# Patient Record
Sex: Female | Born: 1985 | Race: White | Hispanic: No | Marital: Single | Smoking: Former smoker
Health system: Southern US, Community
[De-identification: ages and names within clinical notes are randomized; demographics above are authoritative.]

## PROBLEM LIST (undated history)

## (undated) DIAGNOSIS — Z8679 Personal history of other diseases of the circulatory system: Secondary | ICD-10-CM

## (undated) DIAGNOSIS — R87629 Unspecified abnormal cytological findings in specimens from vagina: Secondary | ICD-10-CM

## (undated) DIAGNOSIS — M5117 Intervertebral disc disorders with radiculopathy, lumbosacral region: Secondary | ICD-10-CM

## (undated) DIAGNOSIS — O24419 Gestational diabetes mellitus in pregnancy, unspecified control: Secondary | ICD-10-CM

## (undated) DIAGNOSIS — F319 Bipolar disorder, unspecified: Secondary | ICD-10-CM

## (undated) DIAGNOSIS — F419 Anxiety disorder, unspecified: Secondary | ICD-10-CM

## (undated) DIAGNOSIS — J45909 Unspecified asthma, uncomplicated: Secondary | ICD-10-CM

## (undated) DIAGNOSIS — M9901 Segmental and somatic dysfunction of cervical region: Secondary | ICD-10-CM

## (undated) DIAGNOSIS — Z8759 Personal history of other complications of pregnancy, childbirth and the puerperium: Secondary | ICD-10-CM

## (undated) DIAGNOSIS — I83819 Varicose veins of unspecified lower extremities with pain: Secondary | ICD-10-CM

## (undated) HISTORY — DX: Intervertebral disc disorders with radiculopathy, lumbosacral region: M51.17

## (undated) HISTORY — DX: Segmental and somatic dysfunction of cervical region: M99.01

## (undated) HISTORY — DX: Unspecified asthma, uncomplicated: J45.909

## (undated) HISTORY — DX: Bipolar disorder, unspecified: F31.9

## (undated) HISTORY — DX: Unspecified abnormal cytological findings in specimens from vagina: R87.629

## (undated) HISTORY — DX: Varicose veins of unspecified lower extremity with pain: I83.819

## (undated) HISTORY — DX: Anxiety disorder, unspecified: F41.9

## (undated) HISTORY — PX: LEEP: SHX91

---
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
## 2008-01-21 ENCOUNTER — Other Ambulatory Visit: Admission: RE | Admit: 2008-01-21 | Discharge: 2008-01-21 | Payer: Self-pay | Admitting: Unknown Physician Specialty

## 2008-01-21 ENCOUNTER — Encounter (INDEPENDENT_AMBULATORY_CARE_PROVIDER_SITE_OTHER): Payer: Self-pay | Admitting: Unknown Physician Specialty

## 2008-10-20 ENCOUNTER — Other Ambulatory Visit: Admission: RE | Admit: 2008-10-20 | Discharge: 2008-10-20 | Payer: Self-pay | Admitting: Unknown Physician Specialty

## 2008-10-20 ENCOUNTER — Encounter (INDEPENDENT_AMBULATORY_CARE_PROVIDER_SITE_OTHER): Payer: Self-pay | Admitting: Unknown Physician Specialty

## 2009-02-20 ENCOUNTER — Emergency Department (HOSPITAL_COMMUNITY): Admission: EM | Admit: 2009-02-20 | Discharge: 2009-02-20 | Payer: Self-pay | Admitting: Emergency Medicine

## 2009-04-12 ENCOUNTER — Ambulatory Visit (HOSPITAL_COMMUNITY): Admission: RE | Admit: 2009-04-12 | Discharge: 2009-04-12 | Payer: Self-pay | Admitting: Pulmonary Disease

## 2009-11-14 ENCOUNTER — Emergency Department (HOSPITAL_COMMUNITY): Admission: EM | Admit: 2009-11-14 | Discharge: 2009-11-14 | Payer: Self-pay | Admitting: Emergency Medicine

## 2010-01-30 IMAGING — CR DG TOE GREAT 2+V*L*
1 series · 1 of 1 positions shown · non-contrast
Comparison: None available.

CLINICAL DATA: Fall, pain.

LEFT TOE - 2+ VIEW

[view not recorded]
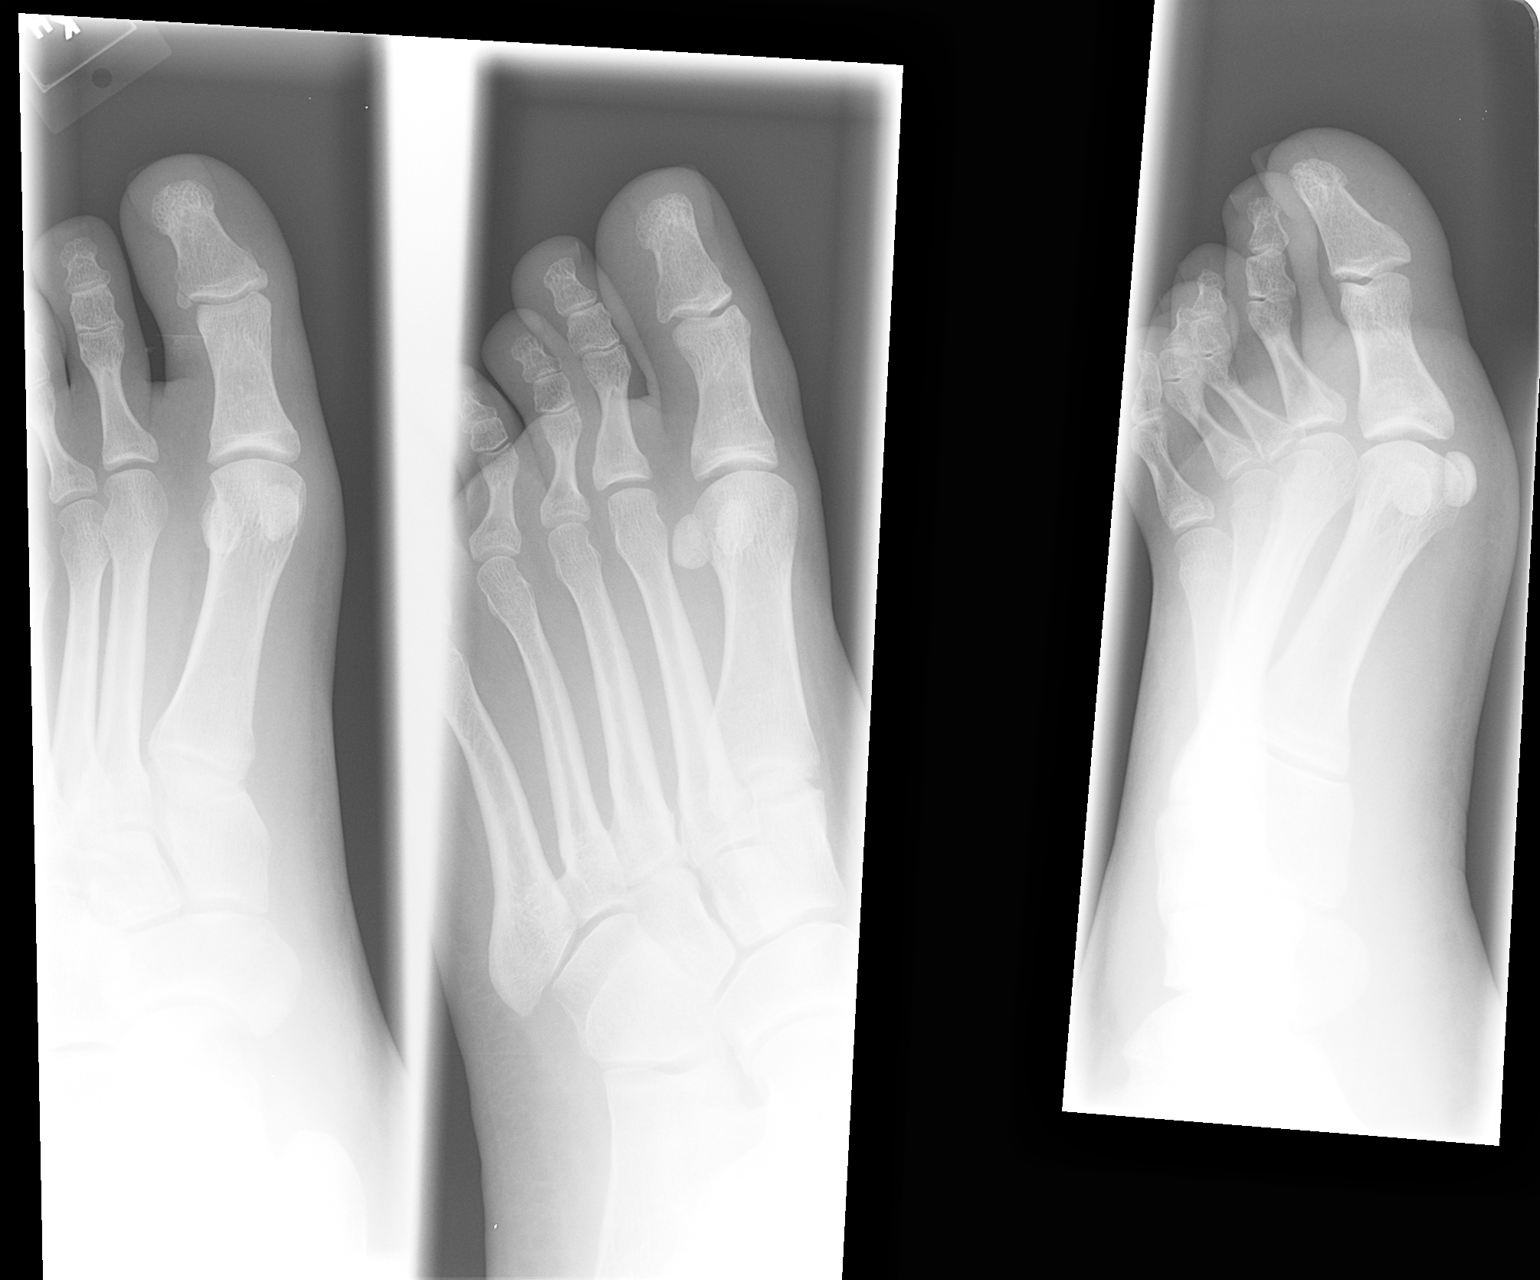

[1 of 1 positions shown; findings below may reference images not displayed]

FINDINGS: There is a nondisplaced fracture of the base of the
distal phalanx of the great toe on the lateral side.  The fracture
involves a small corner of the proximal phalanx.  No other acute
bony or joint abnormality.
IMPRESSION: Nondisplaced fracture lateral corner base of the proximal phalanx
of the great toe.

## 2010-04-25 ENCOUNTER — Emergency Department (HOSPITAL_COMMUNITY): Admission: EM | Admit: 2010-04-25 | Discharge: 2010-04-25 | Payer: Self-pay | Admitting: Emergency Medicine

## 2010-09-13 ENCOUNTER — Emergency Department (HOSPITAL_COMMUNITY)
Admission: EM | Admit: 2010-09-13 | Discharge: 2010-09-13 | Disposition: A | Discharge status: Home / Self Care | Payer: Self-pay | Attending: Emergency Medicine | Admitting: Emergency Medicine

## 2010-09-13 DIAGNOSIS — L299 Pruritus, unspecified: Secondary | ICD-10-CM | POA: Insufficient documentation

## 2010-09-13 DIAGNOSIS — B86 Scabies: Secondary | ICD-10-CM | POA: Insufficient documentation

## 2014-01-13 ENCOUNTER — Encounter (HOSPITAL_COMMUNITY): Payer: Self-pay | Admitting: Emergency Medicine

## 2014-01-13 ENCOUNTER — Emergency Department (HOSPITAL_COMMUNITY)
Admission: EM | Admit: 2014-01-13 | Discharge: 2014-01-13 | Disposition: A | Discharge status: Home / Self Care | Payer: Medicaid Other | Attending: Emergency Medicine | Admitting: Emergency Medicine

## 2014-01-13 DIAGNOSIS — IMO0002 Reserved for concepts with insufficient information to code with codable children: Secondary | ICD-10-CM | POA: Diagnosis not present

## 2014-01-13 DIAGNOSIS — A599 Trichomoniasis, unspecified: Secondary | ICD-10-CM

## 2014-01-13 DIAGNOSIS — F17200 Nicotine dependence, unspecified, uncomplicated: Secondary | ICD-10-CM | POA: Diagnosis not present

## 2014-01-13 DIAGNOSIS — Z87828 Personal history of other (healed) physical injury and trauma: Secondary | ICD-10-CM | POA: Insufficient documentation

## 2014-01-13 DIAGNOSIS — N730 Acute parametritis and pelvic cellulitis: Secondary | ICD-10-CM

## 2014-01-13 DIAGNOSIS — M5416 Radiculopathy, lumbar region: Secondary | ICD-10-CM

## 2014-01-13 DIAGNOSIS — Z792 Long term (current) use of antibiotics: Secondary | ICD-10-CM | POA: Diagnosis not present

## 2014-01-13 DIAGNOSIS — Z79899 Other long term (current) drug therapy: Secondary | ICD-10-CM | POA: Diagnosis not present

## 2014-01-13 DIAGNOSIS — N739 Female pelvic inflammatory disease, unspecified: Secondary | ICD-10-CM | POA: Insufficient documentation

## 2014-01-13 DIAGNOSIS — Z3202 Encounter for pregnancy test, result negative: Secondary | ICD-10-CM | POA: Diagnosis not present

## 2014-01-13 DIAGNOSIS — M549 Dorsalgia, unspecified: Secondary | ICD-10-CM | POA: Diagnosis present

## 2014-01-13 LAB — URINALYSIS, ROUTINE W REFLEX MICROSCOPIC
BILIRUBIN URINE: NEGATIVE
Glucose, UA: NEGATIVE mg/dL
KETONES UR: NEGATIVE mg/dL
NITRITE: NEGATIVE
Protein, ur: 30 mg/dL — AB
UROBILINOGEN UA: 0.2 mg/dL (ref 0.0–1.0)
pH: 6 (ref 5.0–8.0)

## 2014-01-13 LAB — URINE MICROSCOPIC-ADD ON

## 2014-01-13 LAB — WET PREP, GENITAL: YEAST WET PREP: NONE SEEN

## 2014-01-13 LAB — PREGNANCY, URINE: PREG TEST UR: NEGATIVE

## 2014-01-13 MED ORDER — METRONIDAZOLE 500 MG PO TABS: 500.0000 mg | ORAL_TABLET | Freq: Two times a day (BID) | ORAL | Status: DC

## 2014-01-13 MED ORDER — AZITHROMYCIN 1 G PO PACK
2.0000 g | PACK | Freq: Once | ORAL | Status: AC
  Administered 2014-01-13: 2 g via ORAL
  Filled 2014-01-13: qty 2

## 2014-01-13 MED ORDER — DOXYCYCLINE HYCLATE 100 MG PO CAPS: 100.0000 mg | ORAL_CAPSULE | Freq: Two times a day (BID) | ORAL | Status: DC

## 2014-01-13 MED ORDER — ONDANSETRON 8 MG PO TBDP
8.0000 mg | ORAL_TABLET | Freq: Once | ORAL | Status: AC
  Administered 2014-01-13: 8 mg via ORAL
  Filled 2014-01-13: qty 1

## 2014-01-13 MED ORDER — METRONIDAZOLE 500 MG PO TABS
2000.0000 mg | ORAL_TABLET | Freq: Once | ORAL | Status: AC
  Administered 2014-01-13: 2000 mg via ORAL
  Filled 2014-01-13: qty 4
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
## 2014-01-13 NOTE — Discharge Instructions (Signed)
SEEK IMMEDIATE MEDICAL ATTENTION IF: New numbness, tingling, weakness, or problem with the use of your arms or legs.  Severe back pain not relieved with medications.  Change in bowel or bladder control.  Increasing pain in any areas of the body (such as chest or abdominal pain).  Shortness of breath, dizziness or fainting.  Nausea (feeling sick to your stomach), vomiting, fever, or sweats.  Consider filling your prescription and using your Ultram as previously directed by your doctor.

## 2014-01-15 LAB — GC/CHLAMYDIA PROBE AMP
CT Probe RNA: NEGATIVE
GC PROBE AMP APTIMA: NEGATIVE

## 2014-06-08 ENCOUNTER — Other Ambulatory Visit: Payer: Self-pay | Admitting: Obstetrics and Gynecology

## 2014-06-08 DIAGNOSIS — O3680X0 Pregnancy with inconclusive fetal viability, not applicable or unspecified: Secondary | ICD-10-CM

## 2014-06-09 ENCOUNTER — Ambulatory Visit (INDEPENDENT_AMBULATORY_CARE_PROVIDER_SITE_OTHER): Payer: Medicaid Other

## 2014-06-09 ENCOUNTER — Other Ambulatory Visit: Payer: Self-pay | Admitting: Obstetrics and Gynecology

## 2014-06-09 DIAGNOSIS — Z3687 Encounter for antenatal screening for uncertain dates: Secondary | ICD-10-CM

## 2014-06-09 DIAGNOSIS — O3680X0 Pregnancy with inconclusive fetal viability, not applicable or unspecified: Secondary | ICD-10-CM

## 2014-06-09 DIAGNOSIS — Z36 Encounter for antenatal screening of mother: Secondary | ICD-10-CM

## 2014-06-09 NOTE — Progress Notes (Signed)
U/S-single IUP with +FCA noted, FHR-124 bpm, CRL c/w 6+2wks EDD 01/31/2015, cx appears closed, bilateral adnexa appears WNL

## 2014-06-23 ENCOUNTER — Encounter: Payer: Medicaid Other | Admitting: Women's Health

## 2014-07-02 ENCOUNTER — Encounter: Payer: Medicaid Other | Admitting: Advanced Practice Midwife

## 2014-07-14 ENCOUNTER — Encounter: Payer: Medicaid Other | Admitting: Women's Health

## 2014-07-28 ENCOUNTER — Encounter: Payer: Medicaid Other | Admitting: Women's Health

## 2014-07-28 ENCOUNTER — Encounter: Payer: Self-pay | Admitting: Women's Health

## 2016-07-06 ENCOUNTER — Encounter: Payer: Self-pay | Admitting: Vascular Surgery

## 2016-07-11 ENCOUNTER — Encounter: Payer: Self-pay | Admitting: Surgery

## 2016-07-17 ENCOUNTER — Ambulatory Visit (INDEPENDENT_AMBULATORY_CARE_PROVIDER_SITE_OTHER): Payer: Medicaid Other | Admitting: Vascular Surgery

## 2016-07-17 ENCOUNTER — Encounter: Payer: Self-pay | Admitting: Vascular Surgery

## 2016-07-17 VITALS — BP 129/88 | HR 75 | Temp 97.6°F | Resp 18 | Ht 67.0 in | Wt 298.0 lb

## 2016-07-17 DIAGNOSIS — I83892 Varicose veins of left lower extremities with other complications: Secondary | ICD-10-CM
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
## 2016-07-21 NOTE — Addendum Note (Signed)
Addended by: Burton ApleyPETTY, Patrice Matthew A on: 07/21/2016 12:08 PM   Modules accepted: Orders

## 2016-10-05 ENCOUNTER — Encounter: Payer: Self-pay | Admitting: Vascular Surgery

## 2016-10-17 ENCOUNTER — Encounter: Payer: Self-pay | Admitting: Vascular Surgery

## 2016-10-17 ENCOUNTER — Ambulatory Visit (INDEPENDENT_AMBULATORY_CARE_PROVIDER_SITE_OTHER): Payer: Medicaid Other | Admitting: Vascular Surgery

## 2016-10-17 ENCOUNTER — Ambulatory Visit (HOSPITAL_COMMUNITY)
Admission: RE | Admit: 2016-10-17 | Discharge: 2016-10-17 | Disposition: A | Discharge status: Home / Self Care | Payer: Medicaid Other | Source: Ambulatory Visit | Attending: Vascular Surgery | Admitting: Vascular Surgery

## 2016-10-17 VITALS — BP 115/76 | HR 84 | Temp 98.2°F | Resp 16 | Ht 68.0 in | Wt 299.0 lb

## 2016-10-17 DIAGNOSIS — I83892 Varicose veins of left lower extremities with other complications: Secondary | ICD-10-CM
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
## 2016-10-19 ENCOUNTER — Other Ambulatory Visit: Payer: Self-pay | Admitting: *Deleted

## 2016-10-19 DIAGNOSIS — I83892 Varicose veins of left lower extremities with other complications: Secondary | ICD-10-CM

## 2016-11-10 ENCOUNTER — Encounter: Payer: Self-pay | Admitting: Vascular Surgery

## 2016-11-15 ENCOUNTER — Encounter: Payer: Self-pay | Admitting: Vascular Surgery

## 2016-11-21 ENCOUNTER — Encounter: Payer: Self-pay | Admitting: Vascular Surgery

## 2016-11-21 ENCOUNTER — Ambulatory Visit (INDEPENDENT_AMBULATORY_CARE_PROVIDER_SITE_OTHER): Payer: Medicaid Other | Admitting: Vascular Surgery

## 2016-11-21 VITALS — BP 115/78 | HR 80 | Temp 97.8°F | Resp 16 | Ht 68.0 in | Wt 299.0 lb

## 2016-11-21 DIAGNOSIS — I83892 Varicose veins of left lower extremities with other complications: Secondary | ICD-10-CM

## 2016-11-21 HISTORY — PX: ENDOVENOUS ABLATION SAPHENOUS VEIN W/ LASER: SUR449

## 2016-11-21 NOTE — Progress Notes (Signed)
Laser Ablation Procedure    Date: 11/21/2016   Summer Terrell DOB:11/23/1985  Consent signed: Yes    Surgeon:  Dr. Quita SkyeJames D. Hart RochesterLawson  Procedure: Laser Ablation: left Greater Saphenous Vein  BP 115/78 (BP Location: Left Arm, Patient Position: Sitting, Cuff Size: Large)   Pulse 80   Temp 97.8 F (36.6 C) (Oral)   Resp 16   Ht 5\' 8"  (1.727 m)   Wt 299 lb (135.6 kg)   SpO2 96%   BMI 45.46 kg/m   Tumescent Anesthesia: 500 cc 0.9% NaCl with 50 cc Bupivacaine 0.5% HCL  and 15 cc 8.4% NaHCO3  Local Anesthesia: 25 cc Bupivacaine 0.5 % HCL  Pulsed Mode: 15 watts, 500ms delay, 1.0 duration  Total Energy: 2565 Joules             Total Pulses:  173              Total Time: 2:51    Stab Phlebectomy: >20 Sites: Thigh and Calf  Patient tolerated procedure well    Description of Procedure:  After marking the course of the secondary varicosities, the patient was placed on the operating table in the supine position, and the left leg was prepped and draped in sterile fashion.   Local anesthetic was administered and under ultrasound guidance the saphenous vein was accessed with a micro needle and guide wire; then the mirco puncture sheath was placed.  A guide wire was inserted saphenofemoral junction , followed by a 5 french sheath.  The position of the sheath and then the laser fiber below the junction was confirmed using the ultrasound.  Tumescent anesthesia was administered along the course of the saphenous vein using ultrasound guidance. The patient was placed in Trendelenburg position and protective laser glasses were placed on patient and staff, and the laser was fired at 15 watts continuous mode advancing 1-592mm/second for a total of 2565 joules.   For stab phlebectomies, local anesthetic was administered at the previously marked varicosities, and tumescent anesthesia was administered around the vessels.  Greater than 20 stab wounds were made using the tip of an 11 blade. And using the vein  hook, the phlebectomies were performed using a hemostat to avulse the varicosities.  Adequate hemostasis was achieved.     Steri strips were applied to the stab wounds and ABD pads and thigh high compression stockings were applied.  Ace wrap bandages were applied over the phlebectomy sites and at the top of the saphenofemoral junction. Blood loss was less than 15 cc.  The patient ambulated out of the operating room having tolerated the procedure well.

## 2016-11-21 NOTE — Progress Notes (Signed)
Subjective:     Patient ID: Cottie BandaAllyson L Abalos, female   DOB: 10/31/1985, 31 y.o.   MRN: [id number]  HPI This 31 year old female had laser ablation left great saphenous vein from the proximal calf to near the saphenofemoral junction plus greater than 20 stab phlebectomy of painful varicosities performed under local tumescent anesthesia. A total of 2565 J of energy was utilized. She tolerated the procedures well.  Review of Systems     Objective:   Physical Exam BP 115/78 (BP Location: Left Arm, Patient Position: Sitting, Cuff Size: Large)   Pulse 80   Temp 97.8 F (36.6 C) (Oral)   Resp 16   Ht 5\' 8"  (1.727 m)   Wt 299 lb (135.6 kg)   SpO2 96%   BMI 45.46 kg/m        Assessment:     Well-tolerated laser ablation left great saphenous vein plus greater than 20 stab phlebectomy of painful varicosities performed under local tumescent anesthesia    Plan:     Return in 1 week for venous duplex exam to confirm closure left great saphenous vein

## 2016-11-28 ENCOUNTER — Ambulatory Visit (INDEPENDENT_AMBULATORY_CARE_PROVIDER_SITE_OTHER): Payer: Self-pay | Admitting: Vascular Surgery

## 2016-11-28 ENCOUNTER — Encounter: Payer: Self-pay | Admitting: Vascular Surgery

## 2016-11-28 ENCOUNTER — Ambulatory Visit (HOSPITAL_COMMUNITY)
Admission: RE | Admit: 2016-11-28 | Discharge: 2016-11-28 | Disposition: A | Discharge status: Home / Self Care | Payer: Medicaid Other | Source: Ambulatory Visit | Attending: Vascular Surgery | Admitting: Vascular Surgery

## 2016-11-28 VITALS — BP 104/62 | HR 87 | Temp 97.1°F | Resp 18 | Ht 68.0 in | Wt 299.0 lb

## 2016-11-28 DIAGNOSIS — I82812 Embolism and thrombosis of superficial veins of left lower extremities: Secondary | ICD-10-CM | POA: Insufficient documentation

## 2016-11-28 DIAGNOSIS — I83892 Varicose veins of left lower extremities with other complications: Secondary | ICD-10-CM | POA: Diagnosis not present

## 2016-11-28 NOTE — Progress Notes (Signed)
Subjective:     Patient ID: Summer Terrell, female   DOB: 11/09/1985, 31 y.o.   MRN: [id number]  HPI This 31 year old female returns 1 week post-laser ablation left great saphenous vein plus greater than 20 stab phlebectomy of painful varicosities for gross reflux with pain. She has had some mild-to-moderate discomfort in the distal thigh medially over the great saphenous vein. She has had no chest pain, shortness of breath, hemoptysis, or other pulmonary symptoms. She has worn her elastic compression stocking although it is irritated her proximal thigh area because of the size of her leg. She's had no change in distal edema. She did take ibuprofen as instructed.  Review of Systems     Objective:   Physical Exam BP 104/62 (BP Location: Left Arm, Patient Position: Sitting, Cuff Size: Large)   Pulse 87   Temp 97.1 F (36.2 C) (Oral)   Resp 18   Ht 5\' 8"  (1.727 m)   Wt 299 lb (135.6 kg)   SpO2 96%   BMI 45.46 kg/m   Gen. well-developed well-nourished female no apparent distress alert and oriented 3 Lungs no rhonchi or wheezing Cardiovascular regular rhythm no murmurs Left leg with Steri-Strips in place over multiple stab phlebectomy sites and thigh and calf. All healing nicely. No distal edema noted. 2+ dorsalis pedis pulse palpable. Mild tenderness to deep palpation over great saphenous vein in mid to distal thigh.  Today I ordered a venous duplex exam the left leg which I reviewed and interpreted. There is no DVT. There is total closure of the left great saphenous vein from the proximal calf to near the saphenofemoral junction     Assessment:     Successful laser ablation left great saphenous vein for gross reflux with painful varicosities plus multiple stab phlebectomy of painful varicosities with excellent early results and no complaints    Plan:     Patient return to see me on a when necessary basis Will wear elastic compression stockings for 1 more week

## 2016-11-29 ENCOUNTER — Telehealth: Payer: Self-pay | Admitting: *Deleted

## 2016-11-29 NOTE — Telephone Encounter (Signed)
I cannot due to her large pain medication and anxiety meds.

## 2016-11-30 NOTE — Telephone Encounter (Signed)
Patient aware and states she seen [facility] before. States her doses have been lowered. Norco 10-325 110 pills and month and xanax 1mg  60 a month. Please advise.

## 2016-12-04 NOTE — Telephone Encounter (Signed)
They will have to go lower, or has to go to psychiatry for the anxiety meds or pain center for chronic pain meds.

## 2016-12-19 NOTE — Telephone Encounter (Signed)
Detailed message left for patient to call us back if she still wants to discuss being seen.

## 2016-12-29 ENCOUNTER — Emergency Department (HOSPITAL_COMMUNITY)
Admission: EM | Admit: 2016-12-29 | Discharge: 2016-12-29 | Disposition: A | Discharge status: Home / Self Care | Payer: Medicaid Other | Attending: Emergency Medicine | Admitting: Emergency Medicine

## 2016-12-29 ENCOUNTER — Encounter (HOSPITAL_COMMUNITY): Payer: Self-pay | Admitting: Emergency Medicine

## 2016-12-29 DIAGNOSIS — J45909 Unspecified asthma, uncomplicated: Secondary | ICD-10-CM | POA: Diagnosis not present

## 2016-12-29 DIAGNOSIS — F17200 Nicotine dependence, unspecified, uncomplicated: Secondary | ICD-10-CM | POA: Insufficient documentation

## 2016-12-29 DIAGNOSIS — N760 Acute vaginitis: Secondary | ICD-10-CM | POA: Diagnosis not present

## 2016-12-29 DIAGNOSIS — R300 Dysuria: Secondary | ICD-10-CM

## 2016-12-29 DIAGNOSIS — Z79899 Other long term (current) drug therapy: Secondary | ICD-10-CM | POA: Insufficient documentation

## 2016-12-29 DIAGNOSIS — B9689 Other specified bacterial agents as the cause of diseases classified elsewhere: Secondary | ICD-10-CM

## 2016-12-29 LAB — URINALYSIS, ROUTINE W REFLEX MICROSCOPIC
BILIRUBIN URINE: NEGATIVE
GLUCOSE, UA: NEGATIVE mg/dL
HGB URINE DIPSTICK: NEGATIVE
KETONES UR: NEGATIVE mg/dL
Leukocytes, UA: NEGATIVE
NITRITE: NEGATIVE
PH: 6 (ref 5.0–8.0)
Protein, ur: NEGATIVE mg/dL
Specific Gravity, Urine: 1.006 (ref 1.005–1.030)

## 2016-12-29 LAB — WET PREP, GENITAL
Sperm: NONE SEEN
Trich, Wet Prep: NONE SEEN
Yeast Wet Prep HPF POC: NONE SEEN

## 2016-12-29 MED ORDER — METRONIDAZOLE 0.75 % EX GEL: CUTANEOUS | 0 refills | Status: DC

## 2016-12-29 NOTE — Discharge Instructions (Signed)
Your vital signs within normal limits. Your urine analysis test is negative for urinary tract infection, or kidney stones. Your wet prep suggest bacterial vaginosis. Please use metronidazole gel at bedtime for the next 5 nights. Please refrain from all sexual activity over the next 5 nights.
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
## 2016-12-29 NOTE — ED Triage Notes (Signed)
Patient complaining of burning with urination and vaginal irritation x 2 days.

## 2017-01-01 LAB — GC/CHLAMYDIA PROBE AMP (~~LOC~~) NOT AT ARMC
Chlamydia: NEGATIVE
NEISSERIA GONORRHEA: NEGATIVE

## 2020-09-24 ENCOUNTER — Other Ambulatory Visit: Payer: Self-pay

## 2020-09-24 ENCOUNTER — Ambulatory Visit (INDEPENDENT_AMBULATORY_CARE_PROVIDER_SITE_OTHER): Payer: Medicaid Other | Admitting: Adult Health

## 2020-09-24 ENCOUNTER — Encounter: Payer: Self-pay | Admitting: Adult Health

## 2020-09-24 VITALS — BP 114/70 | HR 80 | Ht 66.0 in | Wt 296.0 lb

## 2020-09-24 DIAGNOSIS — Z3201 Encounter for pregnancy test, result positive: Secondary | ICD-10-CM | POA: Diagnosis not present

## 2020-09-24 DIAGNOSIS — Z3A01 Less than 8 weeks gestation of pregnancy: Secondary | ICD-10-CM | POA: Insufficient documentation

## 2020-09-24 DIAGNOSIS — N926 Irregular menstruation, unspecified: Secondary | ICD-10-CM | POA: Diagnosis not present

## 2020-09-24 DIAGNOSIS — R110 Nausea: Secondary | ICD-10-CM

## 2020-09-24 DIAGNOSIS — O3680X0 Pregnancy with inconclusive fetal viability, not applicable or unspecified: Secondary | ICD-10-CM | POA: Diagnosis not present

## 2020-09-24 DIAGNOSIS — Z98891 History of uterine scar from previous surgery: Secondary | ICD-10-CM

## 2020-09-24 LAB — POCT URINE PREGNANCY: Preg Test, Ur: POSITIVE — AB

## 2020-09-24 MED ORDER — PROMETHAZINE HCL 25 MG PO TABS: 25.0000 mg | ORAL_TABLET | Freq: Four times a day (QID) | ORAL | 1 refills | Status: DC | PRN

## 2020-09-24 NOTE — Progress Notes (Signed)
  Subjective:     Patient ID: Summer Terrell, female   DOB: Nov 20, 1985, 35 y.o.   MRN: [id number]  HPI [name] is a 35 year old white female,single, in for UPT, has missed a period and had +HPTs.   Review of Systems +missed period,+HPT +nasuea Reviewed past medical,surgical, social and family history. Reviewed medications and allergies.     Objective:   Physical Exam BP 114/70 (BP Location: Left Arm, Patient Position: Sitting, Cuff Size: Large)   Pulse 80   Ht 5\' 6"  (1.676 m)   Wt 296 lb (134.3 kg)   LMP 07/31/2020   BMI 47.78 kg/m UPT is positive, about 7+6 weeks by LMP with EDD 05/07/21. Skin warm and dry. Neck: mid line trachea, normal thyroid, good ROM, no lymphadenopathy noted. Lungs: clear to ausculation bilaterally. Cardiovascular: regular rate and rhythm. Abdomen is soft and non tender. AA is 0 Fall risk is low PHQ 9 score is 7 GAD 7 score is 5  [facility] - 09/24/20 1226      Pregnancy Intention Screening   Does the patient want to become pregnant in the next year? N/A    Does the patient's partner want to become pregnant in the next year? N/A    Would the patient like to discuss contraceptive options today? N/A      Contraception Wrap Up   Current Method Pregnant/Seeking Pregnancy    End Method Pregnant/Seeking Pregnancy    Contraception Counseling Provided No             Assessment:     1. Missed period   2. Positive pregnancy test   3. Less than [redacted] weeks gestation of pregnancy Continue PNV  4. Encounter to determine fetal viability of pregnancy, single or unspecified fetus Return in about 3 weeks for dating 11/24/20  5. Nausea Will rx phenergan  Meds ordered this encounter  Medications  . promethazine (PHENERGAN) 25 MG tablet    Sig: Take 1 tablet (25 mg total) by mouth every 6 (six) hours as needed for nausea or vomiting.    Dispense:  30 tablet    Refill:  1    Order Specific Question:   Supervising Provider    Answer:   [place], LUTHER H [[id number]]     6. History of C-section     Plan:      Review handout on first trimester and by [facility]

## 2020-09-24 NOTE — Patient Instructions (Signed)
Obstetrics: Normal and Problem Pregnancies (7th ed., pp. 102-121). [place], [place]: Elsevier."> Textbook of Family Medicine (9th ed., pp. 365-410). [place], [place]: [employer].">  First Trimester of Pregnancy  The first trimester of pregnancy starts on the first day of your last menstrual period until the end of week 12. This is months 1 through 3 of pregnancy. A week after a sperm fertilizes an egg, the egg will implant into the wall of the uterus and begin to develop into a baby. By the end of 12 weeks, all the baby's organs will be formed and the baby will be 2-3 inches in size. Body changes during your first trimester Your body goes through many changes during pregnancy. The changes vary and generally return to normal after your baby is born. Physical changes  You may gain or lose weight.  Your breasts may begin to grow larger and become tender. The tissue that surrounds your nipples (areola) may become darker.  Dark spots or blotches (chloasma or mask of pregnancy) may develop on your face.  You may have changes in your hair. These can include thickening or thinning of your hair or changes in texture. Health changes  You may feel nauseous, and you may vomit.  You may have heartburn.  You may develop headaches.  You may develop constipation.  Your gums may bleed and may be sensitive to brushing and flossing. Other changes  You may tire easily.  You may urinate more often.  Your menstrual periods will stop.  You may have a loss of appetite.  You may develop cravings for certain kinds of food.  You may have changes in your emotions from day to day.  You may have more vivid and strange dreams. Follow these instructions at home: Medicines  Follow your health care provider's instructions regarding medicine use. Specific medicines may be either safe or unsafe to take during pregnancy. Do not take any medicines unless told to by your health care provider.  Take a  prenatal vitamin that contains at least 600 micrograms (mcg) of folic acid. Eating and drinking  Eat a healthy diet that includes fresh fruits and vegetables, whole grains, good sources of protein such as meat, eggs, or tofu, and low-fat dairy products.  Avoid raw meat and unpasteurized juice, milk, and cheese. These carry germs that can harm you and your baby.  If you feel nauseous or you vomit: ? Eat 4 or 5 small meals a day instead of 3 large meals. ? Try eating a few soda crackers. ? Drink liquids between meals instead of during meals.  You may need to take these actions to prevent or treat constipation: ? Drink enough fluid to keep your urine pale yellow. ? Eat foods that are high in fiber, such as beans, whole grains, and fresh fruits and vegetables. ? Limit foods that are high in fat and processed sugars, such as fried or sweet foods. Activity  Exercise only as directed by your health care provider. Most people can continue their usual exercise routine during pregnancy. Try to exercise for 30 minutes at least 5 days a week.  Stop exercising if you develop pain or cramping in the lower abdomen or lower back.  Avoid exercising if it is very hot or humid or if you are at high altitude.  Avoid heavy lifting.  If you choose to, you may have sex unless your health care provider tells you not to. Relieving pain and discomfort  Wear a good support bra to relieve breast   tenderness.  Rest with your legs elevated if you have leg cramps or low back pain.  If you develop bulging veins (varicose veins) in your legs: ? Wear support hose as told by your health care provider. ? Elevate your feet for 15 minutes, 3-4 times a day. ? Limit salt in your diet. Safety  Wear your seat belt at all times when driving or riding in a car.  Talk with your health care provider if someone is verbally or physically abusive to you.  Talk with your health care provider if you are feeling sad or have  thoughts of hurting yourself. Lifestyle  Do not use hot tubs, steam rooms, or saunas.  Do not douche. Do not use tampons or scented sanitary pads.  Do not use herbal remedies, alcohol, illegal drugs, or medicines that are not approved by your health care provider. Chemicals in these products can harm your baby.  Do not use any products that contain nicotine or tobacco, such as cigarettes, e-cigarettes, and chewing tobacco. If you need help quitting, ask your health care provider.  Avoid cat litter boxes and soil used by cats. These carry germs that can cause birth defects in the baby and possibly loss of the unborn baby (fetus) by miscarriage or stillbirth. General instructions  During routine prenatal visits in the first trimester, your health care provider will do a physical exam, perform necessary tests, and ask you how things are going. Keep all follow-up visits. This is important.  Ask for help if you have counseling or nutritional needs during pregnancy. Your health care provider can offer advice or refer you to specialists for help with various needs.  Schedule a dentist appointment. At home, brush your teeth with a soft toothbrush. Floss gently.  Write down your questions. Take them to your prenatal visits. Where to find more information  American Pregnancy Association: americanpregnancy.org  American College of Obstetricians and Gynecologists: acog.org/en/Womens%20Health/Pregnancy  Office on Women's Health: womenshealth.gov/pregnancy Contact a health care provider if you have:  Dizziness.  A fever.  Mild pelvic cramps, pelvic pressure, or nagging pain in the abdominal area.  Nausea, vomiting, or diarrhea that lasts for 24 hours or longer.  A bad-smelling vaginal discharge.  Pain when you urinate.  Known exposure to a contagious illness, such as chickenpox, measles, Zika virus, HIV, or hepatitis. Get help right away if you have:  Spotting or bleeding from your  vagina.  Severe abdominal cramping or pain.  Shortness of breath or chest pain.  Any kind of trauma, such as from a fall or a car crash.  New or increased pain, swelling, or redness in an arm or leg. Summary  The first trimester of pregnancy starts on the first day of your last menstrual period until the end of week 12 (months 1 through 3).  Eating 4 or 5 small meals a day rather than 3 large meals may help to relieve nausea and vomiting.  Do not use any products that contain nicotine or tobacco, such as cigarettes, e-cigarettes, and chewing tobacco. If you need help quitting, ask your health care provider.  Keep all follow-up visits. This is important. This information is not intended to replace advice given to you by your health care provider. Make sure you discuss any questions you have with your health care provider. Document Revised: 11/19/2019 Document Reviewed: 09/25/2019 Elsevier Patient Education  2021 [employer]vier Inc.  

## 2020-10-18 ENCOUNTER — Other Ambulatory Visit: Payer: Self-pay | Admitting: Adult Health

## 2020-10-18 ENCOUNTER — Ambulatory Visit (INDEPENDENT_AMBULATORY_CARE_PROVIDER_SITE_OTHER): Payer: Medicaid Other

## 2020-10-18 ENCOUNTER — Other Ambulatory Visit: Payer: Self-pay

## 2020-10-18 ENCOUNTER — Other Ambulatory Visit: Payer: Medicaid Other

## 2020-10-18 DIAGNOSIS — Z3A11 11 weeks gestation of pregnancy: Secondary | ICD-10-CM | POA: Diagnosis not present

## 2020-10-18 DIAGNOSIS — Z3682 Encounter for antenatal screening for nuchal translucency: Secondary | ICD-10-CM | POA: Diagnosis not present

## 2020-10-18 DIAGNOSIS — O3680X0 Pregnancy with inconclusive fetal viability, not applicable or unspecified: Secondary | ICD-10-CM

## 2020-10-18 DIAGNOSIS — Z1379 Encounter for other screening for genetic and chromosomal anomalies: Secondary | ICD-10-CM

## 2020-10-18 DIAGNOSIS — Z3481 Encounter for supervision of other normal pregnancy, first trimester: Secondary | ICD-10-CM

## 2020-10-18 NOTE — Progress Notes (Signed)
[place] 11+2 wks,single IUP,CRL 55.06 mm,NB present,NT 1.4 mm,normal ovaries,fhr 176 bpm

## 2020-10-20 LAB — INTEGRATED 1
Crown Rump Length: 55.1 mm
Gest. Age on Collection Date: 11.9 weeks
Maternal Age at EDD: 34.4 yr
Nuchal Translucency (NT): 1.4 mm
Number of Fetuses: 1
PAPP-A Value: 155.4 ng/mL
Weight: 308 [lb_av]

## 2020-10-20 LAB — CBC/D/PLT+RPR+RH+ABO+RUB AB...
Antibody Screen: NEGATIVE
Basophils Absolute: 0 10*3/uL (ref 0.0–0.2)
Basos: 0 %
EOS (ABSOLUTE): 0.2 10*3/uL (ref 0.0–0.4)
Eos: 2 %
HCV Ab: <0.1 s/co ratio (ref 0.0–0.9)
HIV Screen 4th Generation wRfx: NONREACTIVE
Hematocrit: 36.4 % (ref 34.0–46.6)
Hemoglobin: 11.4 g/dL (ref 11.1–15.9)
Hepatitis B Surface Ag: NEGATIVE
Immature Grans (Abs): 0.1 10*3/uL (ref 0.0–0.1)
Immature Granulocytes: 1 %
Lymphocytes Absolute: 2.7 10*3/uL (ref 0.7–3.1)
Lymphs: 23 %
MCH: 25.5 pg — ABNORMAL LOW (ref 26.6–33.0)
MCHC: 31.3 g/dL — ABNORMAL LOW (ref 31.5–35.7)
MCV: 81 fL (ref 79–97)
Monocytes Absolute: 0.5 10*3/uL (ref 0.1–0.9)
Monocytes: 4 %
Neutrophils Absolute: 8.1 10*3/uL — ABNORMAL HIGH (ref 1.4–7.0)
Neutrophils: 70 %
Platelets: 298 10*3/uL (ref 150–450)
RBC: 4.47 x10E6/uL (ref 3.77–5.28)
RDW: 13.2 % (ref 11.7–15.4)
RPR Ser Ql: NONREACTIVE
Rh Factor: POSITIVE
Rubella Antibodies, IGG: 2.01 index (ref >0.99)
WBC: 11.6 10*3/uL — ABNORMAL HIGH (ref 3.4–10.8)

## 2020-10-20 LAB — HCV INTERPRETATION

## 2020-11-04 ENCOUNTER — Encounter: Payer: Self-pay | Admitting: Women's Health

## 2020-11-04 ENCOUNTER — Ambulatory Visit (INDEPENDENT_AMBULATORY_CARE_PROVIDER_SITE_OTHER): Payer: Medicaid Other | Admitting: Women's Health

## 2020-11-04 ENCOUNTER — Ambulatory Visit: Payer: Medicaid Other | Admitting: *Deleted

## 2020-11-04 ENCOUNTER — Telehealth: Payer: Self-pay | Admitting: *Deleted

## 2020-11-04 ENCOUNTER — Other Ambulatory Visit: Payer: Self-pay | Admitting: Women's Health

## 2020-11-04 ENCOUNTER — Other Ambulatory Visit: Payer: Self-pay

## 2020-11-04 VITALS — BP 140/80 | HR 86 | Wt 306.0 lb

## 2020-11-04 DIAGNOSIS — Z3A13 13 weeks gestation of pregnancy: Secondary | ICD-10-CM

## 2020-11-04 DIAGNOSIS — Z3480 Encounter for supervision of other normal pregnancy, unspecified trimester: Secondary | ICD-10-CM | POA: Diagnosis not present

## 2020-11-04 DIAGNOSIS — Z363 Encounter for antenatal screening for malformations: Secondary | ICD-10-CM

## 2020-11-04 DIAGNOSIS — Z3481 Encounter for supervision of other normal pregnancy, first trimester: Secondary | ICD-10-CM | POA: Diagnosis not present

## 2020-11-04 DIAGNOSIS — Z3490 Encounter for supervision of normal pregnancy, unspecified, unspecified trimester: Secondary | ICD-10-CM | POA: Insufficient documentation

## 2020-11-04 DIAGNOSIS — Z98891 History of uterine scar from previous surgery: Secondary | ICD-10-CM

## 2020-11-04 LAB — POCT URINALYSIS DIPSTICK OB
Blood, UA: NEGATIVE
Glucose, UA: NEGATIVE
Ketones, UA: NEGATIVE
Leukocytes, UA: NEGATIVE
Nitrite, UA: NEGATIVE
POC,PROTEIN,UA: NEGATIVE

## 2020-11-04 MED ORDER — DOXYLAMINE-PYRIDOXINE 10-10 MG PO TBEC: DELAYED_RELEASE_TABLET | ORAL | 6 refills | Status: DC

## 2020-11-04 MED ORDER — ONDANSETRON HCL 4 MG PO TABS: 4.0000 mg | ORAL_TABLET | Freq: Three times a day (TID) | ORAL | 0 refills | Status: DC | PRN

## 2020-11-04 MED ORDER — ASPIRIN 81 MG PO TBEC: 81.0000 mg | DELAYED_RELEASE_TABLET | Freq: Every day | ORAL | 3 refills | Status: DC

## 2020-11-04 NOTE — Telephone Encounter (Signed)
Pt aware her insurance is not going to cover Diclegis so Selena Batten B sent in Zofran to [facility] in [place]. Pt voiced understanding. JSY

## 2020-11-04 NOTE — Patient Instructions (Signed)
Summer Terrell, I greatly value your feedback.  If you receive a survey following your visit with [place] today, we appreciate you taking the time to fill it out.  Thanks, [name], CNM, WHNP-BC   [facility] ([address] [place], [place] [address]) Entrance C, located off of [address] Free 24/7 valet parking   Nausea & Vomiting  Have saltine crackers or pretzels by your bed and eat a few bites before you raise your head out of bed in the morning  Eat small frequent meals throughout the day instead of large meals  Drink plenty of fluids throughout the day to stay hydrated, just don't drink a lot of fluids with your meals.  This can make your stomach fill up faster making you feel sick  Do not brush your teeth right after you eat  Products with real ginger are good for nausea, like ginger ale and ginger hard candy Make sure it says made with real ginger!  Sucking on sour candy like lemon heads is also good for nausea  If your prenatal vitamins make you nauseated, take them at night so you will sleep through the nausea  Sea Bands  If you feel like you need medicine for the nausea & vomiting please let us know  If you are unable to keep any fluids or food down please let us know   Constipation  Drink plenty of fluid, preferably water, throughout the day  Eat foods high in fiber such as fruits, vegetables, and grains  Exercise, such as walking, is a good way to keep your bowels regular  Drink warm fluids, especially warm prune juice, or decaf coffee  Eat a 1/2 cup of real oatmeal (not instant), 1/2 cup applesauce, and 1/2-1 cup warm prune juice every day  If needed, you may take Colace (docusate sodium) stool softener once or twice a day to help keep the stool soft.   If you still are having problems with constipation, you may take Miralax once daily as needed to help keep your bowels regular.   Home Blood Pressure Monitoring for Patients    Your provider has recommended that you check your blood pressure (BP) at least once a week at home. If you do not have a blood pressure cuff at home, one will be provided for you. Contact your provider if you have not received your monitor within 1 week.   Helpful Tips for Accurate Home Blood Pressure Checks  . Don't smoke, exercise, or drink caffeine 30 minutes before checking your BP . Use the restroom before checking your BP (a full bladder can raise your pressure) . Relax in a comfortable upright chair . Feet on the ground . Left arm resting comfortably on a flat surface at the level of your heart . Legs uncrossed . Back supported . Sit quietly and don't talk . Place the cuff on your bare arm . Adjust snuggly, so that only two fingertips can fit between your skin and the top of the cuff . Check 2 readings separated by at least one minute . Keep a log of your BP readings . For a visual, please reference this diagram: http://ccnc.care/bpdiagram  Provider Name: [facility]     Phone: [phone]  Zone 1: ALL CLEAR  Continue to monitor your symptoms:  . BP reading is less than 140 (top number) or less than 90 (bottom number)  . No right upper stomach pain . No headaches or  seeing spots . No feeling nauseated or throwing up . No swelling in face and hands  Zone 2: CAUTION Call your doctor's office for any of the following:  . BP reading is greater than 140 (top number) or greater than 90 (bottom number)  . Stomach pain under your ribs in the middle or right side . Headaches or seeing spots . Feeling nauseated or throwing up . Swelling in face and hands  Zone 3: EMERGENCY  Seek immediate medical care if you have any of the following:  . BP reading is greater than160 (top number) or greater than 110 (bottom number) . Severe headaches not improving with Tylenol . Serious difficulty catching your breath . Any worsening symptoms from Zone 2    First Trimester of  Pregnancy The first trimester of pregnancy is from week 1 until the end of week 12 (months 1 through 3). A week after a sperm fertilizes an egg, the egg will implant on the wall of the uterus. This embryo will begin to develop into a baby. Genes from you and your partner are forming the baby. The female genes determine whether the baby is a boy or a girl. At 6-8 weeks, the eyes and face are formed, and the heartbeat can be seen on ultrasound. At the end of 12 weeks, all the baby's organs are formed.  Now that you are pregnant, you will want to do everything you can to have a healthy baby. Two of the most important things are to get good prenatal care and to follow your health care provider's instructions. Prenatal care is all the medical care you receive before the baby's birth. This care will help prevent, find, and treat any problems during the pregnancy and childbirth. BODY CHANGES Your body goes through many changes during pregnancy. The changes vary from woman to woman.   You may gain or lose a couple of pounds at first.  You may feel sick to your stomach (nauseous) and throw up (vomit). If the vomiting is uncontrollable, call your health care provider.  You may tire easily.  You may develop headaches that can be relieved by medicines approved by your health care provider.  You may urinate more often. Painful urination may mean you have a bladder infection.  You may develop heartburn as a result of your pregnancy.  You may develop constipation because certain hormones are causing the muscles that push waste through your intestines to slow down.  You may develop hemorrhoids or swollen, bulging veins (varicose veins).  Your breasts may begin to grow larger and become tender. Your nipples may stick out more, and the tissue that surrounds them (areola) may become darker.  Your gums may bleed and may be sensitive to brushing and flossing.  Dark spots or blotches (chloasma, mask of pregnancy)  may develop on your face. This will likely fade after the baby is born.  Your menstrual periods will stop.  You may have a loss of appetite.  You may develop cravings for certain kinds of food.  You may have changes in your emotions from day to day, such as being excited to be pregnant or being concerned that something may go wrong with the pregnancy and baby.  You may have more vivid and strange dreams.  You may have changes in your hair. These can include thickening of your hair, rapid growth, and changes in texture. Some women also have hair loss during or after pregnancy, or hair that feels dry or thin. Your  hair will most likely return to normal after your baby is born. WHAT TO EXPECT AT YOUR PRENATAL VISITS During a routine prenatal visit:  You will be weighed to make sure you and the baby are growing normally.  Your blood pressure will be taken.  Your abdomen will be measured to track your baby's growth.  The fetal heartbeat will be listened to starting around week 10 or 12 of your pregnancy.  Test results from any previous visits will be discussed. Your health care provider may ask you:  How you are feeling.  If you are feeling the baby move.  If you have had any abnormal symptoms, such as leaking fluid, bleeding, severe headaches, or abdominal cramping.  If you have any questions. Other tests that may be performed during your first trimester include:  Blood tests to find your blood type and to check for the presence of any previous infections. They will also be used to check for low iron levels (anemia) and Rh antibodies. Later in the pregnancy, blood tests for diabetes will be done along with other tests if problems develop.  Urine tests to check for infections, diabetes, or protein in the urine.  An ultrasound to confirm the proper growth and development of the baby.  An amniocentesis to check for possible genetic problems.  Fetal screens for spina bifida and  Down syndrome.  You may need other tests to make sure you and the baby are doing well. HOME CARE INSTRUCTIONS  Medicines  Follow your health care provider's instructions regarding medicine use. Specific medicines may be either safe or unsafe to take during pregnancy.  Take your prenatal vitamins as directed.  If you develop constipation, try taking a stool softener if your health care provider approves. Diet  Eat regular, well-balanced meals. Choose a variety of foods, such as meat or vegetable-based protein, fish, milk and low-fat dairy products, vegetables, fruits, and whole grain breads and cereals. Your health care provider will help you determine the amount of weight gain that is right for you.  Avoid raw meat and uncooked cheese. These carry germs that can cause birth defects in the baby.  Eating four or five small meals rather than three large meals a day may help relieve nausea and vomiting. If you start to feel nauseous, eating a few soda crackers can be helpful. Drinking liquids between meals instead of during meals also seems to help nausea and vomiting.  If you develop constipation, eat more high-fiber foods, such as fresh vegetables or fruit and whole grains. Drink enough fluids to keep your urine clear or pale yellow. Activity and Exercise  Exercise only as directed by your health care provider. Exercising will help you:  Control your weight.  Stay in shape.  Be prepared for labor and delivery.  Experiencing pain or cramping in the lower abdomen or low back is a good sign that you should stop exercising. Check with your health care provider before continuing normal exercises.  Try to avoid standing for long periods of time. Move your legs often if you must stand in one place for a long time.  Avoid heavy lifting.  Wear low-heeled shoes, and practice good posture.  You may continue to have sex unless your health care provider directs you otherwise. Relief of Pain  or Discomfort  Wear a good support bra for breast tenderness.    Take warm sitz baths to soothe any pain or discomfort caused by hemorrhoids. Use hemorrhoid cream if your health care provider  approves.    Rest with your legs elevated if you have leg cramps or low back pain.  If you develop varicose veins in your legs, wear support hose. Elevate your feet for 15 minutes, 3-4 times a day. Limit salt in your diet. Prenatal Care  Schedule your prenatal visits by the twelfth week of pregnancy. They are usually scheduled monthly at first, then more often in the last 2 months before delivery.  Write down your questions. Take them to your prenatal visits.  Keep all your prenatal visits as directed by your health care provider. Safety  Wear your seat belt at all times when driving.  Make a list of emergency phone numbers, including numbers for family, friends, the hospital, and police and fire departments. General Tips  Ask your health care provider for a referral to a local prenatal education class. Begin classes no later than at the beginning of month 6 of your pregnancy.  Ask for help if you have counseling or nutritional needs during pregnancy. Your health care provider can offer advice or refer you to specialists for help with various needs.  Do not use hot tubs, steam rooms, or saunas.  Do not douche or use tampons or scented sanitary pads.  Do not cross your legs for long periods of time.  Avoid cat litter boxes and soil used by cats. These carry germs that can cause birth defects in the baby and possibly loss of the fetus by miscarriage or stillbirth.  Avoid all smoking, herbs, alcohol, and medicines not prescribed by your health care provider. Chemicals in these affect the formation and growth of the baby.  Schedule a dentist appointment. At home, brush your teeth with a soft toothbrush and be gentle when you floss. SEEK MEDICAL CARE IF:   You have dizziness.  You have mild  pelvic cramps, pelvic pressure, or nagging pain in the abdominal area.  You have persistent nausea, vomiting, or diarrhea.  You have a bad smelling vaginal discharge.  You have pain with urination.  You notice increased swelling in your face, hands, legs, or ankles. SEEK IMMEDIATE MEDICAL CARE IF:   You have a fever.  You are leaking fluid from your vagina.  You have spotting or bleeding from your vagina.  You have severe abdominal cramping or pain.  You have rapid weight gain or loss.  You vomit blood or material that looks like coffee grounds.  You are exposed to [place] measles and have never had them.  You are exposed to fifth disease or chickenpox.  You develop a severe headache.  You have shortness of breath.  You have any kind of trauma, such as from a fall or a car accident. Document Released: 06/06/2001 Document Revised: 10/27/2013 Document Reviewed: 04/22/2013 [facility] Patient Information 2015 [place], [place]. This information is not intended to replace advice given to you by your health care provider. Make sure you discuss any questions you have with your health care provider.

## 2020-11-04 NOTE — Progress Notes (Signed)
INITIAL OBSTETRICAL VISIT Patient name: Summer Terrell MRN [id number]  Date of birth: Jul 17, 1985 Chief Complaint:   Initial Prenatal Visit (Rash on next, nausea/vomiting)  History of Present Illness:   Summer Terrell is a 35 y.o. G79P1011 Caucasian female at [redacted]w[redacted]d by LMP c/w u/s at 12 weeks with an Estimated Date of Delivery: 05/07/21 being seen today for her initial obstetrical visit.   Her obstetrical history is significant for term C/S for NRFHR during 2nd stage, then SAB.   Denies h/o HTN Today she reports n/v, had to go [facility] yesterday to get IVF/meds.  Depression screen [facility] 2/9 11/04/2020 09/24/2020  Decreased Interest 0 1  Down, Depressed, Hopeless 1 1  PHQ - 2 Score 1 2  Altered sleeping 0 1  Tired, decreased energy 2 2  Change in appetite 0 1  Feeling bad or failure about yourself  0 0  Trouble concentrating 0 1  Moving slowly or fidgety/restless 0 0  Suicidal thoughts 0 0  PHQ-9 Score 3 7    Patient's last menstrual period was 07/31/2020. Last pap 08/02/20. Results were: NILM w/ HRHPV not done Review of Systems:   Pertinent items are noted in HPI Denies cramping/contractions, leakage of fluid, vaginal bleeding, abnormal vaginal discharge w/ itching/odor/irritation, headaches, visual changes, shortness of breath, chest pain, abdominal pain, severe nausea/vomiting, or problems with urination or bowel movements unless otherwise stated above.  Pertinent History Reviewed:  Reviewed past medical,surgical, social, obstetrical and family history.  Reviewed problem list, medications and allergies. OB History  Gravida Para Term Preterm AB Living  3 1 1   1 1   SAB IAB Ectopic Multiple Live Births  1       1    # Outcome Date GA Lbr Len/2nd Weight Sex Delivery Anes PTL Lv  3 Current           2 SAB 2016          1 Term 11/27/11 [redacted]w[redacted]d  5 lb 4 oz (2.381 kg) F CS-LTranv EPI N LIV     Complications: Fetal Intolerance   Physical Assessment:   Vitals:   11/04/20 1019  BP:  140/80  Pulse: 86  Weight: (!) 306 lb (138.8 kg)  Body mass index is 49.39 kg/m.       Physical Examination:  General appearance - well appearing, and in no distress  Mental status - alert, oriented to person, place, and time  Psych:  She has a normal mood and affect  Skin - warm and dry, normal color, no suspicious lesions noted  Chest - effort normal, all lung fields clear to auscultation bilaterally  Heart - normal rate and regular rhythm  Abdomen - soft, nontender  Extremities:  No swelling or varicosities noted  Thin prep pap is not done  Chaperone: N/A    TODAY'S FHR: +FCA and active fetus on informal TA u/s  Results for orders placed or performed in visit on 11/04/20 (from the past 24 hour(s))  POC Urinalysis Dipstick OB   Collection Time: 11/04/20 10:52 AM  Result Value Ref Range   Color, UA     Clarity, UA     Glucose, UA Negative Negative   Bilirubin, UA     Ketones, UA neg    Spec Grav, UA     Blood, UA neg    pH, UA     POC,PROTEIN,UA Negative Negative, Trace, Small (1+), Moderate (2+), Large (3+), 4+   Urobilinogen, UA  Nitrite, UA neg    Leukocytes, UA Negative Negative   Appearance     Odor      Assessment & Plan:  1) Low-Risk Pregnancy G3P1011 at [redacted]w[redacted]d with an Estimated Date of Delivery: 05/07/21   2) Initial OB visit  3) Elevated bp> denies hx, will start ASA 81mg , check home bp's weekly, bring log to next appt. Had CBC/CMP yesterday at [facility], add PCR next visit if bp elevated  4) Prev c/s> for [facility], discussed option of VBAC, consent given. Requested records from [facility]  Meds:  Meds ordered this encounter  Medications  . Doxylamine-Pyridoxine (DICLEGIS) 10-10 MG TBEC    Sig: 2 tabs q hs, if sx persist add 1 tab q am on day 3, if sx persist add 1 tab q afternoon on day 4    Dispense:  100 tablet    Refill:  6    Order Specific Question:   Supervising Provider    Answer:   [facility] [[id number]]  . aspirin 81 MG EC tablet    Sig: Take 1 tablet  (81 mg total) by mouth daily. Swallow whole.    Dispense:  90 tablet    Refill:  3    Order Specific Question:   Supervising Provider    Answer:   [facility] H [[id number]]    Initial labs obtained Continue prenatal vitamins Reviewed n/v relief measures and warning s/s to report Reviewed recommended weight gain based on pre-gravid BMI Encouraged well-balanced diet Genetic & carrier screening discussed: requests Panorama, NT/IT and Horizon 14  Ultrasound discussed; fetal survey: requested [facility]NC completed> form faxed if has or is planning to apply for medicaid The nature of [place] for [employer] with multiple MDs and other Advanced Practice Providers was explained to patient; also emphasized that fellows, residents, and students are part of our team. Does have home bp cuff. Office bp cuff given: no. Check bp weekly, let [employer] know if consistently >140/90.   Indications for ASA therapy (per uptodate) OR Two or more of the following: Obesity (BMI>30 kg/m2) Yes Family h/o preeclampsia in mother or sister Yes  Follow-up: Return in about 1 month (around 12/06/2020) for LROB, 2nd IT, 12/08/2020, CNM, in person.   Orders Placed This Encounter  Procedures  . Urine Culture  . GC/Chlamydia Probe Amp  . IW:PYKDXIP OB Comp + 14 Wk  . Genetic Screening  . Pain Management Screening Profile (10S)  . POC Urinalysis Dipstick OB    [place] CNM, [facility] 11/04/2020 11:26 AM

## 2020-11-05 LAB — PMP SCREEN PROFILE (10S), URINE
Amphetamine Scrn, Ur: NEGATIVE ng/mL
BARBITURATE SCREEN URINE: NEGATIVE ng/mL
BENZODIAZEPINE SCREEN, URINE: NEGATIVE ng/mL
CANNABINOIDS UR QL SCN: NEGATIVE ng/mL
Cocaine (Metab) Scrn, Ur: NEGATIVE ng/mL
Creatinine(Crt), U: 249.5 mg/dL (ref 20.0–300.0)
Methadone Screen, Urine: NEGATIVE ng/mL
OXYCODONE+OXYMORPHONE UR QL SCN: NEGATIVE ng/mL
Opiate Scrn, Ur: NEGATIVE ng/mL
Ph of Urine: 6 (ref 4.5–8.9)
Phencyclidine Qn, Ur: NEGATIVE ng/mL
Propoxyphene Scrn, Ur: NEGATIVE ng/mL

## 2020-11-05 LAB — MED LIST OPTION NOT SELECTED

## 2020-11-06 LAB — URINE CULTURE

## 2020-11-07 LAB — GC/CHLAMYDIA PROBE AMP
Chlamydia trachomatis, NAA: NEGATIVE
Neisseria Gonorrhoeae by PCR: NEGATIVE

## 2020-11-26 ENCOUNTER — Other Ambulatory Visit: Payer: Self-pay | Admitting: Women's Health

## 2020-11-29 ENCOUNTER — Other Ambulatory Visit: Payer: Self-pay

## 2020-11-30 MED ORDER — ONDANSETRON HCL 4 MG PO TABS: 4.0000 mg | ORAL_TABLET | Freq: Three times a day (TID) | ORAL | 6 refills | Status: DC | PRN

## 2020-12-02 ENCOUNTER — Encounter: Payer: Self-pay | Admitting: Women's Health

## 2020-12-06 ENCOUNTER — Ambulatory Visit (INDEPENDENT_AMBULATORY_CARE_PROVIDER_SITE_OTHER): Payer: Medicaid Other | Admitting: Women's Health

## 2020-12-06 ENCOUNTER — Other Ambulatory Visit: Payer: Self-pay

## 2020-12-06 ENCOUNTER — Ambulatory Visit (INDEPENDENT_AMBULATORY_CARE_PROVIDER_SITE_OTHER): Payer: Medicaid Other

## 2020-12-06 ENCOUNTER — Encounter: Payer: Self-pay | Admitting: Women's Health

## 2020-12-06 VITALS — BP 126/62 | HR 84 | Wt 319.4 lb

## 2020-12-06 DIAGNOSIS — Z363 Encounter for antenatal screening for malformations: Secondary | ICD-10-CM

## 2020-12-06 DIAGNOSIS — Z3481 Encounter for supervision of other normal pregnancy, first trimester: Secondary | ICD-10-CM

## 2020-12-06 DIAGNOSIS — Z1159 Encounter for screening for other viral diseases: Secondary | ICD-10-CM

## 2020-12-06 DIAGNOSIS — Z3A18 18 weeks gestation of pregnancy: Secondary | ICD-10-CM

## 2020-12-06 DIAGNOSIS — Z362 Encounter for other antenatal screening follow-up: Secondary | ICD-10-CM

## 2020-12-06 DIAGNOSIS — Z3480 Encounter for supervision of other normal pregnancy, unspecified trimester: Secondary | ICD-10-CM

## 2020-12-06 DIAGNOSIS — Z3482 Encounter for supervision of other normal pregnancy, second trimester: Secondary | ICD-10-CM

## 2020-12-06 DIAGNOSIS — Z131 Encounter for screening for diabetes mellitus: Secondary | ICD-10-CM

## 2020-12-06 DIAGNOSIS — Z1379 Encounter for other screening for genetic and chromosomal anomalies: Secondary | ICD-10-CM

## 2020-12-06 NOTE — Progress Notes (Signed)
LOW-RISK PREGNANCY VISIT Patient name: Summer Terrell MRN [id number]  Date of birth: 1986/02/23 Chief Complaint:   Routine Prenatal Visit and Pregnancy Ultrasound  History of Present Illness:   Summer Terrell is a 35 y.o. G82P1011 female at [redacted]w[redacted]d with an Estimated Date of Delivery: 05/07/21 being seen today for ongoing management of a low-risk pregnancy.   Today she reports no complaints. Contractions: Not present. Vag. Bleeding: None.  Movement: Present. denies leaking of fluid.  Depression screen [facility] 2/9 11/04/2020 09/24/2020  Decreased Interest 0 1  Down, Depressed, Hopeless 1 1  PHQ - 2 Score 1 2  Altered sleeping 0 1  Tired, decreased energy 2 2  Change in appetite 0 1  Feeling bad or failure about yourself  0 0  Trouble concentrating 0 1  Moving slowly or fidgety/restless 0 0  Suicidal thoughts 0 0  PHQ-9 Score 3 7     GAD 7 : Generalized Anxiety Score 11/04/2020 09/24/2020  Nervous, Anxious, on Edge 1 1  Control/stop worrying 1 1  Worry too much - different things 1 1  Trouble relaxing 1 1  Restless 0 0  Easily annoyed or irritable 1 1  Afraid - awful might happen 0 0  Total GAD 7 Score 5 5      Review of Systems:   Pertinent items are noted in HPI Denies abnormal vaginal discharge w/ itching/odor/irritation, headaches, visual changes, shortness of breath, chest pain, abdominal pain, severe nausea/vomiting, or problems with urination or bowel movements unless otherwise stated above. Pertinent History Reviewed:  Reviewed past medical,surgical, social, obstetrical and family history.  Reviewed problem list, medications and allergies. Physical Assessment:   Vitals:   12/06/20 1510  BP: 126/62  Pulse: 84  Weight: (!) 319 lb 6.4 oz (144.9 kg)  Body mass index is 51.55 kg/m.        Physical Examination:   General appearance: Well appearing, and in no distress  Mental status: Alert, oriented to person, place, and time  Skin: Warm & dry  Cardiovascular: Normal  heart rate noted  Respiratory: Normal respiratory effort, no distress  Abdomen: Soft, gravid, nontender  Pelvic: Cervical exam deferred         Extremities: Edema: Trace  Fetal Status: Fetal Heart Rate (bpm): 152 u/s   Movement: Present   [place] 18+2 wks,cephalic,anterior fundal placenta gr 0,normal ovaries,cx 3.3 cm,SVP 5 cm,FHR 152 bpm,EFW 282 g 92%,limited ultrasound because of pt body habitus and fetal age,please have pt come back for additional images of the heart,face,and posterior fossa.  Chaperone: N/A   No results found for this or any previous visit (from the past 24 hour(s)).  Assessment & Plan:  1) Low-risk pregnancy G3P1011 at [redacted]w[redacted]d with an Estimated Date of Delivery: 05/07/21   2) Elevated bp @ 13wks, normal today, taking ASA  3) Prev c/s   Meds: No orders of the defined types were placed in this encounter.  Labs/procedures today: 2nd IT and labs, u/s  Plan:  Continue routine obstetrical care  Next visit: prefers will be in person for u/s     Reviewed: Preterm labor symptoms and general obstetric precautions including but not limited to vaginal bleeding, contractions, leaking of fluid and fetal movement were reviewed in detail with the patient.  All questions were answered. Does have home bp cuff. Office bp cuff given: not applicable. Check bp weekly, let us know if consistently >140 and/or >90.  Follow-up: Return in about 4 weeks (around 01/03/2021) for LROB, CNM,  in person, US:OB F/U anatomy.  No future appointments.  Orders Placed This Encounter  Procedures   US OB Follow Up   INTEGRATED 2   Hepatitis C Antibody   HgB A1c   Cheral Marker CNM, [facility] 12/06/2020 3:37 PM

## 2020-12-06 NOTE — Progress Notes (Signed)
[place] 18+2 wks,cephalic,anterior fundal placenta gr 0,normal ovaries,cx 3.3 cm,SVP 5 cm,FHR 152 bpm,EFW 282 g 92%,limited ultrasound because of pt body habitus and fetal age,please have pt come back for additional images of the heart,face,and posterior fossa.

## 2020-12-06 NOTE — Patient Instructions (Signed)
Summer Terrell, thank you for choosing our office today! We appreciate the opportunity to meet your healthcare needs. You may receive a short survey by mail, e-mail, or through [employer]. If you are happy with your care we would appreciate if you could take just a few minutes to complete the survey questions. We read all of your comments and take your feedback very seriously. Thank you again for choosing our office.  Center for [employer] Team at [facility] at [facility] ([address] [place], [place] [address]) Entrance C, located off of [address] Free 24/7 valet parking  Go to [email or web address] to register for FREE online childbirth classes  Call the office [phone]) or go to [facility] if: You begin to severe cramping Your water breaks.  Sometimes it is a big gush of fluid, sometimes it is just a trickle that keeps getting your panties wet or running down your legs You have vaginal bleeding.  It is normal to have a small amount of spotting if your cervix was checked.   [facility] Pediatricians/Family Doctors [facility]): [address] Colette Ribas, [phone]           [facility] : [facility]: [address], [phone]                [facility] Medicine [facility]): [address], [phone] (call to ask if accepting patients) [facility] Department: [address], [place], [phone]    [facility] Pediatricians/Family Doctors [facility]): [phone] [address], [phone] [facility]: [address] [place], [phone] [facility]: [address], [phone]  [facility] Doctors  [facility]): [phone] [facility]: [address], [phone]   [facility] Doctors [facility]: [address] [phone]  [facility] Doctors  [facility]: [phone], [phone]  Home Blood Pressure Monitoring for Patients   Your provider has recommended that you check your blood pressure (BP) at least once a week at home. If you do not have a blood pressure cuff at home, one will be provided for you. Contact your provider if you have not received your monitor within 1 week.   Helpful Tips for Accurate Home Blood Pressure Checks  Don't smoke, exercise, or drink caffeine 30 minutes before checking your BP Use the restroom before checking your BP (a full bladder can raise your pressure) Relax in a comfortable upright chair Feet on the ground Left arm resting comfortably on a flat surface at the level of your heart Legs uncrossed Back supported Sit quietly and don't talk Place the cuff on your bare arm Adjust snuggly, so that only two fingertips can fit between your skin and the top of the cuff Check 2 readings separated by at least one minute Keep a log of your BP readings For a visual, please reference this diagram: http://ccnc.care/bpdiagram  Provider Name: [facility]     Phone: [phone]  Zone 1: ALL CLEAR  Continue to monitor your symptoms:  BP reading is less than 140 (top number) or less than 90 (bottom number)  No right upper stomach pain No headaches or seeing spots No feeling nauseated or throwing up No swelling in face and hands  Zone 2: CAUTION Call your doctor's office for any of the following:  BP reading is greater than 140 (top number) or greater than  90 (bottom number)  Stomach pain under your ribs in the middle or right side Headaches or seeing spots Feeling nauseated or throwing up Swelling in face and hands  Zone 3: EMERGENCY  Seek immediate medical care if you have any of the following:  BP reading is greater than160 (top number) or greater than 110 (bottom number) Severe headaches not improving with Tylenol Serious difficulty catching your breath Any worsening symptoms from  Zone 2     Second Trimester of Pregnancy The second trimester is from week 14 through week 27 (months 4 through 6). The second trimester is often a time when you feel your best. Your body has adjusted to being pregnant, and you begin to feel better physically. Usually, morning sickness has lessened or quit completely, you may have more energy, and you may have an increase in appetite. The second trimester is also a time when the fetus is growing rapidly. At the end of the sixth month, the fetus is about 9 inches long and weighs about 1 pounds. You will likely begin to feel the baby move (quickening) between 16 and 20 weeks of pregnancy. Body changes during your second trimester Your body continues to go through many changes during your second trimester. The changes vary from woman to woman. Your weight will continue to increase. You will notice your lower abdomen bulging out. You may begin to get stretch marks on your hips, abdomen, and breasts. You may develop headaches that can be relieved by medicines. The medicines should be approved by your health care provider. You may urinate more often because the fetus is pressing on your bladder. You may develop or continue to have heartburn as a result of your pregnancy. You may develop constipation because certain hormones are causing the muscles that push waste through your intestines to slow down. You may develop hemorrhoids or swollen, bulging veins (varicose veins). You may have back pain. This is caused by: Weight gain. Pregnancy hormones that are relaxing the joints in your pelvis. A shift in weight and the muscles that support your balance. Your breasts will continue to grow and they will continue to become tender. Your gums may bleed and may be sensitive to brushing and flossing. Dark spots or blotches (chloasma, mask of pregnancy) may develop on your face. This will likely fade after the baby is born. A dark line from your belly button to  the pubic area (linea nigra) may appear. This will likely fade after the baby is born. You may have changes in your hair. These can include thickening of your hair, rapid growth, and changes in texture. Some women also have hair loss during or after pregnancy, or hair that feels dry or thin. Your hair will most likely return to normal after your baby is born.  What to expect at prenatal visits During a routine prenatal visit: You will be weighed to make sure you and the fetus are growing normally. Your blood pressure will be taken. Your abdomen will be measured to track your baby's growth. The fetal heartbeat will be listened to. Any test results from the previous visit will be discussed.  Your health care provider may ask you: How you are feeling. If you are feeling the baby move. If you have had any abnormal symptoms, such as leaking fluid, bleeding, severe headaches, or abdominal cramping. If you are using any tobacco products, including cigarettes, chewing tobacco, and electronic cigarettes. If you have any questions.  Other tests that may be performed during   your second trimester include: Blood tests that check for: Low iron levels (anemia). High blood sugar that affects pregnant women (gestational diabetes) between 24 and 28 weeks. Rh antibodies. This is to check for a protein on red blood cells (Rh factor). Urine tests to check for infections, diabetes, or protein in the urine. An ultrasound to confirm the proper growth and development of the baby. An amniocentesis to check for possible genetic problems. Fetal screens for spina bifida and Down syndrome. HIV (human immunodeficiency virus) testing. Routine prenatal testing includes screening for HIV, unless you choose not to have this test.  Follow these instructions at home: Medicines Follow your health care provider's instructions regarding medicine use. Specific medicines may be either safe or unsafe to take during  pregnancy. Take a prenatal vitamin that contains at least 600 micrograms (mcg) of folic acid. If you develop constipation, try taking a stool softener if your health care provider approves. Eating and drinking Eat a balanced diet that includes fresh fruits and vegetables, whole grains, good sources of protein such as meat, eggs, or tofu, and low-fat dairy. Your health care provider will help you determine the amount of weight gain that is right for you. Avoid raw meat and uncooked cheese. These carry germs that can cause birth defects in the baby. If you have low calcium intake from food, talk to your health care provider about whether you should take a daily calcium supplement. Limit foods that are high in fat and processed sugars, such as fried and sweet foods. To prevent constipation: Drink enough fluid to keep your urine clear or pale yellow. Eat foods that are high in fiber, such as fresh fruits and vegetables, whole grains, and beans. Activity Exercise only as directed by your health care provider. Most women can continue their usual exercise routine during pregnancy. Try to exercise for 30 minutes at least 5 days a week. Stop exercising if you experience uterine contractions. Avoid heavy lifting, wear low heel shoes, and practice good posture. A sexual relationship may be continued unless your health care provider directs you otherwise. Relieving pain and discomfort Wear a good support bra to prevent discomfort from breast tenderness. Take warm sitz baths to soothe any pain or discomfort caused by hemorrhoids. Use hemorrhoid cream if your health care provider approves. Rest with your legs elevated if you have leg cramps or low back pain. If you develop varicose veins, wear support hose. Elevate your feet for 15 minutes, 3-4 times a day. Limit salt in your diet. Prenatal Care Write down your questions. Take them to your prenatal visits. Keep all your prenatal visits as told by your health  care provider. This is important. Safety Wear your seat belt at all times when driving. Make a list of emergency phone numbers, including numbers for family, friends, the hospital, and police and fire departments. General instructions Ask your health care provider for a referral to a local prenatal education class. Begin classes no later than the beginning of month 6 of your pregnancy. Ask for help if you have counseling or nutritional needs during pregnancy. Your health care provider can offer advice or refer you to specialists for help with various needs. Do not use hot tubs, steam rooms, or saunas. Do not douche or use tampons or scented sanitary pads. Do not cross your legs for long periods of time. Avoid cat litter boxes and soil used by cats. These carry germs that can cause birth defects in the baby and possibly loss of the   fetus by miscarriage or stillbirth. Avoid all smoking, herbs, alcohol, and unprescribed drugs. Chemicals in these products can affect the formation and growth of the baby. Do not use any products that contain nicotine or tobacco, such as cigarettes and e-cigarettes. If you need help quitting, ask your health care provider. Visit your dentist if you have not gone yet during your pregnancy. Use a soft toothbrush to brush your teeth and be gentle when you floss. Contact a health care provider if: You have dizziness. You have mild pelvic cramps, pelvic pressure, or nagging pain in the abdominal area. You have persistent nausea, vomiting, or diarrhea. You have a bad smelling vaginal discharge. You have pain when you urinate. Get help right away if: You have a fever. You are leaking fluid from your vagina. You have spotting or bleeding from your vagina. You have severe abdominal cramping or pain. You have rapid weight gain or weight loss. You have shortness of breath with chest pain. You notice sudden or extreme swelling of your face, hands, ankles, feet, or legs. You  have not felt your baby move in over an hour. You have severe headaches that do not go away when you take medicine. You have vision changes. Summary The second trimester is from week 14 through week 27 (months 4 through 6). It is also a time when the fetus is growing rapidly. Your body goes through many changes during pregnancy. The changes vary from woman to woman. Avoid all smoking, herbs, alcohol, and unprescribed drugs. These chemicals affect the formation and growth your baby. Do not use any tobacco products, such as cigarettes, chewing tobacco, and e-cigarettes. If you need help quitting, ask your health care provider. Contact your health care provider if you have any questions. Keep all prenatal visits as told by your health care provider. This is important. This information is not intended to replace advice given to you by your health care provider. Make sure you discuss any questions you have with your health care provider. Document Released: 06/06/2001 Document Revised: 11/18/2015 Document Reviewed: 08/13/2012 Elsevier Interactive Patient Education  2017 [employer]  

## 2020-12-07 LAB — HEMOGLOBIN A1C
Est. average glucose Bld gHb Est-mCnc: 111 mg/dL
Hgb A1c MFr Bld: 5.5 % (ref 4.8–5.6)

## 2020-12-07 LAB — HEPATITIS C ANTIBODY: Hep C Virus Ab: <0.1 s/co ratio (ref 0.0–0.9)

## 2020-12-08 LAB — INTEGRATED 2
AFP MoM: 1.37
Alpha-Fetoprotein: 33.4 ng/mL
Crown Rump Length: 55.1 mm
DIA MoM: 0.96
DIA Value: 104.3 pg/mL
Estriol, Unconjugated: 0.85 ng/mL
Gest. Age on Collection Date: 11.9 weeks
Gestational Age: 18.9 weeks
Maternal Age at EDD: 34.4 yr
Nuchal Translucency (NT): 1.4 mm
Nuchal Translucency MoM: 1.14
Number of Fetuses: 1
PAPP-A MoM: 0.51
PAPP-A Value: 155.4 ng/mL
Test Results:: NEGATIVE
Weight: 308 [lb_av]
Weight: 308 [lb_av]
hCG MoM: 2.62
hCG Value: 35 IU/mL
uE3 MoM: 0.66

## 2021-01-05 ENCOUNTER — Ambulatory Visit (INDEPENDENT_AMBULATORY_CARE_PROVIDER_SITE_OTHER): Payer: Medicaid Other | Admitting: Advanced Practice Midwife

## 2021-01-05 ENCOUNTER — Other Ambulatory Visit: Payer: Self-pay

## 2021-01-05 ENCOUNTER — Ambulatory Visit (INDEPENDENT_AMBULATORY_CARE_PROVIDER_SITE_OTHER): Payer: Medicaid Other

## 2021-01-05 VITALS — BP 128/81 | HR 92 | Wt 321.4 lb

## 2021-01-05 DIAGNOSIS — Z3482 Encounter for supervision of other normal pregnancy, second trimester: Secondary | ICD-10-CM

## 2021-01-05 DIAGNOSIS — Z362 Encounter for other antenatal screening follow-up: Secondary | ICD-10-CM | POA: Diagnosis not present

## 2021-01-05 DIAGNOSIS — Z98891 History of uterine scar from previous surgery: Secondary | ICD-10-CM

## 2021-01-05 DIAGNOSIS — O99210 Obesity complicating pregnancy, unspecified trimester: Secondary | ICD-10-CM | POA: Insufficient documentation

## 2021-01-05 DIAGNOSIS — Z3A22 22 weeks gestation of pregnancy: Secondary | ICD-10-CM

## 2021-01-05 DIAGNOSIS — Z3480 Encounter for supervision of other normal pregnancy, unspecified trimester: Secondary | ICD-10-CM

## 2021-01-05 NOTE — Progress Notes (Signed)
   LOW-RISK PREGNANCY VISIT Patient name: Summer Terrell MRN [id number]  Date of birth: Aug 03, 1985 Chief Complaint:   Routine Prenatal Visit  History of Present Illness:   Summer Terrell is a 35 y.o. G70P1011 female at [redacted]w[redacted]d with an Estimated Date of Delivery: 05/07/21 being seen today for ongoing management of a low-risk pregnancy.  Today she reports  doing well . Contractions: Not present. Vag. Bleeding: None.  Movement: Present. denies leaking of fluid. Review of Systems:   Pertinent items are noted in HPI Denies abnormal vaginal discharge w/ itching/odor/irritation, headaches, visual changes, shortness of breath, chest pain, abdominal pain, severe nausea/vomiting, or problems with urination or bowel movements unless otherwise stated above. Pertinent History Reviewed:  Reviewed past medical,surgical, social, obstetrical and family history.  Reviewed problem list, medications and allergies. Physical Assessment:   Vitals:   01/05/21 1549  BP: 128/81  Pulse: 92  Weight: (!) 321 lb 6.4 oz (145.8 kg)  Body mass index is 51.88 kg/m.        Physical Examination:   General appearance: Well appearing, and in no distress  Mental status: Alert, oriented to person, place, and time  Skin: Warm & dry  Cardiovascular: Normal heart rate noted  Respiratory: Normal respiratory effort, no distress  Abdomen: Soft, gravid, nontender  Pelvic: Cervical exam deferred         Extremities: Edema: Trace  Fetal Status: Fetal Heart Rate (bpm): 144 u/s   Movement: Present    Follow up anatomy: [place] 22+4 wks,cephalic/breech,anterior placenta gr 0,normal ovaries,svp of fluid 6.2 cm,cx 3.9 cm,EFW 700 g 99.3%,anatomy complete,limited view because of pt body habitus  No results found for this or any previous visit (from the past 24 hour(s)).  Assessment & Plan:  1) Low-risk pregnancy G3P1011 at [redacted]w[redacted]d with an Estimated Date of Delivery: 05/07/21   2) At risk for macrosomia, EFW 99%; will f/u at 36wk  3) Hx  C/S, would prefer VBAC, but is concerned re fetal weight; will continue to keep an eye on, and has GDM screening next visit  4) Elevated BP without dx, normotensive today; had ^ value at 13wks (140/80)   Meds: No orders of the defined types were placed in this encounter.  Labs/procedures today: f/u u/s for anatomy  Plan:  Continue routine obstetrical care   Reviewed: Preterm labor symptoms and general obstetric precautions including but not limited to vaginal bleeding, contractions, leaking of fluid and fetal movement were reviewed in detail with the patient.  All questions were answered. Has home bp cuff.  Check bp weekly, let us know if >140/90.   Follow-up: Return in about 4 weeks (around 02/02/2021) for LROB, PN2, in person.  No orders of the defined types were placed in this encounter.  Arabella Merles CNM 01/05/2021 4:12 PM

## 2021-01-05 NOTE — Progress Notes (Signed)
[place] 22+4 wks,cephalic/breech,anterior placenta gr 0,normal ovaries,svp of fluid 6.2 cm,cx 3.9 cm,EFW 700 g 99.3%,anatomy complete,limited view because of pt body habitus

## 2021-01-05 NOTE — Patient Instructions (Signed)
Summer Terrell, I greatly value your feedback.  If you receive a survey following your visit with us today, we appreciate you taking the time to fill it out.  Thanks, Kim Cloris Flippo, CNM   You will have your sugar test next visit.  Please do not eat or drink anything after midnight the night before you come, not even water.  You will be here for at least two hours.  Please make an appointment online for the bloodwork at [email or web address] for 8:30am (or as close to this as possible). Make sure you select the [address] service center. The day of the appointment, check in with our office first, then you will go to [facility] to start the sugar test.    [facility]!!! It is now [facility] ([address] [place], [place] [address]) Entrance C, located off of [address] Free 24/7 valet parking  Go to [email or web address] to register for FREE online childbirth classes   Call the office ([phone]) or go to [facility] if: You begin to have strong, frequent contractions Your water breaks.  Sometimes it is a big gush of fluid, sometimes it is just a trickle that keeps getting your panties wet or running down your legs You have vaginal bleeding.  It is normal to have a small amount of spotting if your cervix was checked.  You don't feel your baby moving like normal.  If you don't, get you something to eat and drink and lay down and focus on feeling your baby move.   If your baby is still not moving like normal, you should call the office or go to [facility].  [place] Pediatricians/Family Doctors: [facility] [phone]           [facility] [phone]                [facility] [phone] (usually not accepting new patients unless you have family there already, you are always welcome to call and ask)      [facility] [phone]       [place] Pediatricians/Family Doctors:  [facility]: [phone] [facility]: [phone] [facility] [place]: [phone]  [place] Family Doctors:  [facility]: [phone]  [facility]: [phone]  [place] Family Doctors: [facility]: [phone]   Home Blood Pressure Monitoring for Patients   Your provider has recommended that you check your blood pressure (BP) at least once a week at home. If you do not have a blood pressure cuff at home, one will be provided for you. Contact your provider if you have not received your monitor within 1 week.   Helpful Tips for Accurate Home Blood Pressure Checks  Don't smoke, exercise, or drink caffeine 30 minutes before checking your BP Use the restroom before checking your BP (a full bladder can raise your pressure) Relax in a comfortable upright chair Feet on the ground Left arm resting comfortably on a flat surface at the level of your heart Legs uncrossed Back supported Sit quietly and don't talk Place the cuff on your bare arm Adjust snuggly, so that only two fingertips can fit between your skin and the top of the cuff Check 2 readings separated by at least one minute Keep a log of your BP readings For a visual, please reference this diagram: http://ccnc.care/bpdiagram  Provider Name: [facility]     Phone: [phone]  Zone 1: ALL CLEAR    Continue to monitor your symptoms:  BP reading is less than 140 (top number) or less than 90 (bottom number)  No right upper stomach pain No headaches or seeing spots No feeling nauseated or throwing up No swelling in face and hands  Zone 2: CAUTION Call your doctor's office for any of the following:  BP reading is greater than 140 (top number) or greater than 90 (bottom number)  Stomach pain under your ribs in the middle or right side Headaches or seeing spots Feeling nauseated or throwing up Swelling in face and hands  Zone 3: EMERGENCY  Seek  immediate medical care if you have any of the following:  BP reading is greater than160 (top number) or greater than 110 (bottom number) Severe headaches not improving with Tylenol Serious difficulty catching your breath Any worsening symptoms from Zone 2   Second Trimester of Pregnancy The second trimester is from week 13 through week 28, months 4 through 6. The second trimester is often a time when you feel your best. Your body has also adjusted to being pregnant, and you begin to feel better physically. Usually, morning sickness has lessened or quit completely, you may have more energy, and you may have an increase in appetite. The second trimester is also a time when the fetus is growing rapidly. At the end of the sixth month, the fetus is about 9 inches long and weighs about 1 pounds. You will likely begin to feel the baby move (quickening) between 18 and 20 weeks of the pregnancy. BODY CHANGES Your body goes through many changes during pregnancy. The changes vary from woman to woman.  Your weight will continue to increase. You will notice your lower abdomen bulging out. You may begin to get stretch marks on your hips, abdomen, and breasts. You may develop headaches that can be relieved by medicines approved by your health care provider. You may urinate more often because the fetus is pressing on your bladder. You may develop or continue to have heartburn as a result of your pregnancy. You may develop constipation because certain hormones are causing the muscles that push waste through your intestines to slow down. You may develop hemorrhoids or swollen, bulging veins (varicose veins). You may have back pain because of the weight gain and pregnancy hormones relaxing your joints between the bones in your pelvis and as a result of a shift in weight and the muscles that support your balance. Your breasts will continue to grow and be tender. Your gums may bleed and may be sensitive to brushing  and flossing. Dark spots or blotches (chloasma, mask of pregnancy) may develop on your face. This will likely fade after the baby is born. A dark line from your belly button to the pubic area (linea nigra) may appear. This will likely fade after the baby is born. You may have changes in your hair. These can include thickening of your hair, rapid growth, and changes in texture. Some women also have hair loss during or after pregnancy, or hair that feels dry or thin. Your hair will most likely return to normal after your baby is born. WHAT TO EXPECT AT YOUR PRENATAL VISITS During a routine prenatal visit: You will be weighed to make sure you and the fetus are growing normally. Your blood pressure will be taken. Your abdomen will be measured to track your baby's growth. The fetal heartbeat will be listened to. Any test results from the previous visit will be discussed. Your health care provider   may ask you: How you are feeling. If you are feeling the baby move. If you have had any abnormal symptoms, such as leaking fluid, bleeding, severe headaches, or abdominal cramping. If you have any questions. Other tests that may be performed during your second trimester include: Blood tests that check for: Low iron levels (anemia). Gestational diabetes (between 24 and 28 weeks). Rh antibodies. Urine tests to check for infections, diabetes, or protein in the urine. An ultrasound to confirm the proper growth and development of the baby. An amniocentesis to check for possible genetic problems. Fetal screens for spina bifida and Down syndrome. HOME CARE INSTRUCTIONS  Avoid all smoking, herbs, alcohol, and unprescribed drugs. These chemicals affect the formation and growth of the baby. Follow your health care provider's instructions regarding medicine use. There are medicines that are either safe or unsafe to take during pregnancy. Exercise only as directed by your health care provider. Experiencing  uterine cramps is a good sign to stop exercising. Continue to eat regular, healthy meals. Wear a good support bra for breast tenderness. Do not use hot tubs, steam rooms, or saunas. Wear your seat belt at all times when driving. Avoid raw meat, uncooked cheese, cat litter boxes, and soil used by cats. These carry germs that can cause birth defects in the baby. Take your prenatal vitamins. Try taking a stool softener (if your health care provider approves) if you develop constipation. Eat more high-fiber foods, such as fresh vegetables or fruit and whole grains. Drink plenty of fluids to keep your urine clear or pale yellow. Take warm sitz baths to soothe any pain or discomfort caused by hemorrhoids. Use hemorrhoid cream if your health care provider approves. If you develop varicose veins, wear support hose. Elevate your feet for 15 minutes, 3-4 times a day. Limit salt in your diet. Avoid heavy lifting, wear low heel shoes, and practice good posture. Rest with your legs elevated if you have leg cramps or low back pain. Visit your dentist if you have not gone yet during your pregnancy. Use a soft toothbrush to brush your teeth and be gentle when you floss. A sexual relationship may be continued unless your health care provider directs you otherwise. Continue to go to all your prenatal visits as directed by your health care provider. SEEK MEDICAL CARE IF:  You have dizziness. You have mild pelvic cramps, pelvic pressure, or nagging pain in the abdominal area. You have persistent nausea, vomiting, or diarrhea. You have a bad smelling vaginal discharge. You have pain with urination. SEEK IMMEDIATE MEDICAL CARE IF:  You have a fever. You are leaking fluid from your vagina. You have spotting or bleeding from your vagina. You have severe abdominal cramping or pain. You have rapid weight gain or loss. You have shortness of breath with chest pain. You notice sudden or extreme swelling of your face,  hands, ankles, feet, or legs. You have not felt your baby move in over an hour. You have severe headaches that do not go away with medicine. You have vision changes. Document Released: 06/06/2001 Document Revised: 06/17/2013 Document Reviewed: 08/13/2012 ExitCare Patient Information 2015 [employer]. This information is not intended to replace advice given to you by your health care provider. Make sure you discuss any questions you have with your health care provider.  

## 2021-02-04 ENCOUNTER — Other Ambulatory Visit: Payer: Medicaid Other

## 2021-02-04 ENCOUNTER — Encounter: Payer: Self-pay | Admitting: Obstetrics & Gynecology

## 2021-02-04 ENCOUNTER — Other Ambulatory Visit: Payer: Self-pay

## 2021-02-04 ENCOUNTER — Ambulatory Visit (INDEPENDENT_AMBULATORY_CARE_PROVIDER_SITE_OTHER): Payer: Medicaid Other | Admitting: Obstetrics & Gynecology

## 2021-02-04 VITALS — BP 124/80 | HR 95 | Wt 324.0 lb

## 2021-02-04 DIAGNOSIS — Z3482 Encounter for supervision of other normal pregnancy, second trimester: Secondary | ICD-10-CM

## 2021-02-04 DIAGNOSIS — Z3A26 26 weeks gestation of pregnancy: Secondary | ICD-10-CM

## 2021-02-04 DIAGNOSIS — R300 Dysuria: Secondary | ICD-10-CM

## 2021-02-04 LAB — POCT URINALYSIS DIPSTICK OB
Blood, UA: NEGATIVE
Glucose, UA: NEGATIVE
Ketones, UA: NEGATIVE
Nitrite, UA: NEGATIVE
POC,PROTEIN,UA: NEGATIVE

## 2021-02-04 NOTE — Progress Notes (Signed)
LOW-RISK PREGNANCY VISIT Patient name: Summer Terrell MRN [id number]  Date of birth: [birth year] Chief Complaint:   Routine Prenatal Visit  History of Present Illness:   Summer Terrell is a 35 y.o. G56P1011 female at [redacted]w[redacted]d with an Estimated Date of Delivery: 05/07/21 being seen today for ongoing management of a low-risk pregnancy.  Depression screen [facility] 2/9 02/04/2021 11/04/2020 09/24/2020  Decreased Interest 0 0 1  Down, Depressed, Hopeless 1 1 1   PHQ - 2 Score 1 1 2   Altered sleeping 1 0 1  Tired, decreased energy 1 2 2   Change in appetite 0 0 1  Feeling bad or failure about yourself  0 0 0  Trouble concentrating 0 0 1  Moving slowly or fidgety/restless 0 0 0  Suicidal thoughts 0 0 0  PHQ-9 Score 3 3 7     Today she reports no complaints. Contractions: Not present. Vag. Bleeding: None.  Movement: Present. denies leaking of fluid. Review of Systems:   Pertinent items are noted in HPI Denies abnormal vaginal discharge w/ itching/odor/irritation, headaches, visual changes, shortness of breath, chest pain, abdominal pain, severe nausea/vomiting, or problems with urination or bowel movements unless otherwise stated above. Pertinent History Reviewed:  Reviewed past medical,surgical, social, obstetrical and family history.  Reviewed problem list, medications and allergies. Physical Assessment:   Vitals:   02/04/21 0856  BP: 124/80  Pulse: 95  Weight: (!) 324 lb (147 kg)  Body mass index is 52.29 kg/m.        Physical Examination:   General appearance: Well appearing, and in no distress  Mental status: Alert, oriented to person, place, and time  Skin: Warm & dry  Cardiovascular: Normal heart rate noted  Respiratory: Normal respiratory effort, no distress  Abdomen: Soft, gravid, nontender  Pelvic: Cervical exam deferred         Extremities: Edema: Trace  Fetal Status: Fetal Heart Rate (bpm): 162 Fundal Height: 15 cm Movement: Present    Chaperone: n/a    Results for orders  placed or performed in visit on 02/04/21 (from the past 24 hour(s))  POC Urinalysis Dipstick OB   Collection Time: 02/04/21  8:52 AM  Result Value Ref Range   Color, UA     Clarity, UA     Glucose, UA Negative Negative   Bilirubin, UA     Ketones, UA neg    Spec Grav, UA     Blood, UA neg    pH, UA     POC,PROTEIN,UA Negative Negative, Trace, Small (1+), Moderate (2+), Large (3+), 4+   Urobilinogen, UA     Nitrite, UA neg    Leukocytes, UA Small (1+) (A) Negative   Appearance     Odor      Assessment & Plan:  1) Low-risk pregnancy G3P1011 at [redacted]w[redacted]d with an Estimated Date of Delivery: 05/07/21   2) Previous C section, trial TOLAC   Meds: No orders of the defined types were placed in this encounter.  Labs/procedures today: PN2  Plan:  Continue routine obstetrical care  Next visit: prefers in person    Reviewed: Preterm labor symptoms and general obstetric precautions including but not limited to vaginal bleeding, contractions, leaking of fluid and fetal movement were reviewed in detail with the patient.  All questions were answered. Has home bp cuff. Rx faxed to . Check bp weekly, let 04/06/21 know if >140/90.   Follow-up: Return in about 4 weeks (around 03/04/2021) for LROB.  Orders Placed This Encounter  Procedures  Urine Culture   POC Urinalysis Dipstick OB    Lazaro Arms, MD 02/04/2021 9:08 AM

## 2021-02-05 LAB — GLUCOSE TOLERANCE, 2 HOURS W/ 1HR
Glucose, 1 hour: 144 mg/dL (ref 65–179)
Glucose, 2 hour: 94 mg/dL (ref 65–152)
Glucose, Fasting: 90 mg/dL (ref 65–91)

## 2021-02-05 LAB — HIV ANTIBODY (ROUTINE TESTING W REFLEX): HIV Screen 4th Generation wRfx: NONREACTIVE

## 2021-02-05 LAB — CBC
Hematocrit: 30.6 % — ABNORMAL LOW (ref 34.0–46.6)
Hemoglobin: 9.9 g/dL — ABNORMAL LOW (ref 11.1–15.9)
MCH: 24.5 pg — ABNORMAL LOW (ref 26.6–33.0)
MCHC: 32.4 g/dL (ref 31.5–35.7)
MCV: 76 fL — ABNORMAL LOW (ref 79–97)
Platelets: 271 10*3/uL (ref 150–450)
RBC: 4.04 x10E6/uL (ref 3.77–5.28)
RDW: 14.4 % (ref 11.7–15.4)
WBC: 8.8 10*3/uL (ref 3.4–10.8)

## 2021-02-05 LAB — ANTIBODY SCREEN: Antibody Screen: NEGATIVE

## 2021-02-05 LAB — RPR: RPR Ser Ql: NONREACTIVE

## 2021-02-06 LAB — URINE CULTURE

## 2021-02-07 ENCOUNTER — Other Ambulatory Visit: Payer: Self-pay | Admitting: Women's Health

## 2021-02-07 MED ORDER — FERROUS SULFATE 325 (65 FE) MG PO TABS: 325.0000 mg | ORAL_TABLET | ORAL | 2 refills | Status: DC

## 2021-03-04 ENCOUNTER — Encounter: Payer: Medicaid Other | Admitting: Obstetrics & Gynecology

## 2021-03-07 ENCOUNTER — Other Ambulatory Visit: Payer: Self-pay

## 2021-03-07 ENCOUNTER — Ambulatory Visit (INDEPENDENT_AMBULATORY_CARE_PROVIDER_SITE_OTHER): Payer: Medicaid Other | Admitting: Obstetrics & Gynecology

## 2021-03-07 ENCOUNTER — Encounter: Payer: Self-pay | Admitting: Obstetrics & Gynecology

## 2021-03-07 VITALS — BP 138/93 | HR 117 | Wt 333.0 lb

## 2021-03-07 DIAGNOSIS — Z3A31 31 weeks gestation of pregnancy: Secondary | ICD-10-CM

## 2021-03-07 DIAGNOSIS — O163 Unspecified maternal hypertension, third trimester: Secondary | ICD-10-CM

## 2021-03-07 LAB — POCT URINALYSIS DIPSTICK OB
Blood, UA: NEGATIVE
Glucose, UA: NEGATIVE
Ketones, UA: NEGATIVE
Leukocytes, UA: NEGATIVE
Nitrite, UA: NEGATIVE

## 2021-03-07 NOTE — Progress Notes (Signed)
LOW-RISK PREGNANCY VISIT Patient name: Summer Terrell MRN [id number]  Date of birth: Sep 18, 1985 Chief Complaint:   Routine Prenatal Visit  History of Present Illness:   Summer Terrell is a 35 y.o. G55P1011 female at 36w2dwith an Estimated Date of Delivery: 05/07/21 being seen today for ongoing management of a low-risk pregnancy.   Prior C-secton, desires TOLAC BMI 53- on ASA daily Suspected LGA, plan to repeat [place] 36[place]   [facility] 02/04/2021 11/04/2020 09/24/2020  Decreased Interest 0 0 1  Down, Depressed, Hopeless 1 1 1   PHQ - 2 Score 1 1 2   Altered sleeping 1 0 1  Tired, decreased energy 1 2 2   Change in appetite 0 0 1  Feeling bad or failure about yourself  0 0 0  Trouble concentrating 0 0 1  Moving slowly or fidgety/restless 0 0 0  Suicidal thoughts 0 0 0  PHQ-9 Score 3 3 7     Today she reports some diarrhea/GI upset due to dinner last night.  Some mild pelvic discomfort, denies contractions Contractions: Not present. Vag. Bleeding: None.  Movement: Present. denies leaking of fluid. Review of Systems:   Pertinent items are noted in HPI Denies abnormal vaginal discharge w/ itching/odor/irritation, headaches, visual changes, shortness of breath, chest pain, severe nausea/vomiting, or problems with urination or bowel movements unless otherwise stated above. Pertinent History Reviewed:  Reviewed past medical,surgical, social, obstetrical and family history.  Reviewed problem list, medications and allergies.  Physical Assessment:   Vitals:   03/07/21 0926 03/07/21 0952  BP: 140/85 (!) 138/93  Pulse: (!) 117 (!) 117  Weight: (!) 333 lb (151 kg)   Body mass index is 53.75 kg/m.        Physical Examination:   General appearance: Well appearing, and in no distress  Mental status: Alert, oriented to person, place, and time  Skin: Warm & dry  Respiratory: Normal respiratory effort, no distress  Abdomen: Soft, gravid, nontender  Pelvic: Cervical exam  deferred         Extremities: Edema: Trace  Psych:  mood and affect appropriate  Fetal Status: Fetal Heart Rate (bpm): 155 Fundal Height: 32 cm Movement: Present    Chaperone: n/a    Results for orders placed or performed in visit on 03/07/21 (from the past 24 hour(s))  POC Urinalysis Dipstick OB   Collection Time: 03/07/21  9:34 AM  Result Value Ref Range   Color, UA     Clarity, UA     Glucose, UA Negative Negative   Bilirubin, UA     Ketones, UA neg    Spec Grav, UA     Blood, UA neg    pH, UA     POC,PROTEIN,UA Trace Negative, Trace, Small (1+), Moderate (2+), Large (3+), 4+   Urobilinogen, UA     Nitrite, UA neg    Leukocytes, UA Negative Negative   Appearance     Odor       Assessment & Plan:  1) Low-risk pregnancy G3P1011 at 332w2dith an Estimated Date of Delivery: 05/07/21   2) Elevated blood pressure -RTC on Wed for BP check -baseline labs ordered -reviewed preeclampsia precautions  3) Suspected LGA, plan to repeat [place] 36wk   Meds: No orders of the defined types were placed in this encounter.  Labs/procedures today: doppler, CBC,CMP, PC ratio  Plan:  Continue routine obstetrical care, f/u for RN visit on Wed. []  plan to repeat [place] 36wk  Next  visit: prefers in person    Reviewed: Preterm labor symptoms and general obstetric precautions including but not limited to vaginal bleeding, contractions, leaking of fluid and fetal movement were reviewed in detail with the patient.  All questions were answered. Pt has home bp cuff. Check bp weekly, let us know if >140/90.   Follow-up: Return in about 2 weeks (around 03/21/2021) for [place] visit AND Wed for BP check.  Orders Placed This Encounter  Procedures   CBC   Comp Met (CMET)   Protein / creatinine ratio, urine   POC Urinalysis Dipstick OB    Janyth Pupa, DO Attending [place], [place] for [employer], [place]

## 2021-03-08 LAB — PROTEIN / CREATININE RATIO, URINE
Creatinine, Urine: 191.9 mg/dL
Protein, Ur: 39 mg/dL
Protein/Creat Ratio: 203 mg/g creat — ABNORMAL HIGH (ref 0–200)

## 2021-03-08 LAB — CBC
Hematocrit: 32.5 % — ABNORMAL LOW (ref 34.0–46.6)
Hemoglobin: 10.5 g/dL — ABNORMAL LOW (ref 11.1–15.9)
MCH: 24.6 pg — ABNORMAL LOW (ref 26.6–33.0)
MCHC: 32.3 g/dL (ref 31.5–35.7)
MCV: 76 fL — ABNORMAL LOW (ref 79–97)
Platelets: 315 10*3/uL (ref 150–450)
RBC: 4.27 x10E6/uL (ref 3.77–5.28)
RDW: 14.6 % (ref 11.7–15.4)
WBC: 10.8 10*3/uL (ref 3.4–10.8)

## 2021-03-08 LAB — COMPREHENSIVE METABOLIC PANEL
ALT: 9 IU/L (ref 0–32)
AST: 10 IU/L (ref 0–40)
Albumin/Globulin Ratio: 1.1 — ABNORMAL LOW (ref 1.2–2.2)
Albumin: 3.1 g/dL — ABNORMAL LOW (ref 3.8–4.8)
Alkaline Phosphatase: 190 IU/L — ABNORMAL HIGH (ref 44–121)
BUN/Creatinine Ratio: 9 (ref 9–23)
BUN: 4 mg/dL — ABNORMAL LOW (ref 6–20)
Bilirubin Total: <0.2 mg/dL (ref 0.0–1.2)
CO2: 18 mmol/L — ABNORMAL LOW (ref 20–29)
Calcium: 9.2 mg/dL (ref 8.7–10.2)
Chloride: 107 mmol/L — ABNORMAL HIGH (ref 96–106)
Creatinine, Ser: 0.45 mg/dL — ABNORMAL LOW (ref 0.57–1.00)
Globulin, Total: 2.9 g/dL (ref 1.5–4.5)
Glucose: 131 mg/dL — ABNORMAL HIGH (ref 65–99)
Potassium: 4.2 mmol/L (ref 3.5–5.2)
Sodium: 139 mmol/L (ref 134–144)
Total Protein: 6 g/dL (ref 6.0–8.5)
eGFR: 129 mL/min/{1.73_m2} (ref >59)

## 2021-03-09 ENCOUNTER — Other Ambulatory Visit: Payer: Self-pay

## 2021-03-09 ENCOUNTER — Ambulatory Visit (INDEPENDENT_AMBULATORY_CARE_PROVIDER_SITE_OTHER): Payer: Medicaid Other

## 2021-03-09 VITALS — BP 140/91 | HR 95 | Wt 335.0 lb

## 2021-03-09 DIAGNOSIS — Z0130 Encounter for examination of blood pressure without abnormal findings: Secondary | ICD-10-CM

## 2021-03-09 LAB — POCT URINALYSIS DIPSTICK OB
Blood, UA: NEGATIVE
Glucose, UA: NEGATIVE
Ketones, UA: NEGATIVE
Leukocytes, UA: NEGATIVE
Nitrite, UA: NEGATIVE

## 2021-03-09 NOTE — Progress Notes (Addendum)
   NURSE VISIT- BLOOD PRESSURE CHECK  SUBJECTIVE:  Summer Terrell is a 35 y.o. G62P1011 female here for BP check. She is [redacted]w[redacted]d pregnant    HYPERTENSION ROS:  Pregnant:  Severe headaches that don't go away with tylenol/other medicines: No  Visual changes (seeing spots/double/blurred vision) No  Severe pain under right breast breast or in center of upper chest No  Severe nausea/vomiting No  Taking medicines as instructed yes  OBJECTIVE:  BP (!) 140/91   Pulse 95   Wt (!) 335 lb (152 kg)   LMP 07/31/2020   BMI 54.07 kg/m   Recheck BP 139/93 Appearance alert, well appearing, and in no distress and oriented to person, place, and time.  ASSESSMENT: Pregnancy [redacted]w[redacted]d  blood pressure check  PLAN: Discussed with Dr. Charlotta Newton   Recommendations:  Weekly BPP, monthly growth scan    Follow-up:  1 week or first available    Olney Monier A Cloteal Isaacson  03/09/2021 4:13 PM   Chart reviewed for nurse visit. Agree with plan of care.  Myna Hidalgo, DO 03/10/2021 2:23 PM

## 2021-03-16 ENCOUNTER — Other Ambulatory Visit: Payer: Self-pay | Admitting: Obstetrics & Gynecology

## 2021-03-16 DIAGNOSIS — O133 Gestational [pregnancy-induced] hypertension without significant proteinuria, third trimester: Secondary | ICD-10-CM

## 2021-03-17 ENCOUNTER — Encounter: Payer: Self-pay | Admitting: Family Medicine

## 2021-03-17 ENCOUNTER — Ambulatory Visit (INDEPENDENT_AMBULATORY_CARE_PROVIDER_SITE_OTHER): Payer: Medicaid Other | Admitting: Family Medicine

## 2021-03-17 ENCOUNTER — Other Ambulatory Visit: Payer: Self-pay

## 2021-03-17 ENCOUNTER — Ambulatory Visit (INDEPENDENT_AMBULATORY_CARE_PROVIDER_SITE_OTHER): Payer: Medicaid Other

## 2021-03-17 VITALS — BP 134/88 | HR 104 | Wt 335.4 lb

## 2021-03-17 DIAGNOSIS — O133 Gestational [pregnancy-induced] hypertension without significant proteinuria, third trimester: Secondary | ICD-10-CM | POA: Diagnosis not present

## 2021-03-17 DIAGNOSIS — O139 Gestational [pregnancy-induced] hypertension without significant proteinuria, unspecified trimester: Secondary | ICD-10-CM | POA: Insufficient documentation

## 2021-03-17 DIAGNOSIS — Z3A32 32 weeks gestation of pregnancy: Secondary | ICD-10-CM

## 2021-03-17 DIAGNOSIS — O99210 Obesity complicating pregnancy, unspecified trimester: Secondary | ICD-10-CM

## 2021-03-17 DIAGNOSIS — Z98891 History of uterine scar from previous surgery: Secondary | ICD-10-CM

## 2021-03-17 DIAGNOSIS — Z331 Pregnant state, incidental: Secondary | ICD-10-CM

## 2021-03-17 DIAGNOSIS — Z1389 Encounter for screening for other disorder: Secondary | ICD-10-CM

## 2021-03-17 DIAGNOSIS — Z3480 Encounter for supervision of other normal pregnancy, unspecified trimester: Secondary | ICD-10-CM

## 2021-03-17 LAB — POCT URINALYSIS DIPSTICK OB
Blood, UA: NEGATIVE
Glucose, UA: NEGATIVE
Ketones, UA: NEGATIVE
Leukocytes, UA: NEGATIVE
Nitrite, UA: NEGATIVE

## 2021-03-17 NOTE — Progress Notes (Signed)
    Subjective:  Summer Terrell is a 35 y.o. G3P1011 at [redacted]w[redacted]d being seen today for ongoing prenatal care.  She is currently monitored for the following issues for this high-risk pregnancy and has Varicose veins of left lower extremity with complications; History of C-section; Encounter for supervision of normal pregnancy, antepartum; Obesity in pregnancy; and Gestational hypertension on their problem list.  Patient reports she is feeling well but nervous about recent gHTN diagnosis.  Hasn't been checking her BP at home, worried she will get nervous and make her BP higher. Denies any HA, blurred vision, RUQ abdominal pain, significant LE edema. Contractions: Not present.  .  Movement: Present. Denies leaking of fluid.   The following portions of the patient's history were reviewed and updated as appropriate: allergies, current medications, past family history, past medical history, past social history, past surgical history and problem list.   Objective:   Vitals:   03/17/21 1511  BP: 134/88  Pulse: (!) 104  Weight: (!) 335 lb 6.4 oz (152.1 kg)    Fetal Status: Fetal Heart Rate (bpm): 150   Movement: Present    Fundal height difficult to assess d/t habitus.   General:  Alert, oriented and cooperative. Patient is in no acute distress.  Skin: Skin is warm and dry. No rash noted.   Cardiovascular: Normal heart rate noted  Respiratory: Normal respiratory effort, no problems with respiration noted  Abdomen: Soft, gravid, appropriate for gestational age. Pain/Pressure: Absent     Pelvic:  Cervical exam deferred        Extremities: Normal range of motion.  Edema: Trace  Mental Status: Normal mood and affect. Normal behavior. Normal judgment and thought content.    Assessment and Plan:  Pregnancy: G3P1011 at [redacted]w[redacted]d  1. Supervision of other normal pregnancy, antepartum Doing well, normal fetal movement.   2. Gestational hypertension, third trimester BP <140/90 today and asymptomatic.  Pre-e labs WNL on 9/12, consider repeating on follow up visit. BPP 8/8 today. Cont weekly BPP testing and interval U/S. Spent majority of visit providing reassurance and setting expectations today. Instructed to monitor BP at home and keep journal.    3. Obesity in pregnancy EFW 97%tile on today's [place]. Passed GTT.   4. History of C-section Desires TOLAC.   5. [redacted] weeks gestation of pregnancy   Preterm labor symptoms and general obstetric precautions including but not limited to vaginal bleeding, contractions, leaking of fluid and fetal movement were reviewed in detail with the patient. Discussed s/sx consistent with severe pre-e and to seek evaluation in MAU.   Return in about 1 week (around 03/24/2021) for BPP, HROB in 2 weeks .   Allayne Stack, DO

## 2021-03-17 NOTE — Progress Notes (Signed)
[place] 32+5 wks,breech,FHR 148 bpm,anterior placenta gr 3,AFI 11.8 cm,BPP 8/8,RI .59,..56..67=51%,EFW 2641 g 97%,limited view because of pt body habitus

## 2021-03-21 ENCOUNTER — Encounter: Payer: Medicaid Other | Admitting: Women's Health

## 2021-03-23 ENCOUNTER — Other Ambulatory Visit: Payer: Self-pay | Admitting: Obstetrics & Gynecology

## 2021-03-23 DIAGNOSIS — O133 Gestational [pregnancy-induced] hypertension without significant proteinuria, third trimester: Secondary | ICD-10-CM

## 2021-03-24 ENCOUNTER — Ambulatory Visit (INDEPENDENT_AMBULATORY_CARE_PROVIDER_SITE_OTHER): Payer: Medicaid Other | Admitting: Obstetrics & Gynecology

## 2021-03-24 ENCOUNTER — Encounter: Payer: Self-pay | Admitting: Obstetrics & Gynecology

## 2021-03-24 ENCOUNTER — Ambulatory Visit (INDEPENDENT_AMBULATORY_CARE_PROVIDER_SITE_OTHER): Payer: Medicaid Other

## 2021-03-24 ENCOUNTER — Other Ambulatory Visit: Payer: Self-pay

## 2021-03-24 VITALS — BP 139/85 | HR 79 | Wt 334.2 lb

## 2021-03-24 DIAGNOSIS — O133 Gestational [pregnancy-induced] hypertension without significant proteinuria, third trimester: Secondary | ICD-10-CM

## 2021-03-24 DIAGNOSIS — Z3480 Encounter for supervision of other normal pregnancy, unspecified trimester: Secondary | ICD-10-CM

## 2021-03-24 DIAGNOSIS — Z3A33 33 weeks gestation of pregnancy: Secondary | ICD-10-CM

## 2021-03-24 DIAGNOSIS — O99210 Obesity complicating pregnancy, unspecified trimester: Secondary | ICD-10-CM

## 2021-03-24 NOTE — Progress Notes (Signed)
HIGH-RISK PREGNANCY VISIT Patient name: Summer Terrell MRN [id number]  Date of birth: May 19, 1986 Chief Complaint:   Routine Prenatal Visit (Ultrasound)  History of Present Illness:   Summer Terrell is a 35 y.o. G52P1011 female at [redacted]w[redacted]d with an Estimated Date of Delivery: 05/07/21 being seen today for ongoing management of a high-risk pregnancy complicated by gestational hypertension currently on no meds.    Today she reports no complaints. Contractions: Not present.  .  Movement: Present. denies leaking of fluid.   Depression screen [facility] 2/9 02/04/2021 11/04/2020 09/24/2020  Decreased Interest 0 0 1  Down, Depressed, Hopeless 1 1 1   PHQ - 2 Score 1 1 2   Altered sleeping 1 0 1  Tired, decreased energy 1 2 2   Change in appetite 0 0 1  Feeling bad or failure about yourself  0 0 0  Trouble concentrating 0 0 1  Moving slowly or fidgety/restless 0 0 0  Suicidal thoughts 0 0 0  PHQ-9 Score 3 3 7      GAD 7 : Generalized Anxiety Score 02/04/2021 11/04/2020 09/24/2020  Nervous, Anxious, on Edge 1 1 1   Control/stop worrying 1 1 1   Worry too much - different things 1 1 1   Trouble relaxing 1 1 1   Restless 0 0 0  Easily annoyed or irritable 1 1 1   Afraid - awful might happen 0 0 0  Total GAD 7 Score 5 5 5      Review of Systems:   Pertinent items are noted in HPI Denies abnormal vaginal discharge w/ itching/odor/irritation, headaches, visual changes, shortness of breath, chest pain, abdominal pain, severe nausea/vomiting, or problems with urination or bowel movements unless otherwise stated above. Pertinent History Reviewed:  Reviewed past medical,surgical, social, obstetrical and family history.  Reviewed problem list, medications and allergies. Physical Assessment:   Vitals:   03/24/21 1150  BP: 139/85  Pulse: 79  Weight: (!) 334 lb 3.2 oz (151.6 kg)  Body mass index is 53.94 kg/m.           Physical Examination:   General appearance: alert, well appearing, and in no  distress  Mental status: alert, oriented to person, place, and time  Skin: warm & dry   Extremities: Edema: None    Cardiovascular: normal heart rate noted  Respiratory: normal respiratory effort, no distress  Abdomen: gravid, soft, non-tender  Pelvic: Cervical exam deferred         Fetal Status:     Movement: Present    Fetal Surveillance Testing today: BPP 8/8 with normal Dopplers   Chaperone: N/A    No results found for this or any previous visit (from the past 24 hour(s)).  Assessment & Plan:  High-risk pregnancy: G3P1011 at [redacted]w[redacted]d with an Estimated Date of Delivery: 05/07/21   1) GHTN, stable, reassuring fetal surveillance and stable BP  2) EFW 97% at 32 weeks,   Meds: No orders of the defined types were placed in this encounter.   Labs/procedures today: U/S  Treatment Plan:  twice weekly surveillance, plan IOL 37 weeks(currently breech)  Reviewed: Preterm labor symptoms and general obstetric precautions including but not limited to vaginal bleeding, contractions, leaking of fluid and fetal movement were reviewed in detail with the patient.  All questions were answered. Does have home bp cuff. Office bp cuff given: not applicable. Check bp daily, let 11/24/2020 know if consistently >140 and/or >90.  Follow-up: Return for keep scheduled.   Future Appointments  Date Time Provider Department Center  03/24/2021  12:10 PM Lazaro Arms, MD [facility]  03/31/2021  2:45 PM [facility] [place] [facility] None  03/31/2021  3:30 PM Jacklyn Shell, CNM [facility]  04/07/2021  2:45 PM [facility] [place] [facility] None  04/07/2021  3:30 PM Cheral Marker, CNM [facility]  04/14/2021  9:30 AM [facility] [place] [facility] None  04/14/2021 10:50 AM Jacklyn Shell, CNM [facility]  04/21/2021  3:00 PM [facility] [place] [facility] None  04/21/2021  3:50 PM Cheral Marker, CNM [facility]  04/28/2021  2:45 PM [facility] [place] [facility] None  04/28/2021  3:30 PM  Jacklyn Shell, CNM [facility]  05/05/2021  2:45 PM [facility] [place] [facility] None  05/05/2021  3:30 PM Myna Hidalgo, DO [facility]    No orders of the defined types were placed in this encounter.  Lazaro Arms  03/24/2021 12:07 PM

## 2021-03-24 NOTE — Progress Notes (Signed)
[place] 33+5 wks,frank breech,fhr 152 bpm,AFI 16.8 cm,RI .66,.63,.62,.60=60%,BPP 8/8,anterior placenta gr 3

## 2021-03-30 ENCOUNTER — Other Ambulatory Visit: Payer: Self-pay | Admitting: Obstetrics & Gynecology

## 2021-03-30 DIAGNOSIS — O133 Gestational [pregnancy-induced] hypertension without significant proteinuria, third trimester: Secondary | ICD-10-CM

## 2021-03-31 ENCOUNTER — Ambulatory Visit (INDEPENDENT_AMBULATORY_CARE_PROVIDER_SITE_OTHER): Payer: Medicaid Other

## 2021-03-31 ENCOUNTER — Other Ambulatory Visit: Payer: Medicaid Other

## 2021-03-31 ENCOUNTER — Ambulatory Visit (INDEPENDENT_AMBULATORY_CARE_PROVIDER_SITE_OTHER): Payer: Medicaid Other | Admitting: Advanced Practice Midwife

## 2021-03-31 ENCOUNTER — Other Ambulatory Visit: Payer: Self-pay

## 2021-03-31 ENCOUNTER — Encounter: Payer: Medicaid Other | Admitting: Advanced Practice Midwife

## 2021-03-31 VITALS — BP 143/90 | HR 94 | Wt 336.0 lb

## 2021-03-31 DIAGNOSIS — O0993 Supervision of high risk pregnancy, unspecified, third trimester: Secondary | ICD-10-CM

## 2021-03-31 DIAGNOSIS — Z3A34 34 weeks gestation of pregnancy: Secondary | ICD-10-CM

## 2021-03-31 DIAGNOSIS — O133 Gestational [pregnancy-induced] hypertension without significant proteinuria, third trimester: Secondary | ICD-10-CM

## 2021-03-31 DIAGNOSIS — F419 Anxiety disorder, unspecified: Secondary | ICD-10-CM

## 2021-03-31 DIAGNOSIS — Z3483 Encounter for supervision of other normal pregnancy, third trimester: Secondary | ICD-10-CM

## 2021-03-31 DIAGNOSIS — Z3480 Encounter for supervision of other normal pregnancy, unspecified trimester: Secondary | ICD-10-CM

## 2021-03-31 DIAGNOSIS — O99210 Obesity complicating pregnancy, unspecified trimester: Secondary | ICD-10-CM

## 2021-03-31 LAB — POCT URINALYSIS DIPSTICK OB
Blood, UA: NEGATIVE
Glucose, UA: NEGATIVE
Ketones, UA: NEGATIVE
Leukocytes, UA: NEGATIVE
Nitrite, UA: NEGATIVE

## 2021-03-31 NOTE — Progress Notes (Signed)
HIGH-RISK PREGNANCY VISIT Patient name: Summer Terrell MRN [id number]  Date of birth: October 11, 1985 Chief Complaint:   Routine Prenatal Visit, High Risk Gestation, and Pregnancy Ultrasound  History of Present Illness:   Summer Terrell is a 35 y.o. G53P1011 female at [redacted]w[redacted]d with an Estimated Date of Delivery: 05/07/21 being seen today for ongoing management of a high-risk pregnancy complicated by [facility] Today she reports normal pregnancy complaints. Contractions: Not present. Vag. Bleeding: None.  Movement: Present. denies leaking of fluid.  Review of Systems:   Pertinent items are noted in HPI Denies abnormal vaginal discharge w/ itching/odor/irritation, headaches, visual changes, shortness of breath, chest pain, abdominal pain, severe nausea/vomiting, or problems with urination or bowel movements unless otherwise stated above. Pertinent History Reviewed:  Reviewed past medical,surgical, social, obstetrical and family history.  Reviewed problem list, medications and allergies. Physical Assessment:   Vitals:   03/31/21 1410  BP: (!) 143/90  Pulse: 94  Weight: (!) 336 lb (152.4 kg)  Body mass index is 54.23 kg/m.           Physical Examination:   General appearance: alert, well appearing, and in no distress  Mental status: alert, oriented to person, place, and time  Skin: warm & dry   Extremities: Edema: Trace    Cardiovascular: normal heart rate noted  Respiratory: normal respiratory effort, no distress  Abdomen: gravid, soft, non-tender  Pelvic: Cervical exam deferred         Fetal Status:     Movement: Present    Fetal Surveillance Testing today: BPP   Results for orders placed or performed in visit on 03/31/21 (from the past 24 hour(s))  POC Urinalysis Dipstick OB   Collection Time: 03/31/21  2:14 PM  Result Value Ref Range   Color, UA     Clarity, UA     Glucose, UA Negative Negative   Bilirubin, UA     Ketones, UA neg    Spec Grav, UA     Blood, UA neg    pH, UA      POC,PROTEIN,UA Trace Negative, Trace, Small (1+), Moderate (2+), Large (3+), 4+   Urobilinogen, UA     Nitrite, UA neg    Leukocytes, UA Negative Negative   Appearance     Odor      Assessment & Plan:  1) High-risk pregnancy G3P1011 at [redacted]w[redacted]d with an Estimated Date of Delivery: 05/07/21   2) GHTN, stable Treatment plan  weekly testing, IOL 37-38 weeks.  BPs at work 124/80's  Meds: No orders of the defined types were placed in this encounter.   Labs/procedures today: BPP   Reviewed: Preterm labor symptoms and general obstetric precautions including but not limited to vaginal bleeding, contractions, leaking of fluid and fetal movement were reviewed in detail with the patient.  All questions were answered. Has home bp cuff.. Check bp weekly, let us know if >140/90.    Future Appointments  Date Time Provider Department Center  03/31/2021  2:50 PM Jacklyn Shell, CNM [facility]  04/07/2021  2:45 PM [facility] [place] [facility] None  04/07/2021  3:30 PM Cheral Marker, CNM [facility]  04/14/2021  9:00 AM [facility] [place] [facility] None  04/14/2021 10:30 AM Jacklyn Shell, CNM [facility]  04/21/2021  3:00 PM [facility] [place] [facility] None  04/21/2021  3:50 PM Cheral Marker, CNM [facility]  04/28/2021  3:30 PM Cresenzo-Dishmon, Scarlette Calico, CNM [facility]    Orders Placed This Encounter  Procedures   Ambulatory referral to Integrated Behavioral Health   POC Urinalysis Dipstick OB    Jacklyn Shell DNP, CNM 03/31/2021 2:32 PM

## 2021-03-31 NOTE — Progress Notes (Signed)
[place] 34+5 wks,cephalic,anterior placenta gr 3,BPP 8/8,AFI 17.6 cm,FHR 154 bpm,RI .70,.66,.65=81%

## 2021-04-04 NOTE — BH Specialist Note (Signed)
Pt did not arrive to video visit and did not answer the phone; Left HIPPA-compliant message to call back Abdulaziz Toman from [facility] at  [phone] (Braxton Weisbecker's office).  ?; left MyChart message for patient.  ? ?

## 2021-04-05 ENCOUNTER — Telehealth: Payer: Self-pay | Admitting: Women's Health

## 2021-04-05 NOTE — Telephone Encounter (Signed)
Pt called, concerned with numbness & tingling in her left arm down to her hand for a couple days ([redacted]w[redacted]d pregnant)  No other symptoms but concerned because she's getting weekly testing here for her BP   Please advise & notify pt

## 2021-04-06 ENCOUNTER — Other Ambulatory Visit: Payer: Self-pay | Admitting: Advanced Practice Midwife

## 2021-04-06 ENCOUNTER — Ambulatory Visit: Payer: Medicaid Other | Admitting: Clinical

## 2021-04-06 DIAGNOSIS — Z91199 Patient's noncompliance with other medical treatment and regimen due to unspecified reason: Secondary | ICD-10-CM

## 2021-04-06 DIAGNOSIS — O133 Gestational [pregnancy-induced] hypertension without significant proteinuria, third trimester: Secondary | ICD-10-CM

## 2021-04-07 ENCOUNTER — Ambulatory Visit (INDEPENDENT_AMBULATORY_CARE_PROVIDER_SITE_OTHER): Payer: Medicaid Other | Admitting: Women's Health

## 2021-04-07 ENCOUNTER — Encounter: Payer: Self-pay | Admitting: Women's Health

## 2021-04-07 ENCOUNTER — Other Ambulatory Visit: Payer: Self-pay

## 2021-04-07 ENCOUNTER — Other Ambulatory Visit (HOSPITAL_COMMUNITY)
Admission: RE | Admit: 2021-04-07 | Discharge: 2021-04-07 | Disposition: A | Discharge status: Home / Self Care | Payer: Medicaid Other | Source: Ambulatory Visit | Attending: Women's Health | Admitting: Women's Health

## 2021-04-07 ENCOUNTER — Ambulatory Visit (INDEPENDENT_AMBULATORY_CARE_PROVIDER_SITE_OTHER): Payer: Medicaid Other

## 2021-04-07 VITALS — BP 135/87 | HR 86 | Wt 339.0 lb

## 2021-04-07 DIAGNOSIS — Z3A35 35 weeks gestation of pregnancy: Secondary | ICD-10-CM | POA: Insufficient documentation

## 2021-04-07 DIAGNOSIS — O0993 Supervision of high risk pregnancy, unspecified, third trimester: Secondary | ICD-10-CM

## 2021-04-07 DIAGNOSIS — Z3480 Encounter for supervision of other normal pregnancy, unspecified trimester: Secondary | ICD-10-CM

## 2021-04-07 DIAGNOSIS — Z23 Encounter for immunization: Secondary | ICD-10-CM | POA: Diagnosis not present

## 2021-04-07 DIAGNOSIS — O133 Gestational [pregnancy-induced] hypertension without significant proteinuria, third trimester: Secondary | ICD-10-CM

## 2021-04-07 LAB — POCT URINALYSIS DIPSTICK OB
Blood, UA: NEGATIVE
Glucose, UA: NEGATIVE
Ketones, UA: NEGATIVE
Nitrite, UA: NEGATIVE
POC,PROTEIN,UA: NEGATIVE

## 2021-04-07 LAB — OB RESULTS CONSOLE GC/CHLAMYDIA: Gonorrhea: NEGATIVE

## 2021-04-07 NOTE — Patient Instructions (Signed)
Maxyne, thank you for choosing our office today! We appreciate the opportunity to meet your healthcare needs. You may receive a short survey by mail, e-mail, or through [employer]. If you are happy with your care we would appreciate if you could take just a few minutes to complete the survey questions. We read all of your comments and take your feedback very seriously. Thank you again for choosing our office.  [facility] [employer] Team at [facility]  [facility] at [facility] ([address] [place], [place] [address]) Entrance C, located off of [address] Free 24/7 valet parking   CLASSES: Go to [email or web address] to register for classes (childbirth, breastfeeding, waterbirth, infant CPR, daddy bootcamp, etc.)  Call the office [phone]) or go to [facility] if: You begin to have strong, frequent contractions Your water breaks.  Sometimes it is a big gush of fluid, sometimes it is just a trickle that keeps getting your panties wet or running down your legs You have vaginal bleeding.  It is normal to have a small amount of spotting if your cervix was checked.  You don't feel your baby moving like normal.  If you don't, get you something to eat and drink and lay down and focus on feeling your baby move.   If your baby is still not moving like normal, you should call the office or go to [facility].  Call the office [phone]) or go to [facility] hospital for these signs of pre-eclampsia: Severe headache that does not go away with Tylenol Visual changes- seeing spots, double, blurred vision Pain under your right breast or upper abdomen that does not go away with Tums or heartburn medicine Nausea and/or vomiting Severe swelling in your hands, feet, and face   Tdap Vaccine It is recommended that you get the Tdap vaccine during the third trimester of EACH pregnancy to help protect your baby from getting pertussis (whooping cough) 27-36 weeks is the BEST time to do  this so that you can pass the protection on to your baby. During pregnancy is better than after pregnancy, but if you are unable to get it during pregnancy it will be offered at the hospital.  You can get this vaccine with [place], at the health department, your family doctor, or some local pharmacies Everyone who will be around your baby should also be up-to-date on their vaccines before the baby comes. Adults (who are not pregnant) only need 1 dose of Tdap during adulthood.   [facility] Pediatricians/Family Doctors [facility]): [address] Colette Ribas, [phone]           [facility]: [address], [phone]                [facility]): [address], [phone] (call to ask if accepting patients) [facility] Department: [address], [place], [phone]    [facility] Pediatricians/Family Doctors [facility]): [phone], [phone] [facility]: [address] [place], [phone] [facility]: [address], [phone]  [facility] Doctors  [facility]): [phone] [facility]: [address] [phone]   [facility] Doctors [facility]: [address] [phone]  [facility] Family Doctors  [facility]: [phone], [phone]  Home Blood Pressure Monitoring for Patients   Your provider has recommended that you check your  blood pressure (BP) at least once a week at home. If you do not have a blood pressure cuff at home, one will be provided for you. Contact your provider if you have not received your monitor within 1 week.   Helpful Tips for Accurate Home Blood Pressure Checks  Don't smoke, exercise, or drink caffeine 30 minutes before checking your BP Use the restroom before checking your BP (a full bladder can raise your  pressure) Relax in a comfortable upright chair Feet on the ground Left arm resting comfortably on a flat surface at the level of your heart Legs uncrossed Back supported Sit quietly and don't talk Place the cuff on your bare arm Adjust snuggly, so that only two fingertips can fit between your skin and the top of the cuff Check 2 readings separated by at least one minute Keep a log of your BP readings For a visual, please reference this diagram: http://ccnc.care/bpdiagram  Provider Name: [facility]     Phone: [phone]  Zone 1: ALL CLEAR  Continue to monitor your symptoms:  BP reading is less than 140 (top number) or less than 90 (bottom number)  No right upper stomach pain No headaches or seeing spots No feeling nauseated or throwing up No swelling in face and hands  Zone 2: CAUTION Call your doctor's office for any of the following:  BP reading is greater than 140 (top number) or greater than 90 (bottom number)  Stomach pain under your ribs in the middle or right side Headaches or seeing spots Feeling nauseated or throwing up Swelling in face and hands  Zone 3: EMERGENCY  Seek immediate medical care if you have any of the following:  BP reading is greater than160 (top number) or greater than 110 (bottom number) Severe headaches not improving with Tylenol Serious difficulty catching your breath Any worsening symptoms from Zone 2  Preterm Labor and Birth Information  The normal length of a pregnancy is 39-41 weeks. Preterm labor is when labor starts before 37 completed weeks of pregnancy. What are the risk factors for preterm labor? Preterm labor is more likely to occur in women who: Have certain infections during pregnancy such as a bladder infection, sexually transmitted infection, or infection inside the uterus (chorioamnionitis). Have a shorter-than-normal cervix. Have gone into preterm labor before. Have had surgery on their cervix. Are younger than age 17  or older than age 35. Are African American. Are pregnant with twins or multiple babies (multiple gestation). Take street drugs or smoke while pregnant. Do not gain enough weight while pregnant. Became pregnant shortly after having been pregnant. What are the symptoms of preterm labor? Symptoms of preterm labor include: Cramps similar to those that can happen during a menstrual period. The cramps may happen with diarrhea. Pain in the abdomen or lower back. Regular uterine contractions that may feel like tightening of the abdomen. A feeling of increased pressure in the pelvis. Increased watery or bloody mucus discharge from the vagina. Water breaking (ruptured amniotic sac). Why is it important to recognize signs of preterm labor? It is important to recognize signs of preterm labor because babies who are born prematurely may not be fully developed. This can put them at an increased risk for: Long-term (chronic) heart and lung problems. Difficulty immediately after birth with regulating body systems, including blood sugar, body temperature, heart rate, and breathing rate. Bleeding in the brain. Cerebral palsy. Learning difficulties. Death. These risks are highest for babies who are born before 34 weeks   of pregnancy. How is preterm labor treated? Treatment depends on the length of your pregnancy, your condition, and the health of your baby. It may involve: Having a stitch (suture) placed in your cervix to prevent your cervix from opening too early (cerclage). Taking or being given medicines, such as: Hormone medicines. These may be given early in pregnancy to help support the pregnancy. Medicine to stop contractions. Medicines to help mature the baby's lungs. These may be prescribed if the risk of delivery is high. Medicines to prevent your baby from developing cerebral palsy. If the labor happens before 34 weeks of pregnancy, you may need to stay in the hospital. What should I do if I  think I am in preterm labor? If you think that you are going into preterm labor, call your health care provider right away. How can I prevent preterm labor in future pregnancies? To increase your chance of having a full-term pregnancy: Do not use any tobacco products, such as cigarettes, chewing tobacco, and e-cigarettes. If you need help quitting, ask your health care provider. Do not use street drugs or medicines that have not been prescribed to you during your pregnancy. Talk with your health care provider before taking any herbal supplements, even if you have been taking them regularly. Make sure you gain a healthy amount of weight during your pregnancy. Watch for infection. If you think that you might have an infection, get it checked right away. Make sure to tell your health care provider if you have gone into preterm labor before. This information is not intended to replace advice given to you by your health care provider. Make sure you discuss any questions you have with your health care provider. Document Revised: 10/04/2018 Document Reviewed: 11/03/2015 Elsevier Patient Education  2020 Elsevier Inc.   

## 2021-04-07 NOTE — Progress Notes (Signed)
HIGH-RISK PREGNANCY VISIT Patient name: Summer Terrell MRN [id number]  Date of birth: September 18, 1985 Chief Complaint:   High Risk Gestation ([place] today; tingling in left fingers and hand; GBS, GC/CHL)  History of Present Illness:   Summer Terrell is a 35 y.o. G18P1011 female at [redacted]w[redacted]d with an Estimated Date of Delivery: 05/07/21 being seen today for ongoing management of a high-risk pregnancy complicated by gestational hypertension currently on no meds.    Today she reports Lt carpal tunnel symptoms. Contractions: Not present. Vag. Bleeding: None.  Movement: Present. denies leaking of fluid.   Depression screen [facility] 2/9 02/04/2021 11/04/2020 09/24/2020  Decreased Interest 0 0 1  Down, Depressed, Hopeless 1 1 1   PHQ - 2 Score 1 1 2   Altered sleeping 1 0 1  Tired, decreased energy 1 2 2   Change in appetite 0 0 1  Feeling bad or failure about yourself  0 0 0  Trouble concentrating 0 0 1  Moving slowly or fidgety/restless 0 0 0  Suicidal thoughts 0 0 0  PHQ-9 Score 3 3 7      GAD 7 : Generalized Anxiety Score 02/04/2021 11/04/2020 09/24/2020  Nervous, Anxious, on Edge 1 1 1   Control/stop worrying 1 1 1   Worry too much - different things 1 1 1   Trouble relaxing 1 1 1   Restless 0 0 0  Easily annoyed or irritable 1 1 1   Afraid - awful might happen 0 0 0  Total GAD 7 Score 5 5 5      Review of Systems:   Pertinent items are noted in HPI Denies abnormal vaginal discharge w/ itching/odor/irritation, headaches, visual changes, shortness of breath, chest pain, abdominal pain, severe nausea/vomiting, or problems with urination or bowel movements unless otherwise stated above. Pertinent History Reviewed:  Reviewed past medical,surgical, social, obstetrical and family history.  Reviewed problem list, medications and allergies. Physical Assessment:   Vitals:   04/07/21 1532  BP: 135/87  Pulse: 86  Weight: (!) 339 lb (153.8 kg)  Body mass index is 54.72 kg/m.           Physical Examination:    General appearance: alert, well appearing, and in no distress  Mental status: alert, oriented to person, place, and time  Skin: warm & dry   Extremities: Edema: Mild pitting, slight indentation    Cardiovascular: normal heart rate noted  Respiratory: normal respiratory effort, no distress  Abdomen: gravid, soft, non-tender  Pelvic: Cervical exam performed  Dilation: 1 Effacement (%): Thick Station: Ballotable  Fetal Status: Fetal Heart Rate (bpm): 123 u/s   Movement: Present Presentation: Vertex  Fetal Surveillance Testing today: 04/06/2021 35+5 wks,cephalic,BPP 8/8,fhr 123 bpm,anterior placenta gr 3,AFI 19 cm,RI .46,.62,.60,.66=73%  Chaperone: 01/04/2021    Results for orders placed or performed in visit on 04/07/21 (from the past 24 hour(s))  POC Urinalysis Dipstick OB   Collection Time: 04/07/21  3:41 PM  Result Value Ref Range   Color, UA     Clarity, UA     Glucose, UA Negative Negative   Bilirubin, UA     Ketones, UA neg    Spec Grav, UA     Blood, UA neg    pH, UA     POC,PROTEIN,UA Negative Negative, Trace, Small (1+), Moderate (2+), Large (3+), 4+   Urobilinogen, UA     Nitrite, UA neg    Leukocytes, UA Trace (A) Negative   Appearance     Odor      Assessment & Plan:  High-risk pregnancy: G3P1011 at [redacted]w[redacted]d with an Estimated Date of Delivery: 05/07/21   1) GHTN, stable, dx @ 31wks, reviewed pre-e s/s, reasons to seek care.  IOL scheduled for 10/22 @[place] ,  IOL form faxed via Epic and orders placed   2) Prev c/s, wants TOLAC, hasn't signed consent w/ MD-make next week visit w/ MD only  3) Lt CTS>can try wrist splint  Meds: No orders of the defined types were placed in this encounter.   Labs/procedures today: GBS, GC/CT, SVE, and U/S, tdap  Treatment Plan:  nst Monday, efw/bpp/dopp next Thurs, IOL 10/22 @ 37.0  Reviewed: Preterm labor symptoms and general obstetric precautions including but not limited to vaginal bleeding, contractions, leaking of fluid and fetal  movement were reviewed in detail with the patient.  All questions were answered. Does have home bp cuff. Office bp cuff given: not applicable. Check bp weekly, let 11/22 know if consistently >160 and/or >110.  Follow-up: Return for Mond nst/nurse; then Thurs move to MD only.   Future Appointments  Date Time Provider Department Center  04/14/2021  9:00 AM [facility] None  04/14/2021 10:30 AM 04/16/2021, CNM [facility]  04/21/2021  3:00 PM [facility] 04/23/2021 [facility] None  04/21/2021  3:50 PM 04/23/2021, CNM [facility]  04/28/2021  3:30 PM Cresenzo-Dishmon, 13/08/2020, CNM [facility]    Orders Placed This Encounter  Procedures   Strep Gp B NAA   Tdap vaccine greater than or equal to 7yo IM   POC Urinalysis Dipstick OB   Scarlette Calico CNM, [facility] 04/07/2021 4:21 PM

## 2021-04-07 NOTE — Progress Notes (Signed)
[place] 35+5 wks,cephalic,BPP 8/8,fhr 123 bpm,anterior placenta gr 3,AFI 19 cm,RI .46,.62,.60,.66=73%

## 2021-04-09 LAB — STREP GP B NAA: Strep Gp B NAA: NEGATIVE

## 2021-04-11 ENCOUNTER — Ambulatory Visit (INDEPENDENT_AMBULATORY_CARE_PROVIDER_SITE_OTHER): Payer: Medicaid Other | Admitting: *Deleted

## 2021-04-11 ENCOUNTER — Other Ambulatory Visit: Payer: Self-pay

## 2021-04-11 VITALS — BP 135/83 | HR 97 | Wt 344.8 lb

## 2021-04-11 DIAGNOSIS — Z3A36 36 weeks gestation of pregnancy: Secondary | ICD-10-CM | POA: Diagnosis not present

## 2021-04-11 DIAGNOSIS — O133 Gestational [pregnancy-induced] hypertension without significant proteinuria, third trimester: Secondary | ICD-10-CM | POA: Diagnosis not present

## 2021-04-11 DIAGNOSIS — Z3480 Encounter for supervision of other normal pregnancy, unspecified trimester: Secondary | ICD-10-CM

## 2021-04-11 LAB — CERVICOVAGINAL ANCILLARY ONLY
Chlamydia: NEGATIVE
Comment: NEGATIVE
Comment: NORMAL
Neisseria Gonorrhea: NEGATIVE

## 2021-04-11 NOTE — Progress Notes (Addendum)
   NURSE VISIT- NST  SUBJECTIVE:  Summer Terrell is a 35 y.o. G53P1011 female at [redacted]w[redacted]d, here for a NST for pregnancy complicated by [facility].  She reports active fetal movement, contractions: none, vaginal bleeding: none, membranes: intact.   OBJECTIVE:  BP 135/83   Pulse 97   Wt (!) 344 lb 12.8 oz (156.4 kg)   LMP 07/31/2020   BMI 55.65 kg/m   Appears well, no apparent distress  Pt unable to void.  NST: FHR baseline 145 bpm, Variability: moderate, Accelerations:present, Decelerations:  Absent= Cat 1/reactive Toco: none   ASSESSMENT: G3P1011 at [redacted]w[redacted]d with GHTN NST reactive  PLAN: EFM strip reviewed by [name], CNM, [facility]   Recommendations: keep next appointment as scheduled    [name]  04/11/2021 4:32 PM  Chart reviewed for nurse visit. Agree with plan of care.  Cheral Marker, [place] 04/12/2021 1:14 PM

## 2021-04-13 ENCOUNTER — Other Ambulatory Visit: Payer: Self-pay | Admitting: Women's Health

## 2021-04-13 DIAGNOSIS — O133 Gestational [pregnancy-induced] hypertension without significant proteinuria, third trimester: Secondary | ICD-10-CM

## 2021-04-14 ENCOUNTER — Ambulatory Visit (INDEPENDENT_AMBULATORY_CARE_PROVIDER_SITE_OTHER): Payer: Medicaid Other

## 2021-04-14 ENCOUNTER — Other Ambulatory Visit: Payer: Medicaid Other

## 2021-04-14 ENCOUNTER — Encounter: Payer: Self-pay | Admitting: Advanced Practice Midwife

## 2021-04-14 ENCOUNTER — Encounter: Payer: Medicaid Other | Admitting: Advanced Practice Midwife

## 2021-04-14 ENCOUNTER — Other Ambulatory Visit: Payer: Self-pay

## 2021-04-14 ENCOUNTER — Ambulatory Visit (INDEPENDENT_AMBULATORY_CARE_PROVIDER_SITE_OTHER): Payer: Medicaid Other | Admitting: Advanced Practice Midwife

## 2021-04-14 VITALS — BP 140/85 | HR 86 | Wt 343.0 lb

## 2021-04-14 DIAGNOSIS — O99210 Obesity complicating pregnancy, unspecified trimester: Secondary | ICD-10-CM

## 2021-04-14 DIAGNOSIS — O133 Gestational [pregnancy-induced] hypertension without significant proteinuria, third trimester: Secondary | ICD-10-CM

## 2021-04-14 DIAGNOSIS — Z3A36 36 weeks gestation of pregnancy: Secondary | ICD-10-CM | POA: Diagnosis not present

## 2021-04-14 DIAGNOSIS — O0993 Supervision of high risk pregnancy, unspecified, third trimester: Secondary | ICD-10-CM

## 2021-04-14 DIAGNOSIS — Z3480 Encounter for supervision of other normal pregnancy, unspecified trimester: Secondary | ICD-10-CM

## 2021-04-14 LAB — POCT URINALYSIS DIPSTICK OB
Blood, UA: NEGATIVE
Glucose, UA: NEGATIVE
Ketones, UA: NEGATIVE
Leukocytes, UA: NEGATIVE
Nitrite, UA: NEGATIVE
POC,PROTEIN,UA: NEGATIVE

## 2021-04-14 NOTE — Progress Notes (Signed)
   LOW-RISK PREGNANCY VISIT Patient name: Summer Terrell MRN [id number]  Date of birth: 05-05-1986 Chief Complaint:   Routine Prenatal Visit and Pregnancy Ultrasound (BPP)  History of Present Illness:   Summer Terrell is a 35 y.o. G44P1011 female at [redacted]w[redacted]d with an Estimated Date of Delivery: 05/07/21 being seen today for ongoing management of a low-risk pregnancy.  Today she reports all BS in range. Contractions: Not present. Vag. Bleeding: None.  Movement: Present. denies leaking of fluid. Review of Systems:   Pertinent items are noted in HPI Denies abnormal vaginal discharge w/ itching/odor/irritation, headaches, visual changes, shortness of breath, chest pain, abdominal pain, severe nausea/vomiting, or problems with urination or bowel movements unless otherwise stated above. Pertinent History Reviewed:  Reviewed past medical,surgical, social, obstetrical and family history.  Reviewed problem list, medications and allergies. Physical Assessment:   Vitals:   04/14/21 0944  BP: 140/85  Pulse: 86  Weight: (!) 343 lb (155.6 kg)  Body mass index is 55.36 kg/m.        Physical Examination:   General appearance: Well appearing, and in no distress  Mental status: Alert, oriented to person, place, and time  Skin: Warm & dry  Cardiovascular: Normal heart rate noted  Respiratory: Normal respiratory effort, no distress  Abdomen: Soft, gravid, nontender  Pelvic: Cervical exam deferred         Extremities: Edema: Mild pitting, slight indentation  Fetal Status:     Movement: Present  [place] 36+4 wks,cephalic,BPP 8/8,anterior placenta gr 3,RI .59,.49,.51,.55=28%,FHR 166 bpm,AFI 18 cm,limited view   Chaperone: n/a    Results for orders placed or performed in visit on 04/14/21 (from the past 24 hour(s))  POC Urinalysis Dipstick OB   Collection Time: 04/14/21  9:46 AM  Result Value Ref Range   Color, UA     Clarity, UA     Glucose, UA Negative Negative   Bilirubin, UA     Ketones, UA neg     Spec Grav, UA     Blood, UA neg    pH, UA     POC,PROTEIN,UA Negative Negative, Trace, Small (1+), Moderate (2+), Large (3+), 4+   Urobilinogen, UA     Nitrite, UA neg    Leukocytes, UA Negative Negative   Appearance     Odor      Assessment & Plan:  1) Low-risk pregnancy G3P1011 at [redacted]w[redacted]d with an Estimated Date of Delivery: 05/07/21   2) GHTN, on schedule for IOL at [place] 10/22   Meds: No orders of the defined types were placed in this encounter.  Labs/procedures today: BPP, dopplers  Plan:  IOL scheduled for tomorrow night.    Reviewed:  labor symptoms and general obstetric precautions including but not limited to vaginal bleeding, contractions, leaking of fluid and fetal movement were reviewed in detail with the patient.  All questions were answered. Has home bp cuff.   Follow-up: Return for 10 days for virtual BP check and 5 weeks for postpartum .  Orders Placed This Encounter  Procedures   POC Urinalysis Dipstick OB   Jacklyn Shell DNP, CNM 04/14/2021 7:12 PM

## 2021-04-14 NOTE — Progress Notes (Addendum)
[place] 36+4 wks,cephalic,BPP 8/8,anterior placenta gr 3,RI .59,.49,.51,.55=28%,FHR 166 bpm,AFI 18 cm,EFW 3539 g 93%, limited view

## 2021-04-15 ENCOUNTER — Other Ambulatory Visit: Payer: Self-pay

## 2021-04-16 ENCOUNTER — Encounter (HOSPITAL_COMMUNITY): Payer: Self-pay | Admitting: Obstetrics & Gynecology

## 2021-04-16 ENCOUNTER — Inpatient Hospital Stay (HOSPITAL_COMMUNITY): Payer: Medicaid Other | Admitting: Anesthesiology

## 2021-04-16 ENCOUNTER — Inpatient Hospital Stay (HOSPITAL_COMMUNITY): Payer: Medicaid Other

## 2021-04-16 ENCOUNTER — Inpatient Hospital Stay (HOSPITAL_COMMUNITY)
Admission: AD | Admit: 2021-04-16 | Discharge: 2021-04-19 | DRG: 788 | Disposition: A | Discharge status: Home / Self Care | Payer: Medicaid Other | Attending: Obstetrics & Gynecology | Admitting: Obstetrics & Gynecology

## 2021-04-16 ENCOUNTER — Encounter (HOSPITAL_COMMUNITY): Payer: Medicaid Other

## 2021-04-16 DIAGNOSIS — O165 Unspecified maternal hypertension, complicating the puerperium: Secondary | ICD-10-CM

## 2021-04-16 DIAGNOSIS — Z87891 Personal history of nicotine dependence: Secondary | ICD-10-CM

## 2021-04-16 DIAGNOSIS — O134 Gestational [pregnancy-induced] hypertension without significant proteinuria, complicating childbirth: Principal | ICD-10-CM | POA: Diagnosis present

## 2021-04-16 DIAGNOSIS — O09299 Supervision of pregnancy with other poor reproductive or obstetric history, unspecified trimester: Secondary | ICD-10-CM

## 2021-04-16 DIAGNOSIS — O34211 Maternal care for low transverse scar from previous cesarean delivery: Secondary | ICD-10-CM | POA: Diagnosis present

## 2021-04-16 DIAGNOSIS — O99214 Obesity complicating childbirth: Secondary | ICD-10-CM | POA: Diagnosis present

## 2021-04-16 DIAGNOSIS — Z3480 Encounter for supervision of other normal pregnancy, unspecified trimester: Secondary | ICD-10-CM

## 2021-04-16 DIAGNOSIS — O1495 Unspecified pre-eclampsia, complicating the puerperium: Secondary | ICD-10-CM

## 2021-04-16 DIAGNOSIS — Z8679 Personal history of other diseases of the circulatory system: Secondary | ICD-10-CM

## 2021-04-16 DIAGNOSIS — O1405 Mild to moderate pre-eclampsia, complicating the puerperium: Secondary | ICD-10-CM | POA: Diagnosis not present

## 2021-04-16 DIAGNOSIS — Z98891 History of uterine scar from previous surgery: Secondary | ICD-10-CM

## 2021-04-16 DIAGNOSIS — Z3A37 37 weeks gestation of pregnancy: Secondary | ICD-10-CM | POA: Diagnosis not present

## 2021-04-16 DIAGNOSIS — Z20822 Contact with and (suspected) exposure to covid-19: Secondary | ICD-10-CM | POA: Diagnosis present

## 2021-04-16 DIAGNOSIS — O139 Gestational [pregnancy-induced] hypertension without significant proteinuria, unspecified trimester: Secondary | ICD-10-CM | POA: Diagnosis present

## 2021-04-16 DIAGNOSIS — Z3490 Encounter for supervision of normal pregnancy, unspecified, unspecified trimester: Secondary | ICD-10-CM

## 2021-04-16 DIAGNOSIS — O2412 Pre-existing diabetes mellitus, type 2, in childbirth: Secondary | ICD-10-CM | POA: Diagnosis not present

## 2021-04-16 DIAGNOSIS — O99210 Obesity complicating pregnancy, unspecified trimester: Secondary | ICD-10-CM | POA: Diagnosis present

## 2021-04-16 LAB — COMPREHENSIVE METABOLIC PANEL
ALT: 10 U/L (ref 0–44)
AST: 13 U/L — ABNORMAL LOW (ref 15–41)
Albumin: 2 g/dL — ABNORMAL LOW (ref 3.5–5.0)
Alkaline Phosphatase: 206 U/L — ABNORMAL HIGH (ref 38–126)
Anion gap: 7 (ref 5–15)
BUN: 9 mg/dL (ref 6–20)
CO2: 24 mmol/L (ref 22–32)
Calcium: 9.2 mg/dL (ref 8.9–10.3)
Chloride: 105 mmol/L (ref 98–111)
Creatinine, Ser: 0.58 mg/dL (ref 0.44–1.00)
GFR, Estimated: >60 mL/min (ref >60)
Glucose, Bld: 106 mg/dL — ABNORMAL HIGH (ref 70–99)
Potassium: 4.1 mmol/L (ref 3.5–5.1)
Sodium: 136 mmol/L (ref 135–145)
Total Bilirubin: 0.4 mg/dL (ref 0.3–1.2)
Total Protein: 5.4 g/dL — ABNORMAL LOW (ref 6.5–8.1)

## 2021-04-16 LAB — CBC
HCT: 31.3 % — ABNORMAL LOW (ref 36.0–46.0)
HCT: 32 % — ABNORMAL LOW (ref 36.0–46.0)
Hemoglobin: 10.2 g/dL — ABNORMAL LOW (ref 12.0–15.0)
Hemoglobin: 9.7 g/dL — ABNORMAL LOW (ref 12.0–15.0)
MCH: 24.4 pg — ABNORMAL LOW (ref 26.0–34.0)
MCH: 25 pg — ABNORMAL LOW (ref 26.0–34.0)
MCHC: 31 g/dL (ref 30.0–36.0)
MCHC: 31.9 g/dL (ref 30.0–36.0)
MCV: 78.4 fL — ABNORMAL LOW (ref 80.0–100.0)
MCV: 78.8 fL — ABNORMAL LOW (ref 80.0–100.0)
Platelets: 268 10*3/uL (ref 150–400)
Platelets: 290 10*3/uL (ref 150–400)
RBC: 3.97 MIL/uL (ref 3.87–5.11)
RBC: 4.08 MIL/uL (ref 3.87–5.11)
RDW: 16.1 % — ABNORMAL HIGH (ref 11.5–15.5)
RDW: 16.5 % — ABNORMAL HIGH (ref 11.5–15.5)
WBC: 11.2 10*3/uL — ABNORMAL HIGH (ref 4.0–10.5)
WBC: 14.4 10*3/uL — ABNORMAL HIGH (ref 4.0–10.5)
nRBC: 0 % (ref 0.0–0.2)
nRBC: 0 % (ref 0.0–0.2)

## 2021-04-16 LAB — RESP PANEL BY RT-PCR (FLU A&B, COVID) ARPGX2
Influenza A by PCR: NEGATIVE
Influenza B by PCR: NEGATIVE
SARS Coronavirus 2 by RT PCR: NEGATIVE

## 2021-04-16 LAB — PROTEIN / CREATININE RATIO, URINE
Creatinine, Urine: 70.13 mg/dL
Protein Creatinine Ratio: 0.29 mg/mg{Cre} — ABNORMAL HIGH (ref 0.00–0.15)
Total Protein, Urine: 20 mg/dL

## 2021-04-16 LAB — TYPE AND SCREEN
ABO/RH(D): B POS
Antibody Screen: NEGATIVE

## 2021-04-16 LAB — RPR: RPR Ser Ql: NONREACTIVE

## 2021-04-16 MED ORDER — OXYTOCIN-SODIUM CHLORIDE 30-0.9 UT/500ML-% IV SOLN
1.0000 m[IU]/min | INTRAVENOUS | Status: DC
  Administered 2021-04-16: 2 m[IU]/min via INTRAVENOUS
  Filled 2021-04-16: qty 500

## 2021-04-16 MED ORDER — LACTATED RINGERS IV SOLN: 500.0000 mL | INTRAVENOUS | Status: DC | PRN

## 2021-04-16 MED ORDER — LIDOCAINE HCL (PF) 1 % IJ SOLN
INTRAMUSCULAR | Status: DC | PRN
  Administered 2021-04-16: 11 mL via EPIDURAL

## 2021-04-16 MED ORDER — FLEET ENEMA 7-19 GM/118ML RE ENEM: 1.0000 | ENEMA | RECTAL | Status: DC | PRN

## 2021-04-16 MED ORDER — LACTATED RINGERS IV SOLN
500.0000 mL | Freq: Once | INTRAVENOUS | Status: AC
  Administered 2021-04-16: 500 mL via INTRAVENOUS

## 2021-04-16 MED ORDER — OXYCODONE-ACETAMINOPHEN 5-325 MG PO TABS: 2.0000 | ORAL_TABLET | ORAL | Status: DC | PRN

## 2021-04-16 MED ORDER — TERBUTALINE SULFATE 1 MG/ML IJ SOLN: 0.2500 mg | Freq: Once | INTRAMUSCULAR | Status: DC | PRN

## 2021-04-16 MED ORDER — OXYTOCIN-SODIUM CHLORIDE 30-0.9 UT/500ML-% IV SOLN: 2.5000 [IU]/h | INTRAVENOUS | Status: DC

## 2021-04-16 MED ORDER — FENTANYL CITRATE (PF) 100 MCG/2ML IJ SOLN
100.0000 ug | INTRAMUSCULAR | Status: DC | PRN
  Administered 2021-04-16 (×6): 100 ug via INTRAVENOUS
  Filled 2021-04-16 (×7): qty 2

## 2021-04-16 MED ORDER — OXYTOCIN BOLUS FROM INFUSION: 333.0000 mL | Freq: Once | INTRAVENOUS | Status: DC

## 2021-04-16 MED ORDER — EPHEDRINE 5 MG/ML INJ: 10.0000 mg | INTRAVENOUS | Status: DC | PRN

## 2021-04-16 MED ORDER — ACETAMINOPHEN 325 MG PO TABS: 650.0000 mg | ORAL_TABLET | ORAL | Status: DC | PRN

## 2021-04-16 MED ORDER — LIDOCAINE HCL (PF) 1 % IJ SOLN: 30.0000 mL | INTRAMUSCULAR | Status: DC | PRN

## 2021-04-16 MED ORDER — OXYCODONE-ACETAMINOPHEN 5-325 MG PO TABS: 1.0000 | ORAL_TABLET | ORAL | Status: DC | PRN

## 2021-04-16 MED ORDER — ONDANSETRON HCL 4 MG/2ML IJ SOLN: 4.0000 mg | Freq: Four times a day (QID) | INTRAMUSCULAR | Status: DC | PRN

## 2021-04-16 MED ORDER — HYDROXYZINE HCL 50 MG PO TABS: 50.0000 mg | ORAL_TABLET | Freq: Four times a day (QID) | ORAL | Status: DC | PRN

## 2021-04-16 MED ORDER — PHENYLEPHRINE 40 MCG/ML (10ML) SYRINGE FOR IV PUSH (FOR BLOOD PRESSURE SUPPORT): 80.0000 ug | PREFILLED_SYRINGE | INTRAVENOUS | Status: DC | PRN

## 2021-04-16 MED ORDER — SOD CITRATE-CITRIC ACID 500-334 MG/5ML PO SOLN
30.0000 mL | ORAL | Status: DC | PRN
  Administered 2021-04-16 – 2021-04-17 (×2): 30 mL via ORAL
  Filled 2021-04-16 (×2): qty 30

## 2021-04-16 MED ORDER — PHENYLEPHRINE 40 MCG/ML (10ML) SYRINGE FOR IV PUSH (FOR BLOOD PRESSURE SUPPORT)
80.0000 ug | PREFILLED_SYRINGE | INTRAVENOUS | Status: DC | PRN
  Filled 2021-04-16: qty 10

## 2021-04-16 MED ORDER — FENTANYL-BUPIVACAINE-NACL 0.5-0.125-0.9 MG/250ML-% EP SOLN
12.0000 mL/h | EPIDURAL | Status: DC | PRN
  Administered 2021-04-16 – 2021-04-17 (×2): 12 mL/h via EPIDURAL
  Filled 2021-04-16 (×2): qty 250

## 2021-04-16 MED ORDER — LACTATED RINGERS IV SOLN
INTRAVENOUS | Status: DC
  Administered 2021-04-17: 125 mL/h via INTRAVENOUS

## 2021-04-16 MED ORDER — DIPHENHYDRAMINE HCL 50 MG/ML IJ SOLN
12.5000 mg | INTRAMUSCULAR | Status: DC | PRN
  Administered 2021-04-16: 12.5 mg via INTRAVENOUS
  Filled 2021-04-16: qty 1
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
## 2021-04-16 NOTE — Plan of Care (Signed)
  Problem: Education: Goal: Knowledge of Childbirth will improve Outcome: Adequate for Discharge Goal: Ability to make informed decisions regarding treatment and plan of care will improve Outcome: Adequate for Discharge Goal: Ability to state and carry out methods to decrease the pain will improve Outcome: Adequate for Discharge

## 2021-04-16 NOTE — Progress Notes (Signed)
Summer Terrell is a 35 y.o. G3P1011 at [redacted]w[redacted]d  admitted for TOLAC/IOL for gHTN  Subjective: Reports some cramping intermittently. Confirms last CS was for nrFHT as she started pushing  Objective: BP 132/80   Pulse 96   Temp 97.8 F (36.6 C) (Oral)   Resp 18   Ht 5\' 9"  (1.753 m)   Wt (!) 157.4 kg   LMP 07/31/2020   BMI 51.24 kg/m  I/O last 3 completed shifts: In: 408.9 [I.V.:408.9] Out: -  No intake/output data recorded.  FHT:  FHR: 140 bpm, variability: moderate,  accelerations:  Present,  decelerations:  Absent UC:   irregular SVE:   Dilation: Fingertip Effacement (%): Thick Station: Ballotable Exam by:: [email or web address], CNM  Labs: Lab Results  Component Value Date   WBC 11.2 (H) 04/16/2021   HGB 10.2 (L) 04/16/2021   HCT 32.0 (L) 04/16/2021   MCV 78.4 (L) 04/16/2021   PLT 290 04/16/2021    Assessment / Plan: Induction of labor due to gHTN  Labor:  Foley bulb still in place, on low dose pitocin. Continue until FB out, then check and increase pitocin gHTN:  labs stable and BP in 130s/80s Fetal Wellbeing:  Category I Pain Control:   Plans epidural I/D:  n/a  04/18/2021 04/16/2021, 2:26 PM

## 2021-04-16 NOTE — Progress Notes (Signed)
I last cared for patient at 1431 and am now resuming care of pt

## 2021-04-16 NOTE — Progress Notes (Signed)
External montiors removed and F&M monitors applied

## 2021-04-16 NOTE — Anesthesia Preprocedure Evaluation (Addendum)
Anesthesia Evaluation  Patient identified by MRN, date of birth, ID band Patient awake    Reviewed: Allergy & Precautions, NPO status , Patient's Chart, lab work & pertinent test results  Airway Mallampati: II  TM Distance: >3 FB Neck ROM: Full    Dental no notable dental hx.    Pulmonary asthma , former smoker,    Pulmonary exam normal breath sounds clear to auscultation       Cardiovascular hypertension, Normal cardiovascular exam Rhythm:Regular Rate:Normal     Neuro/Psych Anxiety Bipolar Disorder negative neurological ROS  negative psych ROS   GI/Hepatic negative GI ROS, Neg liver ROS,   Endo/Other  Morbid obesity  Renal/GU negative Renal ROS  negative genitourinary   Musculoskeletal negative musculoskeletal ROS (+)   Abdominal (+) + obese,   Peds negative pediatric ROS (+)  Hematology negative hematology ROS (+)   Anesthesia Other Findings   Reproductive/Obstetrics (+) Pregnancy                             Anesthesia Physical Anesthesia Plan  ASA: 3  Anesthesia Plan: Epidural   Post-op Pain Management:    Induction: Intravenous  PONV Risk Score and Plan: Treatment may vary due to age or medical condition  Airway Management Planned:   Additional Equipment:   Intra-op Plan:   Post-operative Plan:   Informed Consent: I have reviewed the patients History and Physical, chart, labs and discussed the procedure including the risks, benefits and alternatives for the proposed anesthesia with the patient or authorized representative who has indicated his/her understanding and acceptance.     Dental advisory given  Plan Discussed with: CRNA  Anesthesia Plan Comments: ( UPDATE 04/17/21 1:09 PM: Patient to go to OR for C section due to failure to progress. Labor epidural in place and functioning well. Will plan to use epidural for surgical anesthesia. Discussed with patient. )        Anesthesia Quick Evaluation

## 2021-04-16 NOTE — Progress Notes (Signed)
Pt up in room then up to bathroom

## 2021-04-16 NOTE — Progress Notes (Signed)
Summer Terrell is a 35 y.o. G3P1011 at [redacted]w[redacted]d admitted for induction of labor due to [place] Day.  Subjective: Pt denies any cramping, contractions, is feeling normal fetal movement. Family in room for support.   Objective: BP 134/70   Pulse 99   Temp 98.4 F (36.9 C) (Oral)   Resp 20   Ht 5\' 9"  (1.753 m)   Wt (!) 157.4 kg   LMP 07/31/2020   BMI 51.24 kg/m  No intake/output data recorded. No intake/output data recorded.  FHT:  FHR: 140 bpm, variability: moderate,  accelerations:  Present,  decelerations:  Absent UC:   none SVE:   Dilation: Fingertip Effacement (%): Thick Station: Ballotable Exam by:: [email or web address], CNM Foley balloon inserted with minimal difficulty. Pt tolerated well.  Labs: Lab Results  Component Value Date   WBC 11.2 (H) 04/16/2021   HGB 10.2 (L) 04/16/2021   HCT 32.0 (L) 04/16/2021   MCV 78.4 (L) 04/16/2021   PLT 290 04/16/2021    Assessment / Plan: Induction of labor due to [facility] Foley balloon in place  Labor:  Start low dose Pitocin at 2 mu/min, increase by 2 per protocol until 6. Remain at 6 while foley bulb in place. Preeclampsia:  labs stable Fetal Wellbeing:  Category I Pain Control:  Labor support without medications I/D:   GBS neg Anticipated MOD:   VBAC  Tyrelle Raczka [place]Kirby 04/16/2021, 2:42 AM

## 2021-04-16 NOTE — Progress Notes (Signed)
Summer Terrell is a 35 y.o. G3P1011 at [redacted]w[redacted]d  admitted for TOLAC/IOL for gHTN  Subjective: Feels some pressure with contractions  Foley bulb out, exam 3/60/-2    Objective: BP 137/83   Pulse 97   Temp 98.4 F (36.9 C) (Oral)   Resp 20   Ht 5\' 9"  (1.753 m)   Wt (!) 157.4 kg   LMP 07/31/2020   BMI 51.24 kg/m  I/O last 3 completed shifts: In: 408.9 [I.V.:408.9] Out: -  No intake/output data recorded.  FHT:  FHR: 150 bpm, variability: moderate,  accelerations:  Present,  decelerations:  Absent UC:   regular, every 3-5 minutes SVE:   Dilation: 3 Effacement (%): 60, 70 Station: Ballotable Exam by:: [email or web address] RN  Labs: Lab Results  Component Value Date   WBC 11.2 (H) 04/16/2021   HGB 10.2 (L) 04/16/2021   HCT 32.0 (L) 04/16/2021   MCV 78.4 (L) 04/16/2021   PLT 290 04/16/2021    Assessment / Plan: Foley bulb out, 3/60/-2 Previously on low dose pitocin, now will increase pitocin 2x2  04/18/2021 04/16/2021, 4:33 PM

## 2021-04-16 NOTE — Anesthesia Procedure Notes (Signed)
Epidural Patient location during procedure: OB Start time: 04/16/2021 6:51 PM End time: 04/16/2021 7:07 PM  Staffing Anesthesiologist: Lowella Curb, MD Performed: anesthesiologist   Preanesthetic Checklist Completed: patient identified, IV checked, site marked, risks and benefits discussed, surgical consent, monitors and equipment checked, pre-op evaluation and timeout performed  Epidural Patient position: sitting Prep: ChloraPrep Patient monitoring: heart rate, cardiac monitor, continuous pulse ox and blood pressure Approach: midline Location: L2-L3 Injection technique: LOR saline  Needle:  Needle type: Tuohy  Needle gauge: 17 G Needle length: 9 cm Needle insertion depth: 9 cm Catheter type: closed end flexible Catheter size: 20 Guage Catheter at skin depth: 14 cm Test dose: negative  Assessment Events: blood not aspirated, injection not painful, no injection resistance, no paresthesia and negative IV test  Additional Notes Reason for block:procedure for pain

## 2021-04-17 ENCOUNTER — Encounter (HOSPITAL_COMMUNITY)
Admission: AD | Disposition: A | Discharge status: Home / Self Care | Payer: Self-pay | Source: Home / Self Care | Attending: Obstetrics & Gynecology

## 2021-04-17 ENCOUNTER — Encounter (HOSPITAL_COMMUNITY): Payer: Self-pay | Admitting: Obstetrics & Gynecology

## 2021-04-17 DIAGNOSIS — O34211 Maternal care for low transverse scar from previous cesarean delivery: Secondary | ICD-10-CM

## 2021-04-17 DIAGNOSIS — O134 Gestational [pregnancy-induced] hypertension without significant proteinuria, complicating childbirth: Secondary | ICD-10-CM

## 2021-04-17 DIAGNOSIS — Z3A37 37 weeks gestation of pregnancy: Secondary | ICD-10-CM

## 2021-04-17 DIAGNOSIS — O2412 Pre-existing diabetes mellitus, type 2, in childbirth: Secondary | ICD-10-CM

## 2021-04-17 LAB — COMPREHENSIVE METABOLIC PANEL
ALT: 9 U/L (ref 0–44)
AST: 16 U/L (ref 15–41)
Albumin: 2.1 g/dL — ABNORMAL LOW (ref 3.5–5.0)
Alkaline Phosphatase: 217 U/L — ABNORMAL HIGH (ref 38–126)
Anion gap: 9 (ref 5–15)
BUN: 6 mg/dL (ref 6–20)
CO2: 22 mmol/L (ref 22–32)
Calcium: 8.9 mg/dL (ref 8.9–10.3)
Chloride: 104 mmol/L (ref 98–111)
Creatinine, Ser: 0.59 mg/dL (ref 0.44–1.00)
GFR, Estimated: >60 mL/min (ref >60)
Glucose, Bld: 109 mg/dL — ABNORMAL HIGH (ref 70–99)
Potassium: 3.7 mmol/L (ref 3.5–5.1)
Sodium: 135 mmol/L (ref 135–145)
Total Bilirubin: 0.4 mg/dL (ref 0.3–1.2)
Total Protein: 6 g/dL — ABNORMAL LOW (ref 6.5–8.1)

## 2021-04-17 LAB — CBC
HCT: 32.7 % — ABNORMAL LOW (ref 36.0–46.0)
Hemoglobin: 10.1 g/dL — ABNORMAL LOW (ref 12.0–15.0)
MCH: 24.6 pg — ABNORMAL LOW (ref 26.0–34.0)
MCHC: 30.9 g/dL (ref 30.0–36.0)
MCV: 79.8 fL — ABNORMAL LOW (ref 80.0–100.0)
Platelets: 282 10*3/uL (ref 150–400)
RBC: 4.1 MIL/uL (ref 3.87–5.11)
RDW: 16.5 % — ABNORMAL HIGH (ref 11.5–15.5)
WBC: 14.2 10*3/uL — ABNORMAL HIGH (ref 4.0–10.5)
nRBC: 0 % (ref 0.0–0.2)

## 2021-04-17 LAB — PROTEIN / CREATININE RATIO, URINE
Creatinine, Urine: 76.06 mg/dL
Protein Creatinine Ratio: 0.3 mg/mg{Cre} — ABNORMAL HIGH (ref 0.00–0.15)
Total Protein, Urine: 23 mg/dL

## 2021-04-17 SURGERY — Surgical Case
Anesthesia: Epidural

## 2021-04-17 MED ORDER — SCOPOLAMINE 1 MG/3DAYS TD PT72: 1.0000 | MEDICATED_PATCH | Freq: Once | TRANSDERMAL | Status: DC

## 2021-04-17 MED ORDER — MEASLES, MUMPS & RUBELLA VAC IJ SOLR: 0.5000 mL | Freq: Once | INTRAMUSCULAR | Status: DC

## 2021-04-17 MED ORDER — ONDANSETRON HCL 4 MG/2ML IJ SOLN: 4.0000 mg | Freq: Three times a day (TID) | INTRAMUSCULAR | Status: DC | PRN

## 2021-04-17 MED ORDER — COCONUT OIL OIL: 1.0000 "application " | TOPICAL_OIL | Status: DC | PRN

## 2021-04-17 MED ORDER — ACETAMINOPHEN 10 MG/ML IV SOLN
INTRAVENOUS | Status: AC
  Filled 2021-04-17: qty 100

## 2021-04-17 MED ORDER — DEXAMETHASONE SODIUM PHOSPHATE 4 MG/ML IJ SOLN
INTRAMUSCULAR | Status: AC
  Filled 2021-04-17: qty 1

## 2021-04-17 MED ORDER — ACETAMINOPHEN 10 MG/ML IV SOLN: 1000.0000 mg | Freq: Once | INTRAVENOUS | Status: DC | PRN

## 2021-04-17 MED ORDER — ALBUMIN HUMAN 5 % IV SOLN
INTRAVENOUS | Status: AC
  Filled 2021-04-17: qty 250

## 2021-04-17 MED ORDER — NALBUPHINE HCL 10 MG/ML IJ SOLN: 5.0000 mg | Freq: Once | INTRAMUSCULAR | Status: DC | PRN

## 2021-04-17 MED ORDER — METHYLERGONOVINE MALEATE 0.2 MG/ML IJ SOLN
INTRAMUSCULAR | Status: AC
  Filled 2021-04-17: qty 1

## 2021-04-17 MED ORDER — PHENYLEPHRINE 40 MCG/ML (10ML) SYRINGE FOR IV PUSH (FOR BLOOD PRESSURE SUPPORT)
PREFILLED_SYRINGE | INTRAVENOUS | Status: DC | PRN
  Administered 2021-04-17 (×2): 80 ug via INTRAVENOUS
  Administered 2021-04-17 (×2): 40 ug via INTRAVENOUS

## 2021-04-17 MED ORDER — ENOXAPARIN SODIUM 40 MG/0.4ML IJ SOSY
40.0000 mg | PREFILLED_SYRINGE | Freq: Two times a day (BID) | INTRAMUSCULAR | Status: DC
  Administered 2021-04-18 – 2021-04-19 (×3): 40 mg via SUBCUTANEOUS
  Filled 2021-04-17 (×3): qty 0.4

## 2021-04-17 MED ORDER — SIMETHICONE 80 MG PO CHEW: 80.0000 mg | CHEWABLE_TABLET | ORAL | Status: DC | PRN

## 2021-04-17 MED ORDER — FENTANYL CITRATE (PF) 100 MCG/2ML IJ SOLN
INTRAMUSCULAR | Status: AC
  Filled 2021-04-17: qty 2

## 2021-04-17 MED ORDER — SODIUM CHLORIDE 0.9 % IR SOLN
Status: DC | PRN
  Administered 2021-04-17: 1

## 2021-04-17 MED ORDER — DIBUCAINE (PERIANAL) 1 % EX OINT: 1.0000 "application " | TOPICAL_OINTMENT | CUTANEOUS | Status: DC | PRN

## 2021-04-17 MED ORDER — TETANUS-DIPHTH-ACELL PERTUSSIS 5-2.5-18.5 LF-MCG/0.5 IM SUSY: 0.5000 mL | PREFILLED_SYRINGE | Freq: Once | INTRAMUSCULAR | Status: DC

## 2021-04-17 MED ORDER — CHLOROPROCAINE HCL (PF) 3 % IJ SOLN
INTRAMUSCULAR | Status: DC | PRN
  Administered 2021-04-17: 20 mL

## 2021-04-17 MED ORDER — MAGNESIUM HYDROXIDE 400 MG/5ML PO SUSP: 30.0000 mL | ORAL | Status: DC | PRN

## 2021-04-17 MED ORDER — TRANEXAMIC ACID-NACL 1000-0.7 MG/100ML-% IV SOLN
INTRAVENOUS | Status: AC
  Filled 2021-04-17: qty 100

## 2021-04-17 MED ORDER — LIDOCAINE-EPINEPHRINE (PF) 2 %-1:200000 IJ SOLN
INTRAMUSCULAR | Status: DC | PRN
  Administered 2021-04-17: 5 mL via EPIDURAL
  Administered 2021-04-17: 3 mL via EPIDURAL
  Administered 2021-04-17: 5 mL via EPIDURAL

## 2021-04-17 MED ORDER — FENTANYL CITRATE (PF) 100 MCG/2ML IJ SOLN
INTRAMUSCULAR | Status: DC | PRN
  Administered 2021-04-17 (×2): 100 ug via INTRAVENOUS

## 2021-04-17 MED ORDER — GABAPENTIN 300 MG PO CAPS: 300.0000 mg | ORAL_CAPSULE | Freq: Two times a day (BID) | ORAL | Status: DC

## 2021-04-17 MED ORDER — DIPHENHYDRAMINE HCL 25 MG PO CAPS: 25.0000 mg | ORAL_CAPSULE | Freq: Four times a day (QID) | ORAL | Status: DC | PRN

## 2021-04-17 MED ORDER — SODIUM CHLORIDE 0.9 % IV SOLN
500.0000 mg | Freq: Once | INTRAVENOUS | Status: AC
  Administered 2021-04-17: 500 mg via INTRAVENOUS

## 2021-04-17 MED ORDER — HYDRALAZINE HCL 20 MG/ML IJ SOLN: 10.0000 mg | INTRAMUSCULAR | Status: DC | PRN

## 2021-04-17 MED ORDER — GENTAMICIN SULFATE 40 MG/ML IJ SOLN
5.0000 mg/kg | Freq: Once | INTRAVENOUS | Status: AC
  Administered 2021-04-17: 510 mg via INTRAVENOUS
  Filled 2021-04-17: qty 12.75

## 2021-04-17 MED ORDER — MORPHINE SULFATE (PF) 0.5 MG/ML IJ SOLN
INTRAMUSCULAR | Status: AC
  Filled 2021-04-17: qty 10

## 2021-04-17 MED ORDER — SODIUM CHLORIDE 0.9 % IV SOLN
500.0000 mg | Freq: Once | INTRAVENOUS | Status: AC
  Administered 2021-04-18: 500 mg via INTRAVENOUS
  Filled 2021-04-17: qty 25

## 2021-04-17 MED ORDER — DEXAMETHASONE SODIUM PHOSPHATE 10 MG/ML IJ SOLN
INTRAMUSCULAR | Status: AC
  Filled 2021-04-17: qty 2

## 2021-04-17 MED ORDER — LACTATED RINGERS IV SOLN: INTRAVENOUS | Status: DC

## 2021-04-17 MED ORDER — CLINDAMYCIN PHOSPHATE 900 MG/50ML IV SOLN
900.0000 mg | Freq: Once | INTRAVENOUS | Status: AC
  Administered 2021-04-17: 900 mg via INTRAVENOUS

## 2021-04-17 MED ORDER — PROMETHAZINE HCL 25 MG/ML IJ SOLN: 6.2500 mg | INTRAMUSCULAR | Status: DC | PRN

## 2021-04-17 MED ORDER — ONDANSETRON HCL 4 MG/2ML IJ SOLN
INTRAMUSCULAR | Status: AC
  Filled 2021-04-17: qty 4

## 2021-04-17 MED ORDER — CLINDAMYCIN PHOSPHATE 900 MG/50ML IV SOLN
INTRAVENOUS | Status: AC
  Filled 2021-04-17: qty 50

## 2021-04-17 MED ORDER — ACETAMINOPHEN 500 MG PO TABS
1000.0000 mg | ORAL_TABLET | Freq: Four times a day (QID) | ORAL | Status: DC
  Administered 2021-04-18 – 2021-04-19 (×7): 1000 mg via ORAL
  Filled 2021-04-17 (×7): qty 2

## 2021-04-17 MED ORDER — ONDANSETRON HCL 4 MG/2ML IJ SOLN
INTRAMUSCULAR | Status: DC | PRN
  Administered 2021-04-17: 4 mg via INTRAVENOUS

## 2021-04-17 MED ORDER — MIDAZOLAM HCL 2 MG/2ML IJ SOLN
INTRAMUSCULAR | Status: DC | PRN
  Administered 2021-04-17: 2 mg via INTRAVENOUS

## 2021-04-17 MED ORDER — PRENATAL MULTIVITAMIN CH
1.0000 | ORAL_TABLET | Freq: Every day | ORAL | Status: DC
  Administered 2021-04-18 – 2021-04-19 (×2): 1 via ORAL
  Filled 2021-04-17 (×2): qty 1

## 2021-04-17 MED ORDER — NIFEDIPINE ER OSMOTIC RELEASE 30 MG PO TB24
30.0000 mg | ORAL_TABLET | Freq: Every day | ORAL | Status: DC
  Administered 2021-04-17 – 2021-04-19 (×2): 30 mg via ORAL
  Filled 2021-04-17 (×3): qty 1

## 2021-04-17 MED ORDER — SENNOSIDES-DOCUSATE SODIUM 8.6-50 MG PO TABS
2.0000 | ORAL_TABLET | Freq: Every day | ORAL | Status: DC
  Administered 2021-04-18: 2 via ORAL
  Filled 2021-04-17 (×2): qty 2

## 2021-04-17 MED ORDER — LABETALOL HCL 5 MG/ML IV SOLN
20.0000 mg | INTRAVENOUS | Status: DC | PRN
  Filled 2021-04-17: qty 4

## 2021-04-17 MED ORDER — OXYCODONE HCL 5 MG/5ML PO SOLN: 5.0000 mg | Freq: Once | ORAL | Status: DC | PRN

## 2021-04-17 MED ORDER — NALOXONE HCL 0.4 MG/ML IJ SOLN: 0.4000 mg | INTRAMUSCULAR | Status: DC | PRN

## 2021-04-17 MED ORDER — DEXMEDETOMIDINE (PRECEDEX) IN NS 20 MCG/5ML (4 MCG/ML) IV SYRINGE
PREFILLED_SYRINGE | INTRAVENOUS | Status: AC
  Filled 2021-04-17: qty 5

## 2021-04-17 MED ORDER — OXYTOCIN-SODIUM CHLORIDE 30-0.9 UT/500ML-% IV SOLN
INTRAVENOUS | Status: DC | PRN
  Administered 2021-04-17: 30 [IU] via INTRAVENOUS

## 2021-04-17 MED ORDER — MENTHOL 3 MG MT LOZG: 1.0000 | LOZENGE | OROMUCOSAL | Status: DC | PRN

## 2021-04-17 MED ORDER — CEFAZOLIN SODIUM-DEXTROSE 2-4 GM/100ML-% IV SOLN: 2.0000 g | Freq: Once | INTRAVENOUS | Status: DC

## 2021-04-17 MED ORDER — WITCH HAZEL-GLYCERIN EX PADS: 1.0000 "application " | MEDICATED_PAD | CUTANEOUS | Status: DC | PRN

## 2021-04-17 MED ORDER — PHENYLEPHRINE HCL-NACL 20-0.9 MG/250ML-% IV SOLN
INTRAVENOUS | Status: AC
  Filled 2021-04-17: qty 250

## 2021-04-17 MED ORDER — NALOXONE HCL 4 MG/10ML IJ SOLN
1.0000 ug/kg/h | INTRAVENOUS | Status: DC | PRN
  Filled 2021-04-17: qty 5

## 2021-04-17 MED ORDER — HYDROMORPHONE HCL 1 MG/ML IJ SOLN: 1.0000 mg | INTRAMUSCULAR | Status: DC | PRN

## 2021-04-17 MED ORDER — DIPHENHYDRAMINE HCL 50 MG/ML IJ SOLN: 12.5000 mg | INTRAMUSCULAR | Status: DC | PRN

## 2021-04-17 MED ORDER — LABETALOL HCL 5 MG/ML IV SOLN: 40.0000 mg | INTRAVENOUS | Status: DC | PRN

## 2021-04-17 MED ORDER — HYDROMORPHONE HCL 1 MG/ML IJ SOLN: 0.2500 mg | INTRAMUSCULAR | Status: DC | PRN

## 2021-04-17 MED ORDER — DIPHENHYDRAMINE HCL 25 MG PO CAPS: 25.0000 mg | ORAL_CAPSULE | ORAL | Status: DC | PRN

## 2021-04-17 MED ORDER — CHLOROPROCAINE HCL (PF) 3 % IJ SOLN
INTRAMUSCULAR | Status: AC
  Filled 2021-04-17: qty 20

## 2021-04-17 MED ORDER — SODIUM CHLORIDE 0.9 % IV SOLN
INTRAVENOUS | Status: AC
  Filled 2021-04-17: qty 500

## 2021-04-17 MED ORDER — LIDOCAINE HCL (PF) 2 % IJ SOLN
INTRAMUSCULAR | Status: AC
  Filled 2021-04-17: qty 20

## 2021-04-17 MED ORDER — NALBUPHINE HCL 10 MG/ML IJ SOLN: 5.0000 mg | INTRAMUSCULAR | Status: DC | PRN

## 2021-04-17 MED ORDER — LIDOCAINE HCL (PF) 2 % IJ SOLN
INTRAMUSCULAR | Status: AC
  Filled 2021-04-17: qty 5

## 2021-04-17 MED ORDER — EPINEPHRINE PF 1 MG/ML IJ SOLN
INTRAMUSCULAR | Status: AC
  Filled 2021-04-17: qty 1

## 2021-04-17 MED ORDER — KETOROLAC TROMETHAMINE 30 MG/ML IJ SOLN
INTRAMUSCULAR | Status: AC
  Filled 2021-04-17: qty 1

## 2021-04-17 MED ORDER — LIDOCAINE 2% (20 MG/ML) 5 ML SYRINGE
INTRAMUSCULAR | Status: AC
  Filled 2021-04-17: qty 5

## 2021-04-17 MED ORDER — MEPERIDINE HCL 25 MG/ML IJ SOLN: 6.2500 mg | INTRAMUSCULAR | Status: DC | PRN

## 2021-04-17 MED ORDER — KETOROLAC TROMETHAMINE 30 MG/ML IJ SOLN
30.0000 mg | Freq: Four times a day (QID) | INTRAMUSCULAR | Status: AC | PRN
  Administered 2021-04-17: 30 mg via INTRAMUSCULAR

## 2021-04-17 MED ORDER — MIDAZOLAM HCL 2 MG/2ML IJ SOLN
INTRAMUSCULAR | Status: AC
  Filled 2021-04-17: qty 2

## 2021-04-17 MED ORDER — KETOROLAC TROMETHAMINE 30 MG/ML IJ SOLN: 30.0000 mg | Freq: Four times a day (QID) | INTRAMUSCULAR | Status: AC | PRN

## 2021-04-17 MED ORDER — OXYCODONE-ACETAMINOPHEN 5-325 MG PO TABS
2.0000 | ORAL_TABLET | ORAL | Status: DC | PRN
Start: 2021-04-17 — End: 2021-04-19

## 2021-04-17 MED ORDER — LABETALOL HCL 5 MG/ML IV SOLN: 80.0000 mg | INTRAVENOUS | Status: DC | PRN

## 2021-04-17 MED ORDER — GENTAMICIN SULFATE 40 MG/ML IJ SOLN: 5.0000 mg/kg | INTRAVENOUS | Status: DC

## 2021-04-17 MED ORDER — OXYTOCIN-SODIUM CHLORIDE 30-0.9 UT/500ML-% IV SOLN: 2.5000 [IU]/h | INTRAVENOUS | Status: AC

## 2021-04-17 MED ORDER — METHYLERGONOVINE MALEATE 0.2 MG/ML IJ SOLN
INTRAMUSCULAR | Status: DC | PRN
  Administered 2021-04-17: .2 mg via INTRAMUSCULAR

## 2021-04-17 MED ORDER — OXYCODONE HCL 5 MG PO TABS: 5.0000 mg | ORAL_TABLET | Freq: Once | ORAL | Status: DC | PRN

## 2021-04-17 MED ORDER — KETOROLAC TROMETHAMINE 30 MG/ML IJ SOLN
30.0000 mg | Freq: Four times a day (QID) | INTRAMUSCULAR | Status: AC
  Administered 2021-04-18 (×3): 30 mg via INTRAVENOUS
  Filled 2021-04-17 (×3): qty 1

## 2021-04-17 MED ORDER — PHENYLEPHRINE 40 MCG/ML (10ML) SYRINGE FOR IV PUSH (FOR BLOOD PRESSURE SUPPORT)
PREFILLED_SYRINGE | INTRAVENOUS | Status: AC
  Filled 2021-04-17: qty 10

## 2021-04-17 MED ORDER — OXYCODONE HCL 5 MG PO TABS
5.0000 mg | ORAL_TABLET | ORAL | Status: DC | PRN
  Administered 2021-04-18 – 2021-04-19 (×2): 5 mg via ORAL
  Administered 2021-04-19: 10 mg via ORAL
  Administered 2021-04-19: 5 mg via ORAL
  Filled 2021-04-17: qty 1
  Filled 2021-04-17: qty 2
  Filled 2021-04-17 (×2): qty 1

## 2021-04-17 MED ORDER — FERROUS SULFATE 325 (65 FE) MG PO TABS
325.0000 mg | ORAL_TABLET | ORAL | Status: DC
  Filled 2021-04-17: qty 1

## 2021-04-17 MED ORDER — MORPHINE SULFATE (PF) 0.5 MG/ML IJ SOLN
INTRAMUSCULAR | Status: DC | PRN
  Administered 2021-04-17: 2 mg via EPIDURAL
  Administered 2021-04-17: 3 mg via EPIDURAL

## 2021-04-17 MED ORDER — ZOLPIDEM TARTRATE 5 MG PO TABS: 5.0000 mg | ORAL_TABLET | Freq: Every evening | ORAL | Status: DC | PRN

## 2021-04-17 MED ORDER — IBUPROFEN 600 MG PO TABS
600.0000 mg | ORAL_TABLET | Freq: Four times a day (QID) | ORAL | Status: DC
  Administered 2021-04-18 – 2021-04-19 (×4): 600 mg via ORAL
  Filled 2021-04-17 (×4): qty 1

## 2021-04-17 MED ORDER — TRANEXAMIC ACID-NACL 1000-0.7 MG/100ML-% IV SOLN
1000.0000 mg | INTRAVENOUS | Status: AC
  Administered 2021-04-17: 1000 mg via INTRAVENOUS

## 2021-04-17 MED ORDER — LIDOCAINE HCL (PF) 2 % IJ SOLN
INTRAMUSCULAR | Status: AC
  Filled 2021-04-17: qty 10

## 2021-04-17 MED ORDER — SODIUM CHLORIDE 0.9% FLUSH: 3.0000 mL | INTRAVENOUS | Status: DC | PRN

## 2021-04-17 MED ORDER — FENTANYL CITRATE (PF) 100 MCG/2ML IJ SOLN
INTRAMUSCULAR | Status: DC | PRN
  Administered 2021-04-17: 100 ug via EPIDURAL

## 2021-04-17 MED ORDER — LABETALOL HCL 5 MG/ML IV SOLN
INTRAVENOUS | Status: AC
  Administered 2021-04-17: 20 mg via INTRAVENOUS
  Filled 2021-04-17: qty 4

## 2021-04-17 SURGICAL SUPPLY — 38 items
CANISTER PREVENA PLUS 150 (CANNISTER) ×2 IMPLANT
CHLORAPREP W/TINT 26ML (MISCELLANEOUS) ×2 IMPLANT
CLAMP CORD UMBIL (MISCELLANEOUS) IMPLANT
CLOTH BEACON ORANGE TIMEOUT ST (SAFETY) ×2 IMPLANT
DRESSING PREVENA PLUS CUSTOM (GAUZE/BANDAGES/DRESSINGS) ×1 IMPLANT
DRSG OPSITE POSTOP 4X10 (GAUZE/BANDAGES/DRESSINGS) ×2 IMPLANT
DRSG PREVENA PLUS CUSTOM (GAUZE/BANDAGES/DRESSINGS) ×2
ELECT REM PT RETURN 9FT ADLT (ELECTROSURGICAL) ×2
ELECTRODE REM PT RTRN 9FT ADLT (ELECTROSURGICAL) ×1 IMPLANT
EXTRACTOR VACUUM M CUP 4 TUBE (SUCTIONS) IMPLANT
GLOVE BIOGEL PI IND STRL 7.0 (GLOVE) ×3 IMPLANT
GLOVE BIOGEL PI INDICATOR 7.0 (GLOVE) ×3
GLOVE ECLIPSE 7.0 STRL STRAW (GLOVE) ×2 IMPLANT
GOWN STRL REUS W/TWL LRG LVL3 (GOWN DISPOSABLE) ×4 IMPLANT
HEMOSTAT SURGICEL 4X8 (HEMOSTASIS) ×2 IMPLANT
KIT ABG SYR 3ML LUER SLIP (SYRINGE) IMPLANT
MAT PREVALON FULL STRYKER (MISCELLANEOUS) ×4 IMPLANT
NEEDLE HYPO 22GX1.5 SAFETY (NEEDLE) ×2 IMPLANT
NEEDLE HYPO 25X5/8 SAFETYGLIDE (NEEDLE) ×2 IMPLANT
NS IRRIG 1000ML POUR BTL (IV SOLUTION) ×2 IMPLANT
PACK C SECTION WH (CUSTOM PROCEDURE TRAY) ×2 IMPLANT
PAD ABD 7.5X8 STRL (GAUZE/BANDAGES/DRESSINGS) ×2 IMPLANT
PAD OB MATERNITY 4.3X12.25 (PERSONAL CARE ITEMS) ×2 IMPLANT
PENCIL SMOKE EVAC W/HOLSTER (ELECTROSURGICAL) ×2 IMPLANT
RETRACTOR TRAXI PANNICULUS (MISCELLANEOUS) ×3 IMPLANT
RTRCTR C-SECT PINK 25CM LRG (MISCELLANEOUS) IMPLANT
SUT PDS AB 0 CTX 36 PDP370T (SUTURE) IMPLANT
SUT PDS AB 0 CTX 60 (SUTURE) ×4 IMPLANT
SUT PLAIN 2 0 XLH (SUTURE) ×4 IMPLANT
SUT VIC AB 0 CTX 36 (SUTURE) ×2
SUT VIC AB 0 CTX36XBRD ANBCTRL (SUTURE) ×2 IMPLANT
SUT VIC AB 4-0 KS 27 (SUTURE) ×2 IMPLANT
SUT VIC AB 4-0 PS2 27 (SUTURE) ×2 IMPLANT
SYR CONTROL 10ML LL (SYRINGE) ×2 IMPLANT
TOWEL OR 17X24 6PK STRL BLUE (TOWEL DISPOSABLE) ×2 IMPLANT
TRAXI PANNICULUS RETRACTOR (MISCELLANEOUS) ×3
TRAY FOLEY W/BAG SLVR 14FR LF (SET/KITS/TRAYS/PACK) ×2 IMPLANT
WATER STERILE IRR 1000ML POUR (IV SOLUTION) ×2 IMPLANT

## 2021-04-17 NOTE — Progress Notes (Signed)
Interim Night Progress Note  S: Received call by nurse regarding minimal urine output into foley bag at the start of shift.   Pt also has several questions about care. States that she was asked about being on metformin and having diabetes. States she has never had diabetes and did not have GDM this pregnancy. She was unsure if this is why her baby's glucose levels were being so frequently checked.   She also voiced questions about neurontin prescription. States she does not want neurontin as she has been prescribed it in the past and it made her feel "loopy". Her pain is minimal/absent and well controlled with current medications.  O: Today's Vitals   04/17/21 2044 04/17/21 2152 04/17/21 2155 04/17/21 2158  BP: 135/86 136/73 (!) 145/89 134/85  Pulse: 73 83 79 91  Resp: 20     Temp: 98.6 F (37 C)     TempSrc: Oral     SpO2: 97%     Weight:      Height:      PainSc:       Physical exam: Gen: comfortable appearing, no acute distress CV: RRR, no murmurs auscultated Lungs: CTAB Extremities: no signs of DVT, trace pitting edema   A/P: Minimal urine output: Encouraged pt to continue oral fluid intake. As long as patient is capable in taking fluids orally, I would prefer not to give fluids IV. Will continue to monitor urine output.   Question of pmhx: Encouraged patient that she does not have diabetes. This may have been a mix up as she shares her name with her mother who was recently diagnosed with diabetes and started on metformin. Advised mother that glucose checks on her baby are likely due to low glucose (31>>46) at the time of initial check and they would continue to monitor this to prevent severe hypoglycemia. Encouraged her to discuss with pediatrician regarding diabetes history as I am unsure who discussed metformin with her.  Pain: pain minimal/absent right now not requiring medication. Discontinued neurontin prescription. Advised pt that she does not need to take any pain  medications she does not want to. She may only need tylenol and ibuprofen for pain management.  Shelby Mattocks, DO 04/17/2021, 11:12 PM PGY-1, [facility]

## 2021-04-17 NOTE — Anesthesia Postprocedure Evaluation (Signed)
Anesthesia Post Note  Patient: Summer Terrell  Procedure(s) Performed: CESAREAN SECTION     Patient location during evaluation: PACU Anesthesia Type: Epidural Level of consciousness: oriented and awake and alert Pain management: pain level controlled Vital Signs Assessment: post-procedure vital signs reviewed and stable Respiratory status: spontaneous breathing, respiratory function stable and nonlabored ventilation Cardiovascular status: blood pressure returned to baseline and stable Postop Assessment: no headache, no backache, no apparent nausea or vomiting and epidural receding Anesthetic complications: no   No notable events documented.  Last Vitals:  Vitals:   04/17/21 1941 04/17/21 2044  BP: (!) 156/71 135/86  Pulse: 81 73  Resp: 18 20  Temp: 36.9 C 37 C  SpO2: 96% 97%    Last Pain:  Vitals:   04/17/21 2044  TempSrc: Oral  PainSc:    Pain Goal:                   Lucretia Kern

## 2021-04-17 NOTE — Transfer of Care (Signed)
Immediate Anesthesia Transfer of Care Note  Patient: Summer Terrell  Procedure(s) Performed: CESAREAN SECTION  Patient Location: PACU  Anesthesia Type:Epidural  Level of Consciousness: awake, alert  and oriented  Airway & Oxygen Therapy: Patient Spontanous Breathing  Post-op Assessment: Report given to RN and Post -op Vital signs reviewed and stable  Post vital signs: Reviewed and stable  Last Vitals:  Vitals Value Taken Time  BP 133/92 04/17/21 1605  Temp 36.8 C 04/17/21 1605  Pulse 77 04/17/21 1615  Resp 17 04/17/21 1615  SpO2 96 % 04/17/21 1615  Vitals shown include unvalidated device data.  Last Pain:  Vitals:   04/17/21 1605  TempSrc:   PainSc: 0-No pain         Complications: No notable events documented.

## 2021-04-17 NOTE — Progress Notes (Signed)
Summer Terrell is a 35 y.o. G3P1011 at [redacted]w[redacted]d admitted for induction of labor due to [facility].  Subjective: Feeling contractions. Denies headaches and vision changes.   Objective: BP (!) 132/100   Pulse 96   Temp 98.2 F (36.8 C) (Oral)   Resp 16   Ht 5\' 9"  (1.753 m)   Wt (!) 157.4 kg   LMP 07/31/2020   SpO2 97%   BMI 51.24 kg/m  I/O last 3 completed shifts: In: 408.9 [I.V.:408.9] Out: -  No intake/output data recorded.  SVE:   Dilation: 4 Effacement (%): 70 Station: -2 Exam by:: [email or web address], RN  Labs: Lab Results  Component Value Date   WBC 14.4 (H) 04/16/2021   HGB 9.7 (L) 04/16/2021   HCT 31.3 (L) 04/16/2021   MCV 78.8 (L) 04/16/2021   PLT 268 04/16/2021    Assessment / Plan: Induction of labor due to gestational hypertension Pitocin uptitrated since 1600 yesterday - SROM this AM 0500, IUPC with adequate contractions since ~0800 (so greater than 4 hours) and cervix continues to be unchanged. Given adequate contractions for atleast 4 hours without cervix change meets criteria for arrest of dilation and we will move towards a Cesarean section  Discussed with patient who agrees with plan. Patient consented for CS by Dr. [id number] and Dr. Ephriam Jenkins.  The risks of cesarean section were discussed with the patient including but were not limited to: bleeding which may require transfusion or reoperation; infection which may require antibiotics; injury to bowel, bladder, ureters or other surrounding organs; injury to the fetus; need for additional procedures including hysterectomy in the event of a life-threatening hemorrhage; placental abnormalities wth subsequent pregnancies, incisional problems, thromboembolic phenomenon and other postoperative/anesthesia complications.  Anesthesia and OR aware.  Preoperative prophylactic antibiotics and SCDs ordered on call to the OR.  To OR when ready.   - Azithro and cefazolin ordered - IV TXA ordered - To OR once ready  Macon Large 04/17/2021,  1:14 PM

## 2021-04-17 NOTE — Op Note (Signed)
Cottie Banda PROCEDURE DATE: 04/17/2021  PREOPERATIVE DIAGNOSES: Intrauterine pregnancy at [redacted]w[redacted]d weeks gestation; failure to progress: arrest of dilation and failed attempt of trial of labor after previous cesarean section; morbid obesity  POSTOPERATIVE DIAGNOSES: The same  PROCEDURE: Low Transverse Cesarean Section  SURGEON:  Dr. Jaynie Collins  ASSISTANT:  Dr. Warner Mccreedy  ANESTHESIOLOGY TEAM: Anesthesiologist: Lowella Curb, MD; Lucretia Kern, MD  INDICATIONS: Summer Terrell is a 35 y.o. [phone] at [redacted]w[redacted]d here for cesarean section secondary to the indications listed under preoperative diagnoses; please see preoperative note for further details.  The risks of surgery were discussed with the patient including but were not limited to: bleeding which may require transfusion or reoperation; infection which may require antibiotics; injury to bowel, bladder, ureters or other surrounding organs; injury to the fetus; need for additional procedures including hysterectomy in the event of a life-threatening hemorrhage; formation of adhesions; placental abnormalities wth subsequent pregnancies; incisional problems; thromboembolic phenomenon and other postoperative/anesthesia complications.  The patient concurred with the proposed plan, giving informed written consent for the procedure.    FINDINGS:  Viable female infant in cephalic presentation.  Apgars 8 and 8.  Loose nuchal cord x 1. Clear amniotic fluid.  Intact placenta, three vessel cord.  Normal uterus, fallopian tubes and ovaries bilaterally. Procedure was technically difficulty secondary to patient's habitus.  Prevena was placed over incision at the end of the case.  ANESTHESIA: Epidural  INTRAVENOUS FLUIDS: 1062 ml   ESTIMATED BLOOD LOSS: 752 ml URINE OUTPUT:  100 ml SPECIMENS: Placenta sent to L&D COMPLICATIONS: None immediate  PROCEDURE IN DETAIL:  The patient preoperatively received intravenous antibiotics and had sequential  compression devices applied to her lower extremities.  She was then taken to the operating room where the epidural anesthesia was dosed up to surgical level and was found to be adequate. She was then placed in a dorsal supine position with a leftward tilt, and prepped and draped in a sterile manner.  A foley catheter was placed into her bladder and attached to constant gravity.  After an adequate timeout was performed, a Pfannenstiel skin incision was made with scalpel on her preexisting scarand carried through to the underlying layer of fascia. The fascia was incised in the midline, and this incision was extended bilaterally using the Mayo scissors.  Kocher clamps were applied to the superior aspect of the fascial incision and the underlying rectus muscles were dissected off bluntly and sharply.  A similar process was carried out on the inferior aspect of the fascial incision. The rectus muscles were separated in the midline and the peritoneum was entered bluntly. The Alexis self-retaining retractor was introduced into the abdominal cavity.  Attention was turned to the lower uterine segment where a low transverse hysterotomy was made with a scalpel and extended bilaterally bluntly.  The infant was successfully delivered, the cord was clamped and cut after one minute, and the infant was handed over to the awaiting neonatology team. Uterine massage was then administered, and the placenta delivered intact with a three-vessel cord. The uterus was then cleared of clots and debris.  The hysterotomy was closed with 0 Vicryl in a running locked fashion, and an imbricating layer was also placed with 0 Vicryl.  Figure-of-eight 0 Vicryl serosal stitches were placed to help with hemostasis.  The pelvis was cleared of all clot and debris. Hemostasis was confirmed on all surfaces.  The retractor was removed.  The peritoneum and the rectus muscles were reapproximated using 0  Vicryl interrupted stitches. The fascia was then closed  using looped 0 PDS in a running fashion.  The subcutaneous layer was irrigated, reapproximated with 2-0 plain gut running stitches in two layers due to the depth of the subcutaneous tissues, and the skin was closed with a 4-0 Vicryl subcuticular stitch.  After the skin was closed, a Prevena disposable negative pressure wound therapy device was placed over the incision.  The suction was activated at a pressure of -125 mmHg.  The adhesive was affixed well and there were no leaks noted. The patient tolerated the procedure well. Sponge, instrument and needle counts were correct x 3.  She was taken to the recovery room in stable condition.    Jaynie Collins, MD, FACOG Obstetrician & Gynecologist, [facility] for [employer], [facility]

## 2021-04-17 NOTE — Progress Notes (Addendum)
Summer Terrell is a 35 y.o. G3P1011 at [redacted]w[redacted]d by LMP admitted for induction of labor due to Hypertension.  Subjective: SROM @ 0500. Evaluate for placement of IUPC and FSE  Objective: BP 119/69   Pulse 93   Temp 98.1 F (36.7 C) (Oral)   Resp 18   Ht 5\' 9"  (1.753 m)   Wt (!) 157.4 kg   LMP 07/31/2020   SpO2 97%   BMI 51.24 kg/m  I/O last 3 completed shifts: In: 408.9 [I.V.:408.9] Out: -  No intake/output data recorded.  FHT:  FHR: 145 bpm, variability: moderate,  accelerations:  Present,  decelerations:  Absent UC:   regular, every 2-3 minutes SVE:   Dilation: 4 Effacement (%): 70 Station: -2, -3 Exam by:: [email or web address] CNM IUPC placed without difficulty Pitocin @ 57mU  Labs: Lab Results  Component Value Date   WBC 14.4 (H) 04/16/2021   HGB 9.7 (L) 04/16/2021   HCT 31.3 (L) 04/16/2021   MCV 78.8 (L) 04/16/2021   PLT 268 04/16/2021    Assessment / Plan: Induction of labor due to gestational hypertension,  progressing well on pitocin  Labor: Progressing on Pitocin Preeclampsia:  labs stable Fetal Wellbeing:  Category I Pain Control:  Epidural I/D:  n/a Anticipated MOD:  NSVD - TOLAC  04/18/2021, CNM 04/17/2021, 7:57 AM

## 2021-04-17 NOTE — Progress Notes (Signed)
Summer Terrell is a 35 y.o. G3P1011 at [redacted]w[redacted]d by LMP admitted for induction of labor due to Hypertension.  Subjective:   Objective: BP 134/80   Pulse 90   Temp 97.8 F (36.6 C) (Oral)   Resp 18   Ht 5\' 9"  (1.753 m)   Wt (!) 157.4 kg   LMP 07/31/2020   SpO2 97%   BMI 51.24 kg/m  I/O last 3 completed shifts: In: 408.9 [I.V.:408.9] Out: -  No intake/output data recorded.  FHT:  FHR: 145 bpm, variability: minimal ,  accelerations:  Abscent,  decelerations:  Absent UC:   regular, every 3-4 minutes SVE:   Dilation: 3 Effacement (%): 60 Station: -3 Exam by:: R. [email or web address]  Labs: Lab Results  Component Value Date   WBC 14.4 (H) 04/16/2021   HGB 9.7 (L) 04/16/2021   HCT 31.3 (L) 04/16/2021   MCV 78.8 (L) 04/16/2021   PLT 268 04/16/2021    Assessment / Plan: Induction of labor due to gestational hypertension,  progressing well on pitocin  Labor: Progressing on Pitocin, will continue to increase then AROM Preeclampsia:  labs stable Fetal Wellbeing:  Category I Pain Control:  Epidural I/D:  n/a Anticipated MOD:  NSVD - TOLAC  04/18/2021, CNM 04/17/2021, 1:45 AM

## 2021-04-17 NOTE — Lactation Note (Signed)
This note was copied from a baby's chart. Lactation Consultation Note  Patient Name: Summer Terrell [id number]'[id number] Date: 04/17/2021 Reason for consult: Initial assessment;Early term 37-38.6wks;Maternal endocrine disorder Age:34 hours  Infant had 31 glucose.  Baby latched and LC assisted with supporting mom's breast with rolled blankets.  Infant had rhythmic sucking and deep latch.  Mom was on the phone with her daughter when we were reviewing hand expression.    Mom observed while LC hand expressed.  9 mls easily collected and spoon fed to infant.  Mom had several questions and all were answered.  She will return to work at 8 weeks pp.  Baby came off the breast satisfied and was held STS after spoon feeding.  Lab at bedside to draw glucose.    Mom will call out for assistance with latching and hand expressing if needed.    Maternal Data Has patient been taught Hand Expression?: Yes Does the patient have breastfeeding experience prior to this delivery?: Yes How long did the patient breastfeed?: pumped for 4 months in conjuction with formula feeding  Feeding Mother's Current Feeding Choice: Breast Milk  LATCH Score Latch: Repeated attempts needed to sustain latch, nipple held in mouth throughout feeding, stimulation needed to elicit sucking reflex.  Audible Swallowing: A few with stimulation  Type of Nipple: Everted at rest and after stimulation  Comfort (Breast/Nipple): Soft / non-tender  Hold (Positioning): Assistance needed to correctly position infant at breast and maintain latch.  LATCH Score: 7   Lactation Tools Discussed/Used Tools: Pump (spoon) Breast pump type: Manual (mom requested hand pump/ parts reviewed and cleaning instructions provided)  Interventions Interventions: Breast feeding basics reviewed;Assisted with latch;Skin to skin;Hand express;Breast compression;Adjust position;Support pillows;Position options;Expressed milk;Education  Discharge Pump:  Personal Occupational hygienist [name]) WIC Program: Yes  Consult Status Consult Status: Follow-up Date: 04/18/21 Follow-up type: In-patient    Maryruth [facility] 04/17/2021, 9:49 PM

## 2021-04-17 NOTE — Discharge Summary (Signed)
Postpartum Discharge Summary  Date of Service updated 04/19/2021      Patient Name: Summer Terrell DOB: 23-Feb-1986 MRN: [id number]  Date of admission: 04/16/2021 Delivery date:04/17/2021  Delivering provider: Verita Schneiders A  Date of discharge: 04/19/2021  Admitting diagnosis: Gestational hypertension [O13.9] Intrauterine pregnancy: [redacted]w[redacted]d    Secondary diagnosis:  Active Problems:   S/P cesarean section   Encounter for supervision of normal pregnancy, antepartum   [place]   Gestational hypertension   Preeclampsia in postpartum period  Additional problems: None    Discharge diagnosis: Term Pregnancy Delivered, Gestational Hypertension, and Preeclampsia (mild)                                              Post partum procedures: none Augmentation: Pitocin and IP Foley Complications: None  Hospital course: Induction of Labor With Cesarean Section   35y.o. yo [id number] admitted to the hospital 04/16/2021 for induction of labor. Patient had a labor course significant for induction of labor with low dose pitocin and foley bulb, and increased pitocin once foley out. Patient had IUPC placed which measured adequate contractions for >4 hrs without change in cervical dilation. Subsequently, the patient went for repeat cesarean section due to Arrest of Dilation. Delivery details are as follows: Membrane Rupture Time/Date: 5:00 AM ,04/17/2021   Delivery Method:C-Section, Low Transverse  Details of operation can be found in separate operative Note.  Patient had an uncomplicated postpartum course. She is ambulating, tolerating a regular diet, passing flatus, and urinating well.  Patient is discharged home in stable condition on 04/19/21.      Newborn Data: Birth date:04/17/2021  Birth time:2:27 PM  Gender:Female  Living status:Living  Apgars:8 ,8  Weight:3290 g                                Magnesium Sulfate received: No BMZ received:  No Rhophylac:N/A MMR:N/A T-DaP:Given prenatally Flu: Yes - given prenatally Transfusion:No  Physical exam  Vitals:   04/18/21 2149 04/19/21 0503 04/19/21 1345 04/19/21 1500  BP: 127/70 130/80 (!) 143/86 135/75  Pulse: (!) 102 91 88 91  Resp: 20 20  18   Temp: 97.8 F (36.6 C) 97.8 F (36.6 C)  (!) 97.4 F (36.3 C)  TempSrc: Oral Oral  Oral  SpO2: 98% 99%  100%  Weight:      Height:       General: alert, cooperative, and no distress Lochia: appropriate Uterine Fundus: firm Incision: Healing well with no significant drainage, No significant erythema, Wound vac intact with good suction without significant bloody drainage DVT Evaluation: No evidence of DVT seen on physical exam. Negative Homan's sign. Labs: Lab Results  Component Value Date   WBC 10.7 (H) 04/19/2021   HGB 8.2 (L) 04/19/2021   HCT 26.0 (L) 04/19/2021   MCV 78.8 (L) 04/19/2021   PLT 308 04/19/2021   CMP Latest Ref Rng & Units 04/19/2021  Glucose 70 - 99 mg/dL 98  BUN 6 - 20 mg/dL 9  Creatinine 0.44 - 1.00 mg/dL 0.68  Sodium 135 - 145 mmol/L 136  Potassium 3.5 - 5.1 mmol/L 4.0  Chloride 98 - 111 mmol/L 105  CO2 22 - 32 mmol/L 23  Calcium 8.9 - 10.3 mg/dL 8.3(L)  Total Protein 6.5 -  8.1 g/dL 5.4(L)  Total Bilirubin 0.3 - 1.2 mg/dL 0.5  Alkaline Phos 38 - 126 U/L 165(H)  AST 15 - 41 U/L 20  ALT 0 - 44 U/L 17   Edinburgh Score: Edinburgh Postnatal Depression Scale Screening Tool 04/18/2021  I have been able to laugh and see the funny side of things. 0  I have looked forward with enjoyment to things. 0  I have blamed myself unnecessarily when things went wrong. 1  I have been anxious or worried for no good reason. 2  I have felt scared or panicky for no good reason. 1  Things have been getting on top of me. 1  I have been so unhappy that I have had difficulty sleeping. 1  I have felt sad or miserable. 0  I have been so unhappy that I have been crying. 0  The thought of harming myself has occurred to  me. 0  Edinburgh Postnatal Depression Scale Total 6     After visit meds:  Allergies as of 04/19/2021       Reactions   Rocephin [ceftriaxone Sodium In Dextrose] Anaphylaxis, Hives, Shortness Of Breath        Medication List     STOP taking these medications    Doxylamine-Pyridoxine 10-10 MG Tbec Commonly known as: Diclegis   metoCLOPramide 10 MG tablet Commonly known as: REGLAN   ondansetron 4 MG tablet Commonly known as: ZOFRAN   promethazine 25 MG tablet Commonly known as: PHENERGAN       TAKE these medications    acetaminophen 500 MG tablet Commonly known as: TYLENOL Take 2 tablets (1,000 mg total) by mouth every 6 (six) hours. What changed:  medication strength how much to take when to take this   aspirin 81 MG EC tablet Take 1 tablet (81 mg total) by mouth daily. Swallow whole.   ferrous sulfate 325 (65 FE) MG tablet Take 1 tablet (325 mg total) by mouth every other day.   furosemide 20 MG tablet Commonly known as: LASIX Take 1 tablet (20 mg total) by mouth daily for 4 days. Start taking on: April 20, 2021   ibuprofen 600 MG tablet Commonly known as: ADVIL Take 1 tablet (600 mg total) by mouth every 6 (six) hours.   NIFEdipine 30 MG 24 hr tablet Commonly known as: ADALAT CC Take 1 tablet (30 mg total) by mouth daily. Start taking on: April 20, 2021   oxyCODONE 5 MG immediate release tablet Commonly known as: Oxy IR/ROXICODONE Take 1-2 tablets (5-10 mg total) by mouth every 4 (four) hours as needed for moderate pain.   PRENATAL VITAMIN PO Take by mouth.   senna-docusate 8.6-50 MG tablet Commonly known as: Senokot-S Take 2 tablets by mouth daily.         Discharge home in stable condition Infant Feeding: Breast Infant Disposition:home with mother Discharge instruction: per After Visit Summary and Postpartum booklet. Activity: Advance as tolerated. Pelvic rest for 6 weeks.  Diet: routine diet Future Appointments: Future  Appointments  Date Time Provider Virgie  04/26/2021  9:30 AM [facility] NURSE [facility]  05/24/2021  9:30 AM Roma Schanz, CNM [facility]   Follow up Visit: Message sent to FT by Dr. Cy Blamer on 10/23  Please schedule this patient for a In person postpartum visit in 4 weeks with the following provider: MD. Additional Postpartum F/U:Incision check 1 week and BP check 1 week with wound vac removal. High risk pregnancy complicated by: HTN, GHTN->preeclampsia without severe  features. Delivery mode:  C-Section, Low Transverse  Anticipated Birth Control:   COndoms   04/19/2021 Katherine Basset, DO

## 2021-04-18 ENCOUNTER — Encounter (HOSPITAL_COMMUNITY): Payer: Self-pay | Admitting: Obstetrics & Gynecology

## 2021-04-18 LAB — CBC
HCT: 27.2 % — ABNORMAL LOW (ref 36.0–46.0)
Hemoglobin: 8.5 g/dL — ABNORMAL LOW (ref 12.0–15.0)
MCH: 24.7 pg — ABNORMAL LOW (ref 26.0–34.0)
MCHC: 31.3 g/dL (ref 30.0–36.0)
MCV: 79.1 fL — ABNORMAL LOW (ref 80.0–100.0)
Platelets: 266 K/uL (ref 150–400)
RBC: 3.44 MIL/uL — ABNORMAL LOW (ref 3.87–5.11)
RDW: 16.6 % — ABNORMAL HIGH (ref 11.5–15.5)
WBC: 12.7 K/uL — ABNORMAL HIGH (ref 4.0–10.5)
nRBC: 0 % (ref 0.0–0.2)

## 2021-04-18 LAB — COMPREHENSIVE METABOLIC PANEL
ALT: 9 U/L (ref 0–44)
AST: 16 U/L (ref 15–41)
Albumin: 1.8 g/dL — ABNORMAL LOW (ref 3.5–5.0)
Alkaline Phosphatase: 167 U/L — ABNORMAL HIGH (ref 38–126)
Anion gap: 8 (ref 5–15)
BUN: 9 mg/dL (ref 6–20)
CO2: 24 mmol/L (ref 22–32)
Calcium: 8.5 mg/dL — ABNORMAL LOW (ref 8.9–10.3)
Chloride: 102 mmol/L (ref 98–111)
Creatinine, Ser: 0.77 mg/dL (ref 0.44–1.00)
GFR, Estimated: >60 mL/min (ref >60)
Glucose, Bld: 93 mg/dL (ref 70–99)
Potassium: 4.1 mmol/L (ref 3.5–5.1)
Sodium: 134 mmol/L — ABNORMAL LOW (ref 135–145)
Total Bilirubin: 0.3 mg/dL (ref 0.3–1.2)
Total Protein: 5.1 g/dL — ABNORMAL LOW (ref 6.5–8.1)

## 2021-04-18 NOTE — Lactation Note (Signed)
This note was copied from a baby's chart. Lactation Consultation Note  Patient Name: Summer Terrell Date: 04/18/2021 Reason for consult: Follow-up assessment;Infant weight loss;Other (Comment) (2 % weight loss / baby asleep / mom recently fed 1 ml of EBM/ and had several feedings during the night. mom has been using the hand pump /  EBM yield ( feeding back to baby). LC recommended giving the baby practice on latching and call for feeding cues)LC reviewed the doc flow sheets with mom/ WNL for age . Latch scores have been up and down and need to be reassessed.  Age:11 hours  Maternal Data    Feeding Mother's Current Feeding Choice: Breast Milk Nipple Type: Slow - flow  LATCH Score                    Lactation Tools Discussed/Used Tools: Pump;Flanges Flange Size: 24 Breast pump type: Manual Pump Education: Milk Storage  Interventions Interventions: Breast feeding basics reviewed;Education;Hand pump  Discharge    Consult Status Consult Status: Follow-up Date: 04/18/21 Follow-up type: In-patient    Matilde Sprang Devin Foskey 04/18/2021, 10:53 AM

## 2021-04-18 NOTE — Lactation Note (Signed)
This note was copied from a baby's chart. Lactation Consultation Note  Patient Name: Summer Terrell [id number] Date: 04/18/2021 Reason for consult: Follow-up assessment;Mother's request;Difficult latch;Early term 37-38.6wks;Breastfeeding assistance Age:34 hours  LC came to assist with latching. Infant irritated needing some volume to calm down before latch. Infant latched in cross cradle prone able to sustain the latch and not pop on and off.   Plan 1. To feed based on cues 8-12x 24 hr period. Mom to offer breasts and look for signs of milk transfer.  2. Mom to supplement with EBM offering with pace bottle feeding with slow flow nipple (7-28ml ) Infant needs more volume.  3. DEBP q 3hrs for   All questions answered at the end of the feeding.   Maternal Data Has patient been taught Hand Expression?: Yes  Feeding Mother's Current Feeding Choice: Breast Milk  LATCH Score Latch: Repeated attempts needed to sustain latch, nipple held in mouth throughout feeding, stimulation needed to elicit sucking reflex.  Audible Swallowing: Spontaneous and intermittent  Type of Nipple: Everted at rest and after stimulation  Comfort (Breast/Nipple): Soft / non-tender  Hold (Positioning): Assistance needed to correctly position infant at breast and maintain latch.  LATCH Score: 8   Lactation Tools Discussed/Used Tools: Pump;Flanges Flange Size: 27 Breast pump type: Double-Electric Breast Pump Pump Education: Setup, frequency, and cleaning;Milk Storage Reason for Pumping: increase stimulation Pumping frequency: every 3 hs for 15 min  Interventions Interventions: Breast feeding basics reviewed;Support pillows;Education;Assisted with latch;Position options;Pace feeding;Skin to skin;Expressed milk;Breast massage;Hand express;Breast compression;DEBP;Adjust position;Visual merchandiser education  Discharge    Consult Status Consult Status: Follow-up Date:  04/19/21 Follow-up type: In-patient    Summer Pepitone  Terrell 04/18/2021, 6:19 PM

## 2021-04-18 NOTE — Lactation Note (Signed)
This note was copied from a baby's chart. Lactation Consultation Note  Patient Name: Summer Terrell [id number] Date: 04/18/2021 Reason for consult: Follow-up assessment;Infant weight loss Age:35 hours AS LC entered the room / MBURN had assisted mom to latch the baby.  LC checked latch / depth noted and swallows. LC showed mom how to use compressions to increase flow of milk / increased swallows noted.  Baby released at 12 mins , nipple well rounded and baby was fussy.  LC checked the diaper / it was dry/ and LC assisted to re- latch and baby fed 7 mins  With increased swallows and fell asleep. Latch score 9.  LC recommended continue to feed with cues ( 8-12 times a day) STS .  Since mom is already using her hand pump / if EBM feed back to baby. Maternal Data    Feeding Mother's Current Feeding Choice: Breast Milk Nipple Type: Slow - flow  LATCH Score - See doc flow sheets for 2nd latch.  Latch:  (latched with depth)  Audible Swallowing:  (increased swallows with compressions)     Comfort (Breast/Nipple):  (per mom comfortable)  Hold (Positioning):  [facility] assisted with latch)      Lactation Tools Discussed/Used Tools: Pump;Flanges Flange Size: 24 Breast pump type: Manual Pump Education: Milk Storage  Interventions Interventions: Breast feeding basics reviewed;Education  Discharge    Consult Status Consult Status: Follow-up Date: 04/19/21 Follow-up type: In-patient    Matilde Sprang Normagene Harvie 04/18/2021, 12:41 PM

## 2021-04-18 NOTE — Progress Notes (Signed)
Post Partum Day 1  Subjective: Summer Terrell is doing well this morning. Pain is well-controlled with PO meds. She has no concerns. She is up ad lib, voiding, tolerating PO, and + flatus. Breastfeeding is going well.  Objective: Blood pressure 109/60, pulse 86, temperature 97.9 F (36.6 C), temperature source Oral, resp. rate 20, height 5\' 9"  (1.753 m), weight (!) 157.4 kg, last menstrual period 07/31/2020, SpO2 97 %, unknown if currently breastfeeding.  Physical Exam:  General: alert and no distress Lochia: appropriate Uterine Fundus: firm Incision: healing well, no significant drainage, no dehiscence, no significant erythema DVT Evaluation: No evidence of DVT seen on physical exam.  Recent Labs    04/17/21 1235 04/18/21 0420  HGB 10.1* 8.5*  HCT 32.7* 27.2*    Assessment/Plan: Plan for discharge tomorrow Continue Procardia XL. BP's normotensive throughout the night. Patient remains asymptomatic.  PO iron started. Asymptomatic.    LOS: 2 days    04/20/21, CNM 04/18/2021, 8:50 AM

## 2021-04-18 NOTE — Clinical Social Work Maternal (Signed)
CLINICAL SOCIAL WORK MATERNAL/CHILD NOTE  Patient Details  Name: Summer Terrell MRN: [id number] Date of Birth: [birth year]  Date:  04/18/2021  Clinical Social Worker Initiating Note:  Kathrin Greathouse,  Date/Time: Initiated:  04/18/21/1555     Child's Name:  Alvera Novel' [place] Parents:  Mother, Father Braulio Conte ZTI:WPYK. 18, 2001)   Need for Interpreter:  None   Reason for Referral:  Behavioral Health Concerns   Address:  [address] [place] [address]    Phone number:  [phone] (home)     Additional phone number:   Household Members/Support Persons (HM/SP):   Household Member/Support Person 1   HM/SP Name Relationship DOB or Age  HM/SP -[place] Daughter 9  HM/SP -2        HM/SP -3        HM/SP -4        HM/SP -5        HM/SP -6        HM/SP -7        HM/SP -8          Natural Supports (not living in the home):  Friends   Professional Supports: None   Employment: Animator   Type of Work: LPN   Education:  Other (comment) (Associates Degree)   Homebound arranged:    Museum/gallery curator Resources:  Medicaid   Other Resources:  Physicist, medical  , [place] Considerations Which May Impact Care:    Strengths:  Ability to meet basic needs  , Home prepared for child  , Pediatrician chosen   Psychotropic Medications:         Pediatrician:    [facility]  Pediatrician List:   [place] Other ([place])  [facility]      Pediatrician Fax Number:    Risk Factors/Current Problems:  Mental Health Concerns     Cognitive State:  Able to Concentrate  , Insightful  , Linear Thinking  , Alert     Mood/Affect:  Comfortable  , Relaxed  , Calm     CSW Assessment:CSW received consult for hx Bipolar and Anxiety. CSW met with MOB to offer support and complete assessment.    CSW met with MOB at bedside and introduced CSW role.  CSW observed MOB changing infants' diaper with support from her god daughter and FOB was asleep on couch. CSW offered to return at a different time. MOB presented calm and welcomed CSW to complete the assessment with FOB and her support present. CSW inquired how MOB has felt since giving birth. MOB expressed feeling good but exhausted due to a long L&D experience. MOB reported she felt grateful about the nap she was able to take today. CSW inquired how MOB felt during her pregnancy. MOB reported she was more tearful, cried a lot and towards the end of pregnancy felt anxious about the birth compared to her first experience 10 years ago. MOB thinks due to her age she was a lot more emotional. CSW disclosed she was diagnosed with bipolar and anxiety as a child. MOB shared it was triggered by her parents' separation. MOB shared over the years she has taken mood stabilizing medication Abilify, Zoloft and Xanax. She quit taking all medication about five years ago because she has been able to manage her symptoms without medication. MOB shared she learned her triggers,  so it makes it easier to deal with emotions and avoid her triggers. MOB shared she was offered counseling through the OBGYN clinic however she missed the virtual visit. MOB shared if she has concerns, she feels comfortable reaching out to her doctor's office. CSW provided education regarding the baby blues period vs. perinatal mood disorders, discussed treatment and gave resources for mental health follow up if concerns arise.  CSW recommended MOB complete a self-evaluation during the postpartum time period using the New Mom Checklist from Postpartum Progress and encouraged MOB to contact a medical professional if symptoms are noted at any time. MOB denied thoughts of harm to self and others. MOB identified her friend and FOB as supports.   CSW provided review of Sudden Infant Death Syndrome (SIDS) precautions. MOB shared she has essential items for the  infant including a bassinet where the infant will sleep. MOB has chosen [facility] [facility] for infant's follow up care. MOB reported she has transportation.  MOB reported she receives WIC/FS and plans to follow with the [facility] office. CSW assessed MOB for additional needs. MOB reported no further needs.   CSW identifies no further need for intervention and no barriers to discharge at this time.   CSW Plan/Description:  Sudden Infant Death Syndrome (SIDS) Education, Perinatal Mood and Anxiety Disorder (PMADs) Education, No Further Intervention Required/No Barriers to Discharge    Lia Hopping, LCSW 04/18/2021, 4:25 PM

## 2021-04-19 ENCOUNTER — Other Ambulatory Visit (HOSPITAL_COMMUNITY): Payer: Self-pay

## 2021-04-19 DIAGNOSIS — O165 Unspecified maternal hypertension, complicating the puerperium: Secondary | ICD-10-CM

## 2021-04-19 DIAGNOSIS — O1495 Unspecified pre-eclampsia, complicating the puerperium: Secondary | ICD-10-CM

## 2021-04-19 DIAGNOSIS — O09299 Supervision of pregnancy with other poor reproductive or obstetric history, unspecified trimester: Secondary | ICD-10-CM

## 2021-04-19 DIAGNOSIS — Z8679 Personal history of other diseases of the circulatory system: Secondary | ICD-10-CM

## 2021-04-19 DIAGNOSIS — Z8759 Personal history of other complications of pregnancy, childbirth and the puerperium: Secondary | ICD-10-CM

## 2021-04-19 LAB — COMPREHENSIVE METABOLIC PANEL
ALT: 17 U/L (ref 0–44)
AST: 20 U/L (ref 15–41)
Albumin: 1.9 g/dL — ABNORMAL LOW (ref 3.5–5.0)
Alkaline Phosphatase: 165 U/L — ABNORMAL HIGH (ref 38–126)
Anion gap: 8 (ref 5–15)
BUN: 9 mg/dL (ref 6–20)
CO2: 23 mmol/L (ref 22–32)
Calcium: 8.3 mg/dL — ABNORMAL LOW (ref 8.9–10.3)
Chloride: 105 mmol/L (ref 98–111)
Creatinine, Ser: 0.68 mg/dL (ref 0.44–1.00)
GFR, Estimated: >60 mL/min (ref >60)
Glucose, Bld: 98 mg/dL (ref 70–99)
Potassium: 4 mmol/L (ref 3.5–5.1)
Sodium: 136 mmol/L (ref 135–145)
Total Bilirubin: 0.5 mg/dL (ref 0.3–1.2)
Total Protein: 5.4 g/dL — ABNORMAL LOW (ref 6.5–8.1)

## 2021-04-19 LAB — CBC WITH DIFFERENTIAL/PLATELET
Abs Immature Granulocytes: 0.07 10*3/uL (ref 0.00–0.07)
Basophils Absolute: 0 10*3/uL (ref 0.0–0.1)
Basophils Relative: 0 %
Eosinophils Absolute: 0.3 10*3/uL (ref 0.0–0.5)
Eosinophils Relative: 2 %
HCT: 26 % — ABNORMAL LOW (ref 36.0–46.0)
Hemoglobin: 8.2 g/dL — ABNORMAL LOW (ref 12.0–15.0)
Immature Granulocytes: 1 %
Lymphocytes Relative: 15 %
Lymphs Abs: 1.6 10*3/uL (ref 0.7–4.0)
MCH: 24.8 pg — ABNORMAL LOW (ref 26.0–34.0)
MCHC: 31.5 g/dL (ref 30.0–36.0)
MCV: 78.8 fL — ABNORMAL LOW (ref 80.0–100.0)
Monocytes Absolute: 0.5 10*3/uL (ref 0.1–1.0)
Monocytes Relative: 4 %
Neutro Abs: 8.3 10*3/uL — ABNORMAL HIGH (ref 1.7–7.7)
Neutrophils Relative %: 78 %
Platelets: 308 10*3/uL (ref 150–400)
RBC: 3.3 MIL/uL — ABNORMAL LOW (ref 3.87–5.11)
RDW: 17.1 % — ABNORMAL HIGH (ref 11.5–15.5)
WBC: 10.7 10*3/uL — ABNORMAL HIGH (ref 4.0–10.5)
nRBC: 0 % (ref 0.0–0.2)

## 2021-04-19 MED ORDER — NIFEDIPINE ER 30 MG PO TB24
30.0000 mg | ORAL_TABLET | Freq: Every day | ORAL | 0 refills | Status: DC
  Filled 2021-04-19: qty 30, 30d supply, fill #0

## 2021-04-19 MED ORDER — SENNOSIDES-DOCUSATE SODIUM 8.6-50 MG PO TABS
2.0000 | ORAL_TABLET | Freq: Every day | ORAL | 0 refills | Status: DC
  Filled 2021-04-19: qty 20, 10d supply, fill #0

## 2021-04-19 MED ORDER — FUROSEMIDE 20 MG PO TABS
20.0000 mg | ORAL_TABLET | Freq: Every day | ORAL | 0 refills | Status: DC
  Filled 2021-04-19: qty 4, 4d supply, fill #0

## 2021-04-19 MED ORDER — FUROSEMIDE 20 MG PO TABS
20.0000 mg | ORAL_TABLET | Freq: Every day | ORAL | Status: DC
  Administered 2021-04-19: 20 mg via ORAL
  Filled 2021-04-19: qty 1

## 2021-04-19 MED ORDER — ACETAMINOPHEN 500 MG PO TABS
1000.0000 mg | ORAL_TABLET | Freq: Four times a day (QID) | ORAL | 0 refills | Status: DC
  Filled 2021-04-19: qty 30, 4d supply, fill #0

## 2021-04-19 MED ORDER — OXYCODONE HCL 5 MG PO TABS
5.0000 mg | ORAL_TABLET | ORAL | 0 refills | Status: DC | PRN
  Filled 2021-04-19: qty 20, 2d supply, fill #0

## 2021-04-19 MED ORDER — IBUPROFEN 600 MG PO TABS
600.0000 mg | ORAL_TABLET | Freq: Four times a day (QID) | ORAL | 0 refills | Status: DC
  Filled 2021-04-19: qty 30, 8d supply, fill #0

## 2021-04-19 NOTE — Lactation Note (Signed)
This note was copied from a baby's chart. Lactation Consultation Note  Patient Name: Summer Terrell Date: 04/19/2021 Reason for consult: Follow-up assessment;Early term 37-38.6wks;1st time breastfeeding Age:34 hours   P2 mother whose infant is now 19 hours old.  This is an ETI at 37+1 weeks.  Mother pumped and bottle fed her first child (now 82 years old) for 4 months before switching to formula.  RN requested latch assistance.  RN assisting mother with hand expression when I arrived.  After receiving approximately 6 mls of EBM assisted to latch in the cross cradle hold to the left breast.  Baby required repeated attempts to sustain a latch.  He became very frustrated at the breast and appeared hungry.  Burped and, with RN assistance, was able to express another 6 mls of EBM.  This helped calm him and I was able to latch to the alternate breast where he fed for 15 minutes.  Intermittent loud audible swallows noted.  Baby became more calm and sleepy.  Placed him in the bassinet and set mother up with the DEBP; she has not been pumping consistently.  Discussed the importance of this and mother will begin pumping every three hours.  #27 flanges are appropriate at this time.  During pumping baby began to be fussy again; continued to appear hungry.  Discussed supplementation and mother agreeable.  She desires formula since she will return to work in 6 weeks.  Awakened father to feed and baby consumed 24 mls using the gold slow flow nipple.  Encouraged burping after every 10 mls.  Burped well and content.  Mother able to pump 1 ml of EBM.  Placed this at bedside for the next feeding.  Wash station set up.  RN in room to administer pain medication.  Family will now rest.  Mother will call for further assistance as needed today.     Maternal Data Has patient been taught Hand Expression?: Yes Does the patient have breastfeeding experience prior to this delivery?: No (Mother pumped and bottle  fed for 4 months with first child)  Feeding Mother's Current Feeding Choice: Breast Milk and Formula  LATCH Score Latch: Repeated attempts needed to sustain latch, nipple held in mouth throughout feeding, stimulation needed to elicit sucking reflex.  Audible Swallowing: Spontaneous and intermittent  Type of Nipple: Everted at rest and after stimulation  Comfort (Breast/Nipple): Soft / non-tender  Hold (Positioning): Assistance needed to correctly position infant at breast and maintain latch.  LATCH Score: 8   Lactation Tools Discussed/Used Tools: Pump;Flanges Flange Size: 27 Breast pump type: Double-Electric Breast Pump;Manual Pump Education: Setup, frequency, and cleaning;Milk Storage (Reviewed) Reason for Pumping: Breast stimulation for supplementation Pumping frequency: Every three hours Pumped volume: 1 mL  Interventions Interventions: Breast feeding basics reviewed;Assisted with latch;Skin to skin;Breast massage;Hand express;Breast compression;Expressed milk;Position options;Support pillows;Adjust position;DEBP;Education  Discharge Pump: Personal;Manual;DEBP  Consult Status Consult Status: Follow-up Date: 04/20/21 Follow-up type: In-patient    Summer Terrell 04/19/2021, 5:08 AM

## 2021-04-19 NOTE — Progress Notes (Signed)
POSTPARTUM PROGRESS NOTE  Post Partum Day 2  Subjective:  Summer Terrell is a 35 y.o. [id number] s/p cesarean section at [redacted]w[redacted]d.  No acute events overnight.  Pt denies problems with ambulating, voiding or po intake.  She denies nausea or vomiting.  Pain is moderately controlled.  She has had flatus. She has had bowel movement.  Lochia Minimal. She reports that she is only producing colostrum and has had to supplement feeding with formula.   Objective: Blood pressure 130/80, pulse 91, temperature 97.8 F (36.6 C), temperature source Oral, resp. rate 20, height 5\' 9"  (1.753 m), weight (!) 157.4 kg, last menstrual period 07/31/2020, SpO2 99 %, unknown if currently breastfeeding.  Physical Exam:  General: alert, cooperative and no distress Chest: no respiratory distress Heart:regular rate, distal pulses intact Abdomen: soft, mildly tender surrounding incision,  Uterine Fundus: firm, appropriately tender DVT Evaluation: No calf swelling or tenderness Extremities: 1+ pitting edema in legs bilaterally Skin: warm, dry; incision clean/dry/intact  Recent Labs    04/17/21 1235 04/18/21 0420  HGB 10.1* 8.5*  HCT 32.7* 27.2*    Assessment/Plan: ALIENA GHRIST is a 35 y.o. 20 s/p cesarean section at [redacted]w[redacted]d   POD#2 - Doing well Contraception: will use condoms, considering progesterone-only OCP Feeding: breast feeding with formula supplementation Dispo: Pt expresses a desire to go home today   LOS: 3 days   [redacted]w[redacted]d, Mina Marble 04/19/2021, 7:40 AM

## 2021-04-19 NOTE — Lactation Note (Signed)
This note was copied from a baby's chart. Lactation Consultation Note  Patient Name: Summer Terrell [id number] Date: 04/19/2021 Reason for consult: Follow-up assessment;Early term 37-38.6wks;Maternal endocrine disorder Age:34 hours  LC in to visit with P2 Mom of ET infant on day of discharge.  Baby is at  6.5% weight loss with bilirubin level HIR.  Mom choosing to be discharged and f/u with Pediatrician tomorrow am.  Pecola Leisure is being supplemented with EBM and formula by bottle due to baby being fussy at the breast.  Mom has received help several times.  Mom has pumped but not as consistently as recommended.  Talked about the importance of keeping baby STS and consistent pumping.   Mom has the supplementing volume guidelines.  Mom has a hand's free Momcozy pump at home that she has ordered.  Recommended keeping all the pump tubing and parts.  Encouraged Mom to f/u with OP lactation appointment.  Mom declined but is aware of phone numbers on back of brochure.   Engorgement prevention and treatment reviewed.  Mom encouraged to call prn for concerns.  Lactation Tools Discussed/Used Tools: Pump;Bottle  Interventions Interventions: Breast feeding basics reviewed;Skin to skin;Breast massage;Hand express;DEBP;Hand pump;Education  Discharge Discharge Education: Engorgement and breast care;Outpatient recommendation Pump: Personal (Mom [name] hand's free pump)  Consult Status Consult Status: Complete Date: 04/19/21 Follow-up type: Call as needed    [name] 04/19/2021, 3:41 PM

## 2021-04-21 ENCOUNTER — Encounter: Payer: Medicaid Other | Admitting: Women's Health

## 2021-04-21 ENCOUNTER — Other Ambulatory Visit: Payer: Medicaid Other

## 2021-04-21 NOTE — BH Specialist Note (Signed)
Integrated Behavioral Health via Telemedicine Visit  04/21/2021 Summer Terrell [id number]  Number of Integrated Behavioral Health visits: 1 Session Start time: 1:18  Session End time: 2:38 Total time: 20  Referring Provider: Jacklyn Shell, CNM Patient/Family location: [facility] Provider location: [facility]  All persons participating in visit: Patient Summer Terrell and [facility] Velora Horstman   Types of Service: Individual psychotherapy and Video visit  I connected with Cottie Banda and/or Emmry L Althaus's  n/a  via  Telephone or Video Enabled Telemedicine Application  (Video is Caregility application) and verified that I am speaking with the correct person using two identifiers. Discussed confidentiality: Yes   I discussed the limitations of telemedicine and the availability of in person appointments.  Discussed there is a possibility of technology failure and discussed alternative modes of communication if that failure occurs.  I discussed that engaging in this telemedicine visit, they consent to the provision of behavioral healthcare and the services will be billed under their insurance.  Patient and/or legal guardian expressed understanding and consented to Telemedicine visit: Yes   Presenting Concerns: Patient and/or family reports the following symptoms/concerns: Anxiety,panic, poor sleep, poor appetite, guilt over needing help at home, heart palpitations; pt starts taking Buspar tonight; open to implementing self-coping strategy to manage emotions/help with sleep. Duration of problem: Postpartum increase; Severity of problem: moderate  Patient and/or Family's Strengths/Protective Factors: Social connections, Concrete supports in place (healthy food, safe environments, etc.), and Sense of purpose  Goals Addressed: Patient will:  Reduce symptoms of: anxiety and depression   Increase knowledge and/or ability  of: coping skills and healthy habits   Demonstrate ability to: Increase healthy adjustment to current life circumstances  Progress towards Goals: Ongoing  Interventions: Interventions utilized:  Mindfulness or Management consultant and Psychoeducation and/or Health Education Standardized Assessments completed: Edinburgh Postnatal Depression  Patient and/or Family Response: Pt agrees with treatment plan  Assessment: Patient currently experiencing Adjustment disorder with mixed anxious and depressed mood.   Patient may benefit from psychoeducation and brief therapeutic interventions regarding coping with symptoms of anxiety and depression .  Plan: Follow up with behavioral health clinician on : Two weeks;Call Takuya Lariccia at [phone], as needed. Behavioral recommendations:  -Continue taking prenatal vitamin with iron as recommended by medical provider -Begin taking Buspar as prescribed today -CALM relaxation breathing exercise twice daily (morning; at bedtime); as needed throughout the day for two weeks Referral(s): Integrated [employer] (In Clinic)  I discussed the assessment and treatment plan with the patient and/or parent/guardian. They were provided an opportunity to ask questions and all were answered. They agreed with the plan and demonstrated an understanding of the instructions.   They were advised to call back or seek an in-person evaluation if the symptoms worsen or if the condition fails to improve as anticipated.  Valetta Close Parilee Hally, LCSW  Edinburgh Postnatal Depression Scale Screening Tool 04/26/2021 04/18/2021  I have been able to laugh and see the funny side of things. 3 0  I have looked forward with enjoyment to things. 0 0  I have blamed myself unnecessarily when things went wrong. 0 1  I have been anxious or worried for no good reason. 3 2  I have felt scared or panicky for no good reason. 3 1  Things have been getting on top of me. 1 1  I have been so  unhappy that I have had difficulty sleeping. 0 1  I have felt  sad or miserable. 0 0  I have been so unhappy that I have been crying. 2 0  The thought of harming myself has occurred to me. 0 0  Edinburgh Postnatal Depression Scale Total 12 6

## 2021-04-22 ENCOUNTER — Encounter: Payer: Self-pay | Admitting: *Deleted

## 2021-04-22 ENCOUNTER — Other Ambulatory Visit: Payer: Self-pay

## 2021-04-22 ENCOUNTER — Telehealth: Payer: Self-pay | Admitting: Obstetrics & Gynecology

## 2021-04-22 ENCOUNTER — Ambulatory Visit (INDEPENDENT_AMBULATORY_CARE_PROVIDER_SITE_OTHER): Payer: Medicaid Other | Admitting: Obstetrics & Gynecology

## 2021-04-22 VITALS — BP 141/89 | HR 97 | Ht 68.0 in | Wt 334.0 lb

## 2021-04-22 DIAGNOSIS — I10 Essential (primary) hypertension: Secondary | ICD-10-CM

## 2021-04-22 DIAGNOSIS — F419 Anxiety disorder, unspecified: Secondary | ICD-10-CM | POA: Diagnosis not present

## 2021-04-22 DIAGNOSIS — G8918 Other acute postprocedural pain: Secondary | ICD-10-CM

## 2021-04-22 MED ORDER — TRIAMTERENE-HCTZ 37.5-25 MG PO CAPS: 1.0000 | ORAL_CAPSULE | Freq: Every day | ORAL | 0 refills | Status: DC

## 2021-04-22 MED ORDER — OXYCODONE HCL 5 MG PO TABS: 5.0000 mg | ORAL_TABLET | Freq: Four times a day (QID) | ORAL | 0 refills | Status: AC | PRN

## 2021-04-22 MED ORDER — SERTRALINE HCL 100 MG PO TABS: 100.0000 mg | ORAL_TABLET | Freq: Every day | ORAL | 6 refills | Status: DC

## 2021-04-22 MED ORDER — HYDROXYZINE HCL 10 MG PO TABS: 10.0000 mg | ORAL_TABLET | Freq: Three times a day (TID) | ORAL | 0 refills | Status: DC | PRN

## 2021-04-22 NOTE — Telephone Encounter (Signed)
Rx sent in to pharmacy. 

## 2021-04-22 NOTE — Progress Notes (Signed)
POSTPARTUM VISIT Patient name: Summer Terrell MRN [id number]  Date of birth: [birth year] Chief Complaint:   Blood Pressure Check (+ swelling; + anxiety)  History of Present Illness:   Summer Terrell is a 35 y.o. G55P2012 female being seen today for the following postpartum concern  -Anxiety- pt tearful in discussing.  Expressed frustration with lack of sleep and just feeling overwhelmed with everything.  Struggles to reach out for support, almost as though it's a pride thing and must do it on her own. Mom is available, but sometimes that can make it worse Notes h/o anxiety and wishes to reconsider medication at this time.  -HTN- pt currently taking Procardia XL 30mg  daily.  She has been compliant.  Denies headache or blurry vision.  Pt notes continued swelling- no change with Lasix.   Concerned that part of her elevated BP   She is 6 day postpartum following a primary cesarean section, low transverse incision  Pregnancy complicated by Gestational HTN .   The pregnancy intention screening data noted above was reviewed. Potential methods of contraception were discussed. The patient elected to proceed with No data recorded.  Edinburgh Postpartum Depression Screening: Positive      Review of Systems:   Pertinent items are noted in HPI Denies Abnormal vaginal discharge w/ itching/odor/irritation, headaches, visual changes, shortness of breath, chest pain, abdominal pain, severe nausea/vomiting, or problems with urination or bowel movements. Pertinent History Reviewed:  Reviewed past medical,surgical, obstetrical and family history.  Reviewed problem list, medications and allergies. OB History  Gravida Para Term Preterm AB Living  3 2 2   1 2   SAB IAB Ectopic Multiple Live Births  1     0 2    # Outcome Date GA Lbr Len/2nd Weight Sex Delivery Anes PTL Lv  3 Term 04/17/21 [redacted]w[redacted]d  7 lb 4.1 oz (3.29 kg) M CS-LTranv EPI  LIV  2 SAB 2016          1 Term 11/27/11 [redacted]w[redacted]d  5 lb 4 oz (2.381  kg) F CS-LTranv EPI N LIV     Complications: Fetal Intolerance   Physical Assessment:   Vitals:   04/22/21 1154  BP: (!) 141/89  Pulse: 97  Weight: (!) 334 lb (151.5 kg)  Height: 5\' 8"  (1.727 m)  Body mass index is 50.78 kg/m.       Physical Examination:   General appearance: anxious and crying  Mental status: alert, oriented to person, place, and time  Skin: warm & dry   Cardiovascular: normal heart rate noted   Respiratory: normal respiratory effort, no distress   Breasts: deferred, no complaints   Extremities: 2+ edema  Chaperone: N/A         No results found for this or any previous visit (from the past 24 hour(s)).  Assessment & Plan:  Postpartum anxiety  Start zoloft  Hydroxyzine as needed HTN  Continue procardia  Added Dyazide   Meds:  Meds ordered this encounter  Medications   triamterene-hydrochlorothiazide (DYAZIDE) 37.5-25 MG capsule    Sig: Take 1 each (1 capsule total) by mouth daily.    Dispense:  30 capsule    Refill:  0   sertraline (ZOLOFT) 100 MG tablet    Sig: Take 1 tablet (100 mg total) by mouth daily.    Dispense:  30 tablet    Refill:  6   hydrOXYzine (ATARAX/VISTARIL) 10 MG tablet    Sig: Take 1 tablet (10 mg total) by mouth 3 (three)  times daily as needed.    Dispense:  30 tablet    Refill:  0    Follow-up: 1wk next appt  No orders of the defined types were placed in this encounter.   Myna Hidalgo, DO Attending Obstetrician & Gynecologist, [facility] for [employer], [facility]

## 2021-04-22 NOTE — Telephone Encounter (Signed)
Patient is calling wanting to know if you would refill her pain meds if so to [facility]

## 2021-04-22 NOTE — Addendum Note (Signed)
Addended by: Sharon Seller on: 04/22/2021 05:00 PM   Modules accepted: Orders

## 2021-04-24 ENCOUNTER — Encounter (HOSPITAL_COMMUNITY): Payer: Self-pay

## 2021-04-24 ENCOUNTER — Emergency Department (HOSPITAL_COMMUNITY)
Admission: EM | Admit: 2021-04-24 | Discharge: 2021-04-24 | Disposition: A | Discharge status: Home / Self Care | Payer: Medicaid Other | Attending: Emergency Medicine | Admitting: Emergency Medicine

## 2021-04-24 DIAGNOSIS — J45909 Unspecified asthma, uncomplicated: Secondary | ICD-10-CM | POA: Diagnosis not present

## 2021-04-24 DIAGNOSIS — Z79899 Other long term (current) drug therapy: Secondary | ICD-10-CM | POA: Diagnosis not present

## 2021-04-24 DIAGNOSIS — Z7982 Long term (current) use of aspirin: Secondary | ICD-10-CM | POA: Diagnosis not present

## 2021-04-24 DIAGNOSIS — I10 Essential (primary) hypertension: Secondary | ICD-10-CM | POA: Insufficient documentation

## 2021-04-24 DIAGNOSIS — Z87891 Personal history of nicotine dependence: Secondary | ICD-10-CM | POA: Insufficient documentation
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
## 2021-04-24 NOTE — ED Notes (Signed)
Assessed wound vac dressing, no leaks noted, dressing clean, dry and intact. Pt also reports the feet have become more edematous since delivering son 7 days ago. Non-pitting at this time, but excess edema noted bilaterally to feet and lower legs. Pedal pulses strong, feet warm and dry.

## 2021-04-24 NOTE — ED Notes (Addendum)
Pt reports 7 days postpartum with elevated bp, called doctor and was told to come to ER. States she had a headache yesterday but could not get any relief despite taking tylenol, ibuprophen. Headache has resolved. States she was dx'ed with gestational hypertension and was placed on hydrochlorothiazide 25mg  on Friday and placed on nifedipine this past Monday once a day. Was also ordered 20mg  lasix for 4 days by primary dr.   Denies blurred vision or seeing spots. Has some tingling in hands but was previously told it was carpel tunnel. Sees

## 2021-04-24 NOTE — ED Triage Notes (Signed)
Pt. States they are 7 days post partum and they have elevated blood pressure. Pt. Also states they have a wound vac and it has been throwing an error message.

## 2021-04-24 NOTE — ED Notes (Signed)
Pt with wound vac in place on lower abdomen. Pt on phone with tech support because wound vac is beeping air leak. Upon assessment of abdomen, dressing on right lower abdomen appears to be coming lose. Dressing reinforced for patient with Tegaderm.

## 2021-04-24 NOTE — Discharge Instructions (Signed)
Continue taking your blood pressure medicines as prescribed.  Follow-up Tuesday as planned.  Only check your blood pressure if you start feeling bad, keep your legs elevated

## 2021-04-26 ENCOUNTER — Ambulatory Visit (INDEPENDENT_AMBULATORY_CARE_PROVIDER_SITE_OTHER): Payer: Medicaid Other | Admitting: Women's Health

## 2021-04-26 ENCOUNTER — Telehealth: Payer: Medicaid Other

## 2021-04-26 ENCOUNTER — Other Ambulatory Visit: Payer: Self-pay

## 2021-04-26 ENCOUNTER — Ambulatory Visit: Payer: Medicaid Other | Admitting: Clinical

## 2021-04-26 ENCOUNTER — Institutional Professional Consult (permissible substitution): Payer: Medicaid Other

## 2021-04-26 ENCOUNTER — Encounter: Payer: Self-pay | Admitting: Women's Health

## 2021-04-26 VITALS — BP 152/94 | HR 95 | Ht 68.0 in | Wt 308.0 lb

## 2021-04-26 DIAGNOSIS — F418 Other specified anxiety disorders: Secondary | ICD-10-CM

## 2021-04-26 DIAGNOSIS — Z98891 History of uterine scar from previous surgery: Secondary | ICD-10-CM

## 2021-04-26 DIAGNOSIS — F4323 Adjustment disorder with mixed anxiety and depressed mood: Secondary | ICD-10-CM

## 2021-04-26 DIAGNOSIS — O165 Unspecified maternal hypertension, complicating the puerperium: Secondary | ICD-10-CM

## 2021-04-26 MED ORDER — BUSPIRONE HCL 5 MG PO TABS: 5.0000 mg | ORAL_TABLET | Freq: Two times a day (BID) | ORAL | 2 refills | Status: DC

## 2021-04-26 MED ORDER — NIFEDIPINE ER 60 MG PO TB24: 60.0000 mg | ORAL_TABLET | Freq: Every day | ORAL | 2 refills | Status: DC

## 2021-04-26 NOTE — Patient Instructions (Addendum)
[facility] [address] [place], [place] [address] [phone] (main office) [phone] (Linnette Panella's office)   

## 2021-04-26 NOTE — Progress Notes (Signed)
GYN VISIT Patient name: Summer Terrell MRN [id number]  Date of birth: 06-16-1986 Chief Complaint:   Routine Post Op  History of Present Illness:   Summer Terrell is a 35 y.o. G70P2012 Caucasian female 9d s/p RCS after failed IOL/TOLAC for GHTN>mild pre-e, being seen today for bp/incision check. D/C'd from hospital on nifedipine 30mg , dyazide 37.5/25mg  added 10/28, took both @ 0900. Had migraine yesterday (has h/o same), no headache at all today. Denies visual changes, ruq/epigastric pain, n/v.  Bottlefeeding.  Feels bp is more r/t her anxiety. On zoloft 100mg  (just started 10/28), rx'd vistaril which only makes here sleepy- doesn't help w/ anxiety. Denies SI/HI/II.  No LMP recorded.  Depression screen [facility] 2/9 02/04/2021 11/04/2020 09/24/2020  Decreased Interest 0 0 1  Down, Depressed, Hopeless 1 1 1   PHQ - 2 Score 1 1 2   Altered sleeping 1 0 1  Tired, decreased energy 1 2 2   Change in appetite 0 0 1  Feeling bad or failure about yourself  0 0 0  Trouble concentrating 0 0 1  Moving slowly or fidgety/restless 0 0 0  Suicidal thoughts 0 0 0  PHQ-9 Score 3 3 7      GAD 7 : Generalized Anxiety Score 02/04/2021 11/04/2020 09/24/2020  Nervous, Anxious, on Edge 1 1 1   Control/stop worrying 1 1 1   Worry too much - different things 1 1 1   Trouble relaxing 1 1 1   Restless 0 0 0  Easily annoyed or irritable 1 1 1   Afraid - awful might happen 0 0 0  Total GAD 7 Score 5 5 5      Review of Systems:   Pertinent items are noted in HPI Denies fever/chills, dizziness, headaches, visual disturbances, fatigue, shortness of breath, chest pain, abdominal pain, vomiting, abnormal vaginal discharge/itching/odor/irritation, problems with periods, bowel movements, urination, or intercourse unless otherwise stated above.  Pertinent History Reviewed:  Reviewed past medical,surgical, social, obstetrical and family history.  Reviewed problem list, medications and allergies. Physical Assessment:   Vitals:    04/26/21 1021 04/26/21 1027  BP: (!) 169/100 (!) 152/94  Pulse: 97 95  Weight: (!) 308 lb (139.7 kg)   Height: 5\' 8"  (1.727 m)   Body mass index is 46.83 kg/m.       Physical Examination:   General appearance: alert, well appearing, and in no distress  Mental status: alert, oriented to person, place, and time  Skin: warm & dry   Cardiovascular: normal heart rate noted  Respiratory: normal respiratory effort, no distress  Abdomen: soft, non-tender, Prevena wound vac removed, c/s incision well approximated, no erythema/induration/drainage  Pelvic: examination not indicated  Extremities: no edema   Chaperone: N/A    No results found for this or any previous visit (from the past 24 hour(s)).  Assessment & Plan:  1) 9d s/p RCS after failed IOL/TOLAC for GHTN>mild pre-e> bottlefeeding  2) PPHTN> increase nifedipine to 60mg  daily, continue dyazide 37.5/25mg  daily,  f/u 2d for bp check (take meds at least 1hr before visit). Reviewed pre-e s/s, reasons to seek care. Check bp BID, let know/go to [facility] if sx or severe range bp's  3) Anxiety> continue zoloft 100mg , add buspar   Meds:  Meds ordered this encounter  Medications   NIFEdipine (ADALAT CC) 60 MG 24 hr tablet    Sig: Take 1 tablet (60 mg total) by mouth daily.    Dispense:  30 tablet    Refill:  2    Order Specific Question:  Supervising Provider    Answer:   Duane Lope H [[id number]]   busPIRone (BUSPAR) 5 MG tablet    Sig: Take 1 tablet (5 mg total) by mouth 2 (two) times daily.    Dispense:  60 tablet    Refill:  2    Order Specific Question:   Supervising Provider    Answer:   Despina Hidden, LUTHER H [[id number]]    No orders of the defined types were placed in this encounter.   Return for Thurs bp check nurse.  Cheral Marker CNM, [facility] 04/26/2021 11:38 AM

## 2021-04-26 NOTE — Patient Instructions (Addendum)
Increase nifedipine to 60mg    Call the office [phone]) or go to [facility] hospital for these signs of pre-eclampsia: Severe headache that does not go away with Tylenol Visual changes- seeing spots, double, blurred vision Pain under your right breast or upper abdomen that does not go away with Tums or heartburn medicine Nausea and/or vomiting Severe swelling in your hands, feet, and face

## 2021-04-27 ENCOUNTER — Telehealth: Payer: Self-pay | Admitting: Women's Health

## 2021-04-27 ENCOUNTER — Telehealth (HOSPITAL_COMMUNITY): Payer: Self-pay | Admitting: *Deleted

## 2021-04-27 NOTE — Telephone Encounter (Signed)
Patient doesn't think blood pressure medicine is agreeing with her. She's having the face flushing, heart racing, headache within an hour of taking it.  She stated she thought it was her anxiety causing her all those issues, but it got increased yesterday and it's still happening. She looked up the side effects for the medicine and the symptoms she's having matches the side effects for the medicine.

## 2021-04-27 NOTE — Telephone Encounter (Signed)
I spoke with Elmyra Ricks, CNM. Selena Batten says since pt has been on BP med, even though it was increased, she don't think these symptoms are coming from BP med but coming from anxiety. Pt was advised hopefully when the Buspar gets in her system, she will notice a difference. Pt voiced understanding. JSY

## 2021-04-28 ENCOUNTER — Ambulatory Visit (INDEPENDENT_AMBULATORY_CARE_PROVIDER_SITE_OTHER): Payer: Medicaid Other | Admitting: *Deleted

## 2021-04-28 ENCOUNTER — Other Ambulatory Visit: Payer: Medicaid Other

## 2021-04-28 ENCOUNTER — Other Ambulatory Visit: Payer: Self-pay

## 2021-04-28 ENCOUNTER — Encounter: Payer: Medicaid Other | Admitting: Advanced Practice Midwife

## 2021-04-28 ENCOUNTER — Encounter: Payer: Self-pay | Admitting: *Deleted

## 2021-04-28 VITALS — BP 135/96 | HR 82 | Ht 68.0 in | Wt 303.0 lb

## 2021-04-28 DIAGNOSIS — Z0130 Encounter for examination of blood pressure without abnormal findings: Secondary | ICD-10-CM

## 2021-04-28 NOTE — Progress Notes (Addendum)
   NURSE VISIT- BLOOD PRESSURE CHECK  SUBJECTIVE:  Summer Terrell is a 35 y.o. G56P2012 female here for BP check. She is postpartum, delivery date 04/17/21     HYPERTENSION ROS:  Pregnant/postpartum:  Severe headaches that don't go away with tylenol/other medicines: No  Visual changes (seeing spots/double/blurred vision) No  Severe pain under right breast breast or in center of upper chest No  Severe nausea/vomiting No  Taking medicines as instructed yes   OBJECTIVE:  BP (!) 135/96 (BP Location: Right Arm, Patient Position: Sitting, Cuff Size: Large)   Pulse 82   Ht 5\' 8"  (1.727 m)   Wt (!) 303 lb (137.4 kg)   Breastfeeding No   BMI 46.07 kg/m 1st BP reading was 146/91 pulse 86 Appearance alert, well appearing, and in no distress.  ASSESSMENT: Postpartum  blood pressure check  PLAN: Discussed with Dr.Eure.  Recommendations: no changes needed   Follow-up: as scheduled    04/28/2021 2:55 PM  Attestation of Attending Supervision of Nursing Visit Encounter: Evaluation and management procedures were performed by the nursing staff under my supervision and collaboration.  I have reviewed the nurse's note and chart, and I agree with the management and plan.  13/08/2020 MD Attending Physician for the [facility] 05/06/2021 10:10 AM

## 2021-05-02 ENCOUNTER — Telehealth: Payer: Self-pay | Admitting: Women's Health

## 2021-05-02 NOTE — Telephone Encounter (Signed)
Ok not perfect but decent, I am glad headaches are better, stay on the hctz med

## 2021-05-02 NOTE — BH Specialist Note (Signed)
Pt prefers to switch this visit to a later time this afternoon (3:45pm), but did not arrive to video visit at later time, and did not answer the phone; unable to leave a voicemail as mailbox is full; MyChart message sent to pt.

## 2021-05-02 NOTE — Telephone Encounter (Signed)
Patient calling stating that has been on blood pressure meds and she states that she stopped taking it Friday and her headaches and all the other systems that she was having is gone she is wanting the blood pressure meds changed to something else ; patient states that she has not checked it but states that she has no signs of high blood pressure either

## 2021-05-05 ENCOUNTER — Other Ambulatory Visit: Payer: Medicaid Other

## 2021-05-05 ENCOUNTER — Encounter: Payer: Medicaid Other | Admitting: Obstetrics & Gynecology

## 2021-05-10 ENCOUNTER — Ambulatory Visit: Payer: Medicaid Other | Admitting: Clinical

## 2021-05-10 DIAGNOSIS — Z91199 Patient's noncompliance with other medical treatment and regimen due to unspecified reason: Secondary | ICD-10-CM

## 2021-05-16 ENCOUNTER — Other Ambulatory Visit: Payer: Self-pay | Admitting: *Deleted

## 2021-05-16 DIAGNOSIS — F419 Anxiety disorder, unspecified: Secondary | ICD-10-CM

## 2021-05-17 MED ORDER — SERTRALINE HCL 100 MG PO TABS: 100.0000 mg | ORAL_TABLET | Freq: Every day | ORAL | 3 refills | Status: DC

## 2021-05-18 ENCOUNTER — Other Ambulatory Visit: Payer: Self-pay | Admitting: Women's Health

## 2021-05-22 ENCOUNTER — Other Ambulatory Visit: Payer: Self-pay | Admitting: Women's Health

## 2021-05-24 ENCOUNTER — Encounter: Payer: Self-pay | Admitting: Women's Health

## 2021-05-24 ENCOUNTER — Ambulatory Visit (INDEPENDENT_AMBULATORY_CARE_PROVIDER_SITE_OTHER): Payer: Medicaid Other | Admitting: Women's Health

## 2021-05-24 ENCOUNTER — Other Ambulatory Visit: Payer: Self-pay

## 2021-05-24 DIAGNOSIS — Z8679 Personal history of other diseases of the circulatory system: Secondary | ICD-10-CM | POA: Diagnosis not present

## 2021-05-24 DIAGNOSIS — Z8759 Personal history of other complications of pregnancy, childbirth and the puerperium: Secondary | ICD-10-CM | POA: Diagnosis not present

## 2021-05-24 NOTE — Patient Instructions (Signed)
Preparation H & TUCKS pads for hemorrhoids  Let us know if bp >140/90  Constipation Drink plenty of fluid, preferably water, throughout the day Eat foods high in fiber such as fruits, vegetables, and grains Exercise, such as walking, is a good way to keep your bowels regular Drink warm fluids, especially warm prune juice, or decaf coffee Eat a 1/2 cup of real oatmeal (not instant), 1/2 cup applesauce, and 1/2-1 cup warm prune juice every day If needed, you may take Colace (docusate sodium) stool softener once or twice a day to help keep the stool soft. If you are pregnant, wait until you are out of your first trimester (12-14 weeks of pregnancy) If you still are having problems with constipation, you may take Miralax once daily as needed to help keep your bowels regular.  If you are pregnant, wait until you are out of your first trimester (12-14 weeks of pregnancy)

## 2021-05-24 NOTE — Progress Notes (Signed)
 POSTPARTUM VISIT Patient name: Summer Terrell MRN [id number]  Date of birth: 09/25/1985 Chief Complaint:   Postpartum Care  History of Present Illness:   Summer Terrell is a 35 y.o. G3P2012 Caucasian female being seen today for a postpartum visit. She is 5 weeks postpartum following a repeat cesarean section, low transverse incision at 37.1 gestational weeks. IOL: yes, for gestational hypertension>mild pre-e . Anesthesia: epidural.  Laceration: n/a.  Complications: none. Inpatient contraception: no.   Pregnancy complicated by GHTN . Tobacco use: former . Substance use disorder: no. Last pap smear: 08/02/20 and results were negative per pt report at [facility] . Next pap smear due: 2025 No LMP recorded.  Postpartum course has been complicated by ppHTN, d/c'd on nifedipine 30mg, dyazide 37.5/25mg added 10/28. 11/1 nifedipine was increased to 60mg daily . Stopped both few days ago. Bleeding  period started yesterday . Bowel function is  constipation and hemorrhiods . Bladder function is normal. Urinary incontinence? no, fecal incontinence? no Patient is not sexually active. Last sexual activity: prior to birth of baby. Desired contraception: Condoms. Patient does not want a pregnancy in the future.  Desired family size is 2 children.   Upstream - 05/24/21 1001       Pregnancy Intention Screening   Does the patient want to become pregnant in the next year? No    Does the patient's partner want to become pregnant in the next year? No    Would the patient like to discuss contraceptive options today? No      Contraception Wrap Up   Current Method Abstinence    End Method Female Condom    Contraception Counseling Provided No            The pregnancy intention screening data noted above was reviewed. Potential methods of contraception were discussed. The patient elected to proceed with Female Condom.  Edinburgh Postpartum Depression Screening: negative/equivocal, doing well on zoloft & buspar   Edinburgh Postnatal Depression Scale - 05/24/21 1001       Edinburgh Postnatal Depression Scale:  In the Past 7 Days   I have been able to laugh and see the funny side of things. 1    I have looked forward with enjoyment to things. 0    I have blamed myself unnecessarily when things went wrong. 1    I have been anxious or worried for no good reason. 3    I have felt scared or panicky for no good reason. 2    Things have been getting on top of me. 2    I have been so unhappy that I have had difficulty sleeping. 0    I have felt sad or miserable. 0    I have been so unhappy that I have been crying. 1    The thought of harming myself has occurred to me. 0    Edinburgh Postnatal Depression Scale Total 10             GAD 7 : Generalized Anxiety Score 02/04/2021 11/04/2020 09/24/2020  Nervous, Anxious, on Edge 1 1 1  Control/stop worrying 1 1 1  Worry too much - different things 1 1 1  Trouble relaxing 1 1 1  Restless 0 0 0  Easily annoyed or irritable 1 1 1  Afraid - awful might happen 0 0 0  Total GAD 7 Score 5 5 5     Baby's course has been uncomplicated. Baby is feeding by bottle. Infant has a pediatrician/family doctor?   Yes.  Childcare strategy if returning to work/school: family.  Pt has material needs met for her and baby: Yes.   Review of Systems:   Pertinent items are noted in HPI Denies Abnormal vaginal discharge w/ itching/odor/irritation, headaches, visual changes, shortness of breath, chest pain, abdominal pain, severe nausea/vomiting, or problems with urination or bowel movements. Pertinent History Reviewed:  Reviewed past medical,surgical, obstetrical and family history.  Reviewed problem list, medications and allergies. OB History  Gravida Para Term Preterm AB Living  _0 SAB IAB Ectopic Multiple Live Births  1     0 2    # Outcome Date GA Lbr Len/2nd Weight Sex Delivery Anes PTL Lv  3 Term 04/17/21 [redacted]w[redacted]d 7 lb 4.1 oz (3.29 kg) M CS-LTranv EPI  LIV  2  SAB 2016          1 Term 11/27/11 436w0d5 lb 4 oz (2.381 kg) F CS-LTranv EPI N LIV     Complications: Fetal Intolerance   Physical Assessment:   Vitals:   05/24/21 0955  BP: 123/87  Pulse: 73  Weight: (!) 309 lb (140.2 kg)  Height: 5' 8" (1.727 m)  Body mass index is 46.98 kg/m.       Physical Examination:   General appearance: alert, well appearing, and in no distress  Mental status: alert, oriented to person, place, and time  Skin: warm & dry   Cardiovascular: normal heart rate noted   Respiratory: normal respiratory effort, no distress   Breasts: deferred, no complaints   Abdomen: soft, non-tender, c/s incision healing well, some moist erythema c/w yeast, 3 spots of visible suture/open areas, suture clipped/removed at these areas, painted w/ gentian violet  Pelvic: examination not indicated. Thin prep pap obtained: No  Rectal: not examined  Extremities: Edema: none   Chaperone: N/A         No results found for this or any previous visit (from the past 24 hour(s)).  Assessment & Plan:  1) Postpartum exam 2) 5 wks s/p repeat cesarean section, low transverse incision 3) bottle feeding 4) Depression screening 5) Contraception plans condoms, let [place] if wants something else 6) Resolved PPHTN 7) Dep/anx> doing well on zoloft 10063mnd buspar 8) Constipation/hemorrhoids> gave printed prevention/relief measures   Essential components of care per ACOG recommendations:  1.  Mood and well being:  If positive depression screen, discussed and plan developed.  If using tobacco we discussed reduction/cessation and risk of relapse If current substance abuse, we discussed and referral to local resources was offered.   2. Infant care and feeding:  If breastfeeding, discussed returning to work, pumping, breastfeeding-associated pain, guidance regarding return to fertility while lactating if not using another method. If needed, patient was provided with a letter to be allowed to  pump q 2-3hrs to support lactation in a private location with access to a refrigerator to store breastmilk.   Recommended that all caregivers be immunized for flu, pertussis and other preventable communicable diseases If pt does not have material needs met for her/baby, referred to local resources for help obtaining these.  3. Sexuality, contraception and birth spacing Provided guidance regarding sexuality, management of dyspareunia, and resumption of intercourse Discussed avoiding interpregnancy interval <[id number] recommended birth spacing of 18 months  4. Sleep and fatigue Discussed coping options for fatigue and sleep disruption Encouraged family/partner/community support of 4 hrs of uninterrupted sleep to help with mood and fatigue  5. Physical recovery  If pt had a C/S, assessed incisional pain and providing guidance on normal vs prolonged recovery If pt had a laceration, perineal healing and pain reviewed.  If urinary or fecal incontinence, discussed management and referred to PT or uro/gyn if indicated  Patient is safe to resume physical activity. Discussed attainment of healthy weight.  6.  Chronic disease management Discussed pregnancy complications if any, and their implications for future childbearing and long-term maternal health. Review recommendations for prevention of recurrent pregnancy complications, such as 17 hydroxyprogesterone caproate to reduce risk for recurrent PTB not applicable, or aspirin to reduce risk of preeclampsia yes. Pt had GDM: no. If yes, 2hr GTT scheduled: not applicable. Reviewed medications and non-pregnant dosing including consideration of whether pt is breastfeeding using a reliable resource such as LactMed: not applicable Referred for f/u w/ PCP or subspecialist providers as indicated: not applicable  7. Health maintenance Mammogram at 34yo or earlier if indicated Pap smears as indicated  Meds: No orders of the defined types were placed in this  encounter.   Follow-up: Return in about 1 year (around 05/24/2022) for Physical.   No orders of the defined types were placed in this encounter.   Kimberly R Booker CNM, WHNP-BC 05/24/2021 10:32 AM   

## 2021-05-26 ENCOUNTER — Other Ambulatory Visit: Payer: Self-pay

## 2021-05-26 DIAGNOSIS — I10 Essential (primary) hypertension: Secondary | ICD-10-CM

## 2021-05-26 NOTE — Telephone Encounter (Signed)
Called patient to review.  Ok to stop BP medication.  Pt to take BP at home every few days for the next week or so.  If above 140/90- pt to schedule follow up either with [place] or PCP.  Questions and concerns were addressed.  Myna Hidalgo, DO Attending Obstetrician & Gynecologist, [facility] for [employer], [facility]

## 2021-09-23 ENCOUNTER — Other Ambulatory Visit (HOSPITAL_COMMUNITY): Payer: Self-pay

## 2022-01-21 ENCOUNTER — Encounter (HOSPITAL_COMMUNITY): Payer: Self-pay | Admitting: Emergency Medicine

## 2022-01-21 ENCOUNTER — Inpatient Hospital Stay (HOSPITAL_COMMUNITY)
Admission: EM | Admit: 2022-01-21 | Discharge: 2022-01-23 | DRG: 920 | Disposition: A | Discharge status: Home / Self Care | Payer: Medicaid Other | Attending: Family Medicine | Admitting: Family Medicine

## 2022-01-21 ENCOUNTER — Emergency Department (HOSPITAL_COMMUNITY): Payer: Medicaid Other

## 2022-01-21 DIAGNOSIS — R1310 Dysphagia, unspecified: Secondary | ICD-10-CM | POA: Diagnosis present

## 2022-01-21 DIAGNOSIS — L03211 Cellulitis of face: Secondary | ICD-10-CM | POA: Diagnosis present

## 2022-01-21 DIAGNOSIS — K1120 Sialoadenitis, unspecified: Secondary | ICD-10-CM | POA: Diagnosis present

## 2022-01-21 DIAGNOSIS — J029 Acute pharyngitis, unspecified: Secondary | ICD-10-CM | POA: Diagnosis present

## 2022-01-21 DIAGNOSIS — F419 Anxiety disorder, unspecified: Secondary | ICD-10-CM | POA: Diagnosis present

## 2022-01-21 DIAGNOSIS — E876 Hypokalemia: Secondary | ICD-10-CM | POA: Diagnosis present

## 2022-01-21 DIAGNOSIS — Z833 Family history of diabetes mellitus: Secondary | ICD-10-CM

## 2022-01-21 DIAGNOSIS — F319 Bipolar disorder, unspecified: Secondary | ICD-10-CM | POA: Diagnosis present

## 2022-01-21 DIAGNOSIS — Z6841 Body Mass Index (BMI) 40.0 and over, adult: Secondary | ICD-10-CM

## 2022-01-21 DIAGNOSIS — Z79899 Other long term (current) drug therapy: Secondary | ICD-10-CM

## 2022-01-21 DIAGNOSIS — K047 Periapical abscess without sinus: Secondary | ICD-10-CM | POA: Diagnosis present

## 2022-01-21 DIAGNOSIS — Y838 Other surgical procedures as the cause of abnormal reaction of the patient, or of later complication, without mention of misadventure at the time of the procedure: Secondary | ICD-10-CM | POA: Diagnosis present

## 2022-01-21 DIAGNOSIS — Z881 Allergy status to other antibiotic agents status: Secondary | ICD-10-CM

## 2022-01-21 DIAGNOSIS — Z8049 Family history of malignant neoplasm of other genital organs: Secondary | ICD-10-CM

## 2022-01-21 DIAGNOSIS — G4700 Insomnia, unspecified: Secondary | ICD-10-CM | POA: Diagnosis present

## 2022-01-21 DIAGNOSIS — T8189XA Other complications of procedures, not elsewhere classified, initial encounter: Principal | ICD-10-CM | POA: Diagnosis present

## 2022-01-21 DIAGNOSIS — Z8249 Family history of ischemic heart disease and other diseases of the circulatory system: Secondary | ICD-10-CM

## 2022-01-21 DIAGNOSIS — F418 Other specified anxiety disorders: Secondary | ICD-10-CM | POA: Diagnosis present

## 2022-01-21 DIAGNOSIS — Z87891 Personal history of nicotine dependence: Secondary | ICD-10-CM

## 2022-01-21 DIAGNOSIS — Z800 Family history of malignant neoplasm of digestive organs: Secondary | ICD-10-CM

## 2022-01-21 LAB — CBC WITH DIFFERENTIAL/PLATELET
Abs Immature Granulocytes: 0.04 10*3/uL (ref 0.00–0.07)
Basophils Absolute: 0 10*3/uL (ref 0.0–0.1)
Basophils Relative: 0 %
Eosinophils Absolute: 0.2 10*3/uL (ref 0.0–0.5)
Eosinophils Relative: 2 %
HCT: 35.8 % — ABNORMAL LOW (ref 36.0–46.0)
Hemoglobin: 11.2 g/dL — ABNORMAL LOW (ref 12.0–15.0)
Immature Granulocytes: 0 %
Lymphocytes Relative: 16 %
Lymphs Abs: 1.7 10*3/uL (ref 0.7–4.0)
MCH: 24.1 pg — ABNORMAL LOW (ref 26.0–34.0)
MCHC: 31.3 g/dL (ref 30.0–36.0)
MCV: 77.2 fL — ABNORMAL LOW (ref 80.0–100.0)
Monocytes Absolute: 0.7 10*3/uL (ref 0.1–1.0)
Monocytes Relative: 6 %
Neutro Abs: 8.1 10*3/uL — ABNORMAL HIGH (ref 1.7–7.7)
Neutrophils Relative %: 76 %
Platelets: 276 10*3/uL (ref 150–400)
RBC: 4.64 MIL/uL (ref 3.87–5.11)
RDW: 15.9 % — ABNORMAL HIGH (ref 11.5–15.5)
WBC: 10.8 10*3/uL — ABNORMAL HIGH (ref 4.0–10.5)
nRBC: 0 % (ref 0.0–0.2)

## 2022-01-21 LAB — BASIC METABOLIC PANEL
Anion gap: 6 (ref 5–15)
BUN: 10 mg/dL (ref 6–20)
CO2: 28 mmol/L (ref 22–32)
Calcium: 8.7 mg/dL — ABNORMAL LOW (ref 8.9–10.3)
Chloride: 104 mmol/L (ref 98–111)
Creatinine, Ser: 0.61 mg/dL (ref 0.44–1.00)
GFR, Estimated: >60 mL/min (ref >60)
Glucose, Bld: 110 mg/dL — ABNORMAL HIGH (ref 70–99)
Potassium: 3.4 mmol/L — ABNORMAL LOW (ref 3.5–5.1)
Sodium: 138 mmol/L (ref 135–145)

## 2022-01-21 LAB — LACTIC ACID, PLASMA
Lactic Acid, Venous: 0.5 mmol/L (ref 0.5–1.9)
Lactic Acid, Venous: 0.6 mmol/L (ref 0.5–1.9)

## 2022-01-21 LAB — HCG, SERUM, QUALITATIVE: Preg, Serum: NEGATIVE

## 2022-01-21 MED ORDER — LEVOFLOXACIN IN D5W 750 MG/150ML IV SOLN
750.0000 mg | INTRAVENOUS | Status: DC
  Administered 2022-01-21: 750 mg via INTRAVENOUS
  Filled 2022-01-21: qty 150

## 2022-01-21 MED ORDER — IBUPROFEN 600 MG PO TABS
600.0000 mg | ORAL_TABLET | Freq: Four times a day (QID) | ORAL | Status: DC
  Administered 2022-01-21 – 2022-01-22 (×5): 600 mg via ORAL
  Filled 2022-01-21 (×5): qty 1

## 2022-01-21 MED ORDER — SODIUM CHLORIDE 0.9% FLUSH: 3.0000 mL | INTRAVENOUS | Status: DC | PRN

## 2022-01-21 MED ORDER — METRONIDAZOLE 500 MG/100ML IV SOLN
500.0000 mg | Freq: Three times a day (TID) | INTRAVENOUS | Status: DC
Start: 2022-01-21 — End: 2022-01-23
  Administered 2022-01-21 – 2022-01-23 (×6): 500 mg via INTRAVENOUS
  Filled 2022-01-21 (×6): qty 100

## 2022-01-21 MED ORDER — FENTANYL CITRATE PF 50 MCG/ML IJ SOSY
25.0000 ug | PREFILLED_SYRINGE | INTRAMUSCULAR | Status: DC | PRN
  Administered 2022-01-21: 25 ug via INTRAVENOUS
  Filled 2022-01-21: qty 1

## 2022-01-21 MED ORDER — OXYCODONE HCL 5 MG PO TABS: 5.0000 mg | ORAL_TABLET | ORAL | Status: DC | PRN

## 2022-01-21 MED ORDER — SODIUM CHLORIDE 0.9 % IV BOLUS
1000.0000 mL | Freq: Once | INTRAVENOUS | Status: AC
  Administered 2022-01-21: 1000 mL via INTRAVENOUS

## 2022-01-21 MED ORDER — SENNOSIDES-DOCUSATE SODIUM 8.6-50 MG PO TABS
2.0000 | ORAL_TABLET | Freq: Every day | ORAL | Status: DC
  Filled 2022-01-21 (×2): qty 2

## 2022-01-21 MED ORDER — POLYETHYLENE GLYCOL 3350 17 G PO PACK: 17.0000 g | PACK | Freq: Every day | ORAL | Status: DC | PRN

## 2022-01-21 MED ORDER — ONDANSETRON HCL 4 MG/2ML IJ SOLN: 4.0000 mg | Freq: Four times a day (QID) | INTRAMUSCULAR | Status: DC | PRN

## 2022-01-21 MED ORDER — SERTRALINE HCL 50 MG PO TABS
100.0000 mg | ORAL_TABLET | Freq: Every day | ORAL | Status: DC
  Filled 2022-01-21 (×2): qty 2

## 2022-01-21 MED ORDER — METHYLPREDNISOLONE SODIUM SUCC 40 MG IJ SOLR
40.0000 mg | Freq: Two times a day (BID) | INTRAMUSCULAR | Status: DC
  Administered 2022-01-21 – 2022-01-23 (×5): 40 mg via INTRAVENOUS
  Filled 2022-01-21 (×5): qty 1

## 2022-01-21 MED ORDER — BUSPIRONE HCL 5 MG PO TABS
10.0000 mg | ORAL_TABLET | Freq: Three times a day (TID) | ORAL | Status: DC
  Filled 2022-01-21 (×3): qty 2

## 2022-01-21 MED ORDER — IOHEXOL 300 MG/ML  SOLN
75.0000 mL | Freq: Once | INTRAMUSCULAR | Status: AC | PRN
  Administered 2022-01-21: 75 mL via INTRAVENOUS

## 2022-01-21 MED ORDER — SODIUM CHLORIDE 0.9% FLUSH
3.0000 mL | Freq: Two times a day (BID) | INTRAVENOUS | Status: DC
Start: 2022-01-21 — End: 2022-01-23
  Administered 2022-01-21 – 2022-01-23 (×5): 3 mL via INTRAVENOUS

## 2022-01-21 MED ORDER — DEXAMETHASONE SODIUM PHOSPHATE 10 MG/ML IJ SOLN
10.0000 mg | Freq: Once | INTRAMUSCULAR | Status: AC
  Administered 2022-01-21: 10 mg via INTRAVENOUS
  Filled 2022-01-21: qty 1

## 2022-01-21 MED ORDER — MORPHINE SULFATE (PF) 4 MG/ML IV SOLN
4.0000 mg | Freq: Once | INTRAVENOUS | Status: AC
  Administered 2022-01-21: 4 mg via INTRAVENOUS
  Filled 2022-01-21: qty 1

## 2022-01-21 MED ORDER — HYDROXYZINE HCL 25 MG PO TABS
25.0000 mg | ORAL_TABLET | Freq: Three times a day (TID) | ORAL | Status: DC | PRN
  Administered 2022-01-21 – 2022-01-22 (×2): 25 mg via ORAL
  Filled 2022-01-21 (×3): qty 1

## 2022-01-21 MED ORDER — HEPARIN SODIUM (PORCINE) 5000 UNIT/ML IJ SOLN
5000.0000 [IU] | Freq: Three times a day (TID) | INTRAMUSCULAR | Status: DC
  Administered 2022-01-21 – 2022-01-23 (×7): 5000 [IU] via SUBCUTANEOUS
  Filled 2022-01-21 (×7): qty 1

## 2022-01-21 MED ORDER — HYDRALAZINE HCL 20 MG/ML IJ SOLN: 10.0000 mg | Freq: Four times a day (QID) | INTRAMUSCULAR | Status: DC | PRN

## 2022-01-21 MED ORDER — ALBUTEROL SULFATE (2.5 MG/3ML) 0.083% IN NEBU: 2.5000 mg | INHALATION_SOLUTION | RESPIRATORY_TRACT | Status: DC | PRN

## 2022-01-21 MED ORDER — METRONIDAZOLE 500 MG/100ML IV SOLN
500.0000 mg | Freq: Two times a day (BID) | INTRAVENOUS | Status: DC
  Administered 2022-01-21: 500 mg via INTRAVENOUS
  Filled 2022-01-21: qty 100

## 2022-01-21 MED ORDER — SODIUM CHLORIDE 0.9% FLUSH
3.0000 mL | Freq: Two times a day (BID) | INTRAVENOUS | Status: DC
Start: 2022-01-21 — End: 2022-01-23
  Administered 2022-01-21 – 2022-01-22 (×2): 3 mL via INTRAVENOUS

## 2022-01-21 MED ORDER — SODIUM CHLORIDE 0.9 % IV SOLN: INTRAVENOUS | Status: DC | PRN

## 2022-01-21 MED ORDER — POTASSIUM CHLORIDE CRYS ER 20 MEQ PO TBCR
40.0000 meq | EXTENDED_RELEASE_TABLET | ORAL | Status: AC
  Administered 2022-01-21 (×2): 40 meq via ORAL
  Filled 2022-01-21 (×2): qty 2

## 2022-01-21 MED ORDER — LEVOFLOXACIN IN D5W 750 MG/150ML IV SOLN
750.0000 mg | INTRAVENOUS | Status: DC
Start: 2022-01-22 — End: 2022-01-23
  Administered 2022-01-22 – 2022-01-23 (×2): 750 mg via INTRAVENOUS
  Filled 2022-01-21 (×2): qty 150

## 2022-01-21 MED ORDER — ACETAMINOPHEN 325 MG PO TABS: 650.0000 mg | ORAL_TABLET | Freq: Four times a day (QID) | ORAL | Status: DC | PRN

## 2022-01-21 MED ORDER — CLINDAMYCIN PHOSPHATE 900 MG/50ML IV SOLN
900.0000 mg | Freq: Three times a day (TID) | INTRAVENOUS | Status: DC
  Filled 2022-01-21 (×4): qty 50

## 2022-01-21 MED ORDER — ACETAMINOPHEN 650 MG RE SUPP: 650.0000 mg | Freq: Four times a day (QID) | RECTAL | Status: DC | PRN

## 2022-01-21 MED ORDER — TRAZODONE HCL 50 MG PO TABS
50.0000 mg | ORAL_TABLET | Freq: Every evening | ORAL | Status: DC | PRN
  Administered 2022-01-22: 50 mg via ORAL
  Filled 2022-01-21: qty 1

## 2022-01-21 MED ORDER — BISACODYL 10 MG RE SUPP: 10.0000 mg | Freq: Every day | RECTAL | Status: DC | PRN

## 2022-01-21 MED ORDER — ONDANSETRON HCL 4 MG PO TABS: 4.0000 mg | ORAL_TABLET | Freq: Four times a day (QID) | ORAL | Status: DC | PRN

## 2022-01-21 MED ORDER — FERROUS SULFATE 325 (65 FE) MG PO TABS
325.0000 mg | ORAL_TABLET | ORAL | Status: DC
  Administered 2022-01-23: 325 mg via ORAL
  Filled 2022-01-21 (×3): qty 1

## 2022-01-21 MED ORDER — SODIUM CHLORIDE 0.9 % IV SOLN: INTRAVENOUS | Status: DC

## 2022-01-21 NOTE — ED Triage Notes (Signed)
Pt c/o extensive swelling to lower jam and face this morning. Pt states she had dental surgery to remove right lower wisdom tooth.

## 2022-01-21 NOTE — ED Notes (Signed)
Pt returned from CT Scan and ambulated to restroom.

## 2022-01-21 NOTE — ED Provider Notes (Signed)
[facility] EMERGENCY DEPARTMENT Provider Note   CSN: [id number] Arrival date & time: 01/21/22  0446     History  Chief Complaint  Patient presents with   Post-op Problem    Summer Terrell is a 36 y.o. female.  Patient is a 36 year old female with history of depression.  Patient presenting today with complaints of neck pain and swelling.  This started 2 days ago and is rapidly worsening.  She reports an extraction of her right lower wisdom tooth performed 3 days ago.  She denies to me she is having any fevers or chills.  She does describe some discomfort with swallowing.  There are no alleviating factors.  Patient was not prescribed an antibiotic following the extraction.  The history is provided by the patient.       Home Medications Prior to Admission medications   Medication Sig Start Date End Date Taking? Authorizing Provider  acetaminophen (TYLENOL) 500 MG tablet Take 1,000 mg by mouth every 6 (six) hours as needed.    [provider]  aspirin 81 MG EC tablet Take 1 tablet (81 mg total) by mouth daily. Swallow whole. 11/04/20   Cheral Marker, CNM  busPIRone (BUSPAR) 5 MG tablet TAKE 1 TABLET BY MOUTH TWICE A DAY 05/18/21   Cheral Marker, CNM  ferrous sulfate 325 (65 FE) MG tablet Take 1 tablet (325 mg total) by mouth every other day. 02/07/21   Cheral Marker, CNM  ibuprofen (ADVIL) 600 MG tablet Take 1 tablet (600 mg total) by mouth every 6 (six) hours. 04/19/21   Mumaw, Hiram Comber, DO  NIFEdipine (ADALAT CC) 60 MG 24 hr tablet Take 1 tablet (60 mg total) by mouth daily. Patient not taking: Reported on 05/24/2021 04/26/21   Cheral Marker, CNM  Prenatal Vit-Fe Fumarate-FA (PRENATAL VITAMIN PO) Take 1 tablet by mouth daily.    [provider]  senna-docusate (SENOKOT-S) 8.6-50 MG tablet Take 2 tablets by mouth daily. Patient taking differently: Take 2 tablets by mouth as needed. 04/19/21   Mumaw, Hiram Comber, DO  sertraline  (ZOLOFT) 100 MG tablet Take 1 tablet (100 mg total) by mouth daily. 05/17/21 08/15/21  Myna Hidalgo, DO  triamterene-hydrochlorothiazide (DYAZIDE) 37.5-25 MG capsule Take 1 each (1 capsule total) by mouth daily. 04/22/21 05/22/21  Myna Hidalgo, DO      Allergies    Rocephin [ceftriaxone sodium in dextrose]    Review of Systems   Review of Systems  All other systems reviewed and are negative.   Physical Exam Updated Vital Signs BP (!) 138/95   Pulse 98   Temp 98.2 F (36.8 C) (Oral)   Resp 20   Ht 5\' 8"  (1.727 m)   Wt (!) 140.2 kg   SpO2 100%   BMI 47.00 kg/m  Physical Exam Vitals and nursing note reviewed.  Constitutional:      General: She is not in acute distress.    Appearance: She is well-developed. She is not diaphoretic.  HENT:     Head: Normocephalic and atraumatic.     Mouth/Throat:     Mouth: Mucous membranes are moist.     Pharynx: No oropharyngeal exudate or posterior oropharyngeal erythema.     Comments: The extraction site appears clean.  There is swelling and tenderness of the submental space, but no crepitus. Cardiovascular:     Rate and Rhythm: Normal rate and regular rhythm.     Heart sounds: No murmur heard.    No friction rub. No  gallop.  Pulmonary:     Effort: Pulmonary effort is normal. No respiratory distress.     Breath sounds: Normal breath sounds. No wheezing.  Abdominal:     General: Bowel sounds are normal. There is no distension.     Palpations: Abdomen is soft.     Tenderness: There is no abdominal tenderness.  Musculoskeletal:        General: Normal range of motion.     Cervical back: Normal range of motion and neck supple. No rigidity.  Skin:    General: Skin is warm and dry.  Neurological:     General: No focal deficit present.     Mental Status: She is alert and oriented to person, place, and time.     ED Results / Procedures / Treatments   Labs (all labs ordered are listed, but only abnormal results are displayed) Labs  Reviewed  BASIC METABOLIC PANEL  CBC WITH DIFFERENTIAL/PLATELET  LACTIC ACID, PLASMA  LACTIC ACID, PLASMA  HCG, SERUM, QUALITATIVE    EKG None  Radiology No results found.  Procedures Procedures    Medications Ordered in ED Medications  sodium chloride 0.9 % bolus 1,000 mL (has no administration in time range)    ED Course/ Medical Decision Making/ A&P  Patient is a 36 year old female presenting with pain and swelling in her neck and chin, worsening since a wisdom tooth extraction on Wednesday.  She describes discomfort with eating and swallowing, but no difficulty breathing.  Laboratory studies revealed a mild leukocytosis, but are otherwise unremarkable.  Pregnancy test is negative.  CT scan of the neck with emphasis on soft tissues reveals extensive inflammation in the right submandibular and masticator spaces with edema and soft tissue gas tracking cephalad and into the right parapharyngeal space.  There is also edema along the lateral wall of the pharynx, all concerning for infection.  There is however, no drainable abscess.  I initially spoke with Dr. Suszanne Conners from ENT who says that he does not deal with dental infections, but would place a trach if it came to that.  I then spoke with a Dr. Vear Clock at [facility] who is covering for ENT.  She does not feel as though there is an emergently surgical problem.  It is her recommendation that patient receive IV antibiotics and close airway monitoring.  She has received Levaquin and Flagyl intravenously here in the ER along with morphine for pain and Decadron for inflammation.  Call placed to the hospitalist to discuss admission.  Final Clinical Impression(s) / ED Diagnoses Final diagnoses:  None    Rx / DC Orders ED Discharge Orders     None         Geoffery Lyons, MD 01/21/22 [phone]
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
## 2022-01-21 NOTE — ED Notes (Signed)
ED Provider at bedside. 

## 2022-01-22 DIAGNOSIS — L03211 Cellulitis of face: Secondary | ICD-10-CM | POA: Diagnosis present

## 2022-01-22 DIAGNOSIS — E876 Hypokalemia: Secondary | ICD-10-CM | POA: Diagnosis present

## 2022-01-22 DIAGNOSIS — G4700 Insomnia, unspecified: Secondary | ICD-10-CM | POA: Diagnosis present

## 2022-01-22 DIAGNOSIS — R1310 Dysphagia, unspecified: Secondary | ICD-10-CM | POA: Diagnosis present

## 2022-01-22 DIAGNOSIS — Z8249 Family history of ischemic heart disease and other diseases of the circulatory system: Secondary | ICD-10-CM | POA: Diagnosis not present

## 2022-01-22 DIAGNOSIS — T8189XA Other complications of procedures, not elsewhere classified, initial encounter: Secondary | ICD-10-CM | POA: Diagnosis not present

## 2022-01-22 DIAGNOSIS — F319 Bipolar disorder, unspecified: Secondary | ICD-10-CM | POA: Diagnosis present

## 2022-01-22 DIAGNOSIS — Z79899 Other long term (current) drug therapy: Secondary | ICD-10-CM | POA: Diagnosis not present

## 2022-01-22 DIAGNOSIS — Z8049 Family history of malignant neoplasm of other genital organs: Secondary | ICD-10-CM | POA: Diagnosis not present

## 2022-01-22 DIAGNOSIS — J029 Acute pharyngitis, unspecified: Secondary | ICD-10-CM | POA: Diagnosis present

## 2022-01-22 DIAGNOSIS — F419 Anxiety disorder, unspecified: Secondary | ICD-10-CM | POA: Diagnosis present

## 2022-01-22 DIAGNOSIS — Z833 Family history of diabetes mellitus: Secondary | ICD-10-CM | POA: Diagnosis not present

## 2022-01-22 DIAGNOSIS — K047 Periapical abscess without sinus: Secondary | ICD-10-CM | POA: Diagnosis present

## 2022-01-22 DIAGNOSIS — Z87891 Personal history of nicotine dependence: Secondary | ICD-10-CM | POA: Diagnosis not present

## 2022-01-22 DIAGNOSIS — Z6841 Body Mass Index (BMI) 40.0 and over, adult: Secondary | ICD-10-CM | POA: Diagnosis not present

## 2022-01-22 DIAGNOSIS — Z800 Family history of malignant neoplasm of digestive organs: Secondary | ICD-10-CM | POA: Diagnosis not present

## 2022-01-22 DIAGNOSIS — K1120 Sialoadenitis, unspecified: Secondary | ICD-10-CM | POA: Diagnosis present

## 2022-01-22 DIAGNOSIS — Y838 Other surgical procedures as the cause of abnormal reaction of the patient, or of later complication, without mention of misadventure at the time of the procedure: Secondary | ICD-10-CM | POA: Diagnosis present

## 2022-01-22 DIAGNOSIS — Z881 Allergy status to other antibiotic agents status: Secondary | ICD-10-CM | POA: Diagnosis not present

## 2022-01-22 LAB — BASIC METABOLIC PANEL
Anion gap: 4 — ABNORMAL LOW (ref 5–15)
BUN: 14 mg/dL (ref 6–20)
CO2: 23 mmol/L (ref 22–32)
Calcium: 8 mg/dL — ABNORMAL LOW (ref 8.9–10.3)
Chloride: 112 mmol/L — ABNORMAL HIGH (ref 98–111)
Creatinine, Ser: 0.52 mg/dL (ref 0.44–1.00)
GFR, Estimated: >60 mL/min (ref >60)
Glucose, Bld: 155 mg/dL — ABNORMAL HIGH (ref 70–99)
Potassium: 3.9 mmol/L (ref 3.5–5.1)
Sodium: 139 mmol/L (ref 135–145)

## 2022-01-22 LAB — CBC
HCT: 32.8 % — ABNORMAL LOW (ref 36.0–46.0)
Hemoglobin: 10.1 g/dL — ABNORMAL LOW (ref 12.0–15.0)
MCH: 24.2 pg — ABNORMAL LOW (ref 26.0–34.0)
MCHC: 30.8 g/dL (ref 30.0–36.0)
MCV: 78.7 fL — ABNORMAL LOW (ref 80.0–100.0)
Platelets: 242 10*3/uL (ref 150–400)
RBC: 4.17 MIL/uL (ref 3.87–5.11)
RDW: 15.9 % — ABNORMAL HIGH (ref 11.5–15.5)
WBC: 11 10*3/uL — ABNORMAL HIGH (ref 4.0–10.5)
nRBC: 0 % (ref 0.0–0.2)

## 2022-01-22 LAB — GLUCOSE, CAPILLARY: Glucose-Capillary: 157 mg/dL — ABNORMAL HIGH (ref 70–99)

## 2022-01-22 MED ORDER — TRAZODONE HCL 50 MG PO TABS
100.0000 mg | ORAL_TABLET | Freq: Every day | ORAL | Status: DC
  Administered 2022-01-22: 100 mg via ORAL
  Filled 2022-01-22: qty 2

## 2022-01-22 MED ORDER — ALBUTEROL SULFATE (2.5 MG/3ML) 0.083% IN NEBU: 2.5000 mg | INHALATION_SOLUTION | RESPIRATORY_TRACT | Status: DC | PRN

## 2022-01-22 MED ORDER — IBUPROFEN 600 MG PO TABS: 600.0000 mg | ORAL_TABLET | Freq: Three times a day (TID) | ORAL | Status: DC | PRN

## 2022-01-22 NOTE — Progress Notes (Signed)
PROGRESS NOTE     Summer Terrell, is a 36 y.o. female, DOB - Nov 06, 1985, CVE:[id number]  Admit date - 01/21/2022   Admitting Physician Patsy Varma Mariea Clonts, MD  Outpatient Primary MD for the patient is Pcp, No  LOS - 0  Chief Complaint  Patient presents with   Post-op Problem        Brief Narrative:   36 y.o. 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 -CT of the soft tissue of the neck as noted below --EDP reviewed case with on-call ENT physician at [facility] Dr. Vear Clock as well as on-call ENT physician in [facility] Dr. Christain [place]--- both of them advised that there is no acute intervention needed at this time they both recommended antibiotics close monitoring -Patient is allergic to cephalosporin/beta-lactam antibiotics -Continue IV Levaquin/Flagyl combo along with IV steroids --WBC 10.8 >>.11.0 ... Patient has a history of chronic leukocytosis -No fevers or chills --Lactic acid is not elevated 01/22/22--oropharyngeal swelling and pain improving, -Odynophagia improving   2)Hypokalemia--- normalized post replacement  3)Morbid Obesity- -Low calorie diet, portion control and increase physical activity discussed with patient -Body mass index is 42.1 kg/m.   4)bipolar disorder/anxiety disorder---  hydroxyzine as needed and trazodone nightly  5)History of PIH followed by postpartum hypertension----patient is currently off BP meds and BP stable -BP may rise with  neck pain and steroids  Code Status :  -  Code Status: Full Code   Family Communication:    NA (patient is alert, awake and coherent)   DVT Prophylaxis  :   - SCDs   heparin injection 5,000 Units Start: 01/21/22 0800 SCDs Start: 01/21/22 0746 Place TED hose Start: 01/21/22 0746   Lab Results  Component Value Date   PLT 242 01/22/2022    Inpatient Medications  Scheduled Meds:  ferrous sulfate  325 mg Oral QODAY   heparin  5,000 Units Subcutaneous Q8H   methylPREDNISolone (SOLU-MEDROL) injection  40 mg Intravenous Q12H   senna-docusate  2 tablet Oral QHS   sodium chloride flush  3 mL Intravenous Q12H   sodium chloride flush  3 mL Intravenous Q12H   traZODone  100 mg Oral QHS   Continuous Infusions:  sodium chloride     sodium chloride 75 mL/hr at 01/22/22 1133   levofloxacin (LEVAQUIN) IV 750 mg (01/22/22 0258)   metronidazole 500 mg (01/22/22 1323)   PRN Meds:.sodium chloride, acetaminophen **OR** acetaminophen, albuterol, bisacodyl, fentaNYL (SUBLIMAZE) injection, hydrALAZINE, hydrOXYzine, ibuprofen, ondansetron **OR** ondansetron (ZOFRAN) IV, oxyCODONE, polyethylene glycol, sodium chloride flush   Anti-infectives (From admission, onward)    Start     Dose/Rate Route Frequency Ordered Stop   01/22/22 0600  levofloxacin (LEVAQUIN) IVPB 750 mg        750 mg 100 mL/hr over 90 Minutes Intravenous Every 24 hours 01/21/22 1236     01/21/22 1330  clindamycin (CLEOCIN) IVPB 900 mg  Status:  Discontinued        900 mg 100 mL/hr over 30 Minutes Intravenous Every 8 hours 01/21/22 1230 01/21/22 1236   01/21/22 1330  metroNIDAZOLE (FLAGYL) IVPB  500 mg        500 mg 100 mL/hr over 60 Minutes Intravenous Every 8 hours 01/21/22 1236     01/21/22 0515  levofloxacin (LEVAQUIN) IVPB 750 mg  Status:  Discontinued       See Hyperspace for full Linked Orders Report.   750 mg 100 mL/hr over 90 Minutes Intravenous Every 24 hours 01/21/22 0507 01/21/22 1230   01/21/22 0515  metroNIDAZOLE  (FLAGYL) IVPB 500 mg  Status:  Discontinued       See Hyperspace for full Linked Orders Report.   500 mg 100 mL/hr over 60 Minutes Intravenous Every 12 hours 01/21/22 0507 01/21/22 1230         Subjective: Summer Terrell today has no fevers, no emesis,  No chest pain,   -Oropharyngeal pain improving -Odynophagia and swallowing improving  Objective: Vitals:   01/21/22 1746 01/21/22 2127 01/22/22 0510 01/22/22 1341  BP: 125/67 120/66 (!) 110/50 128/70  Pulse: (!) 54 70 (!) 52 77  Resp: 19 19 19 18   Temp: 97.8 F (36.6 C) 98.6 F (37 C) 98 F (36.7 C) 98.6 F (37 C)  TempSrc:    Oral  SpO2: 100% 99% 100% 99%  Weight:      Height:        Intake/Output Summary (Last 24 hours) at 01/22/2022 1713 Last data filed at 01/22/2022 1232 Gross per 24 hour  Intake 4188.47 ml  Output --  Net 4188.47 ml   Filed Weights   01/21/22 0454 01/21/22 0915  Weight: (!) 140.2 kg 125.6 kg    Physical Exam  Gen:- Awake Alert,  in no apparent distress  HEENT/NECK:- [place].AT, No sclera icterus, Neck is supple-slowly improving submandibular, right cervical and submental area swelling and tenderness on palpation, no crepitus on exam  Lungs-  CTAB , fair symmetrical air movement CV- S1, S2 normal, regular  Abd-  +ve B.Sounds, Abd Soft, No tenderness, increased truncal adiposity    Extremity/Skin:- No  edema, pedal pulses present  Psych-affect is appropriate, oriented x3 Neuro-no new focal deficits, no tremors  Data Reviewed: I have personally reviewed following labs and imaging studies  CBC: Recent Labs  Lab 01/21/22 0500 01/22/22 0554  WBC 10.8* 11.0*  NEUTROABS 8.1*  --   HGB 11.2* 10.1*  HCT 35.8* 32.8*  MCV 77.2* 78.7*  PLT 276 242   Basic Metabolic Panel: Recent Labs  Lab 01/21/22 0500 01/22/22 0554  NA 138 139  K 3.4* 3.9  CL 104 112*  CO2 28 23  GLUCOSE 110* 155*  BUN 10 14  CREATININE 0.61 0.52  CALCIUM 8.7* 8.0*   GFR: Estimated Creatinine Clearance: 137.3 mL/min  (by C-G formula based on SCr of 0.52 mg/dL). Liver Function Tests: No results for input(s): "AST", "ALT", "ALKPHOS", "BILITOT", "PROT", "ALBUMIN" in the last 168 hours. Cardiac Enzymes: No results for input(s): "CKTOTAL", "CKMB", "CKMBINDEX", "TROPONINI" in the last 168 hours. BNP (last 3 results) No results for input(s): "PROBNP" in the last 8760 hours. HbA1C: No results for input(s): "HGBA1C" in the last 72 hours. Sepsis Labs: @LABRCNTIP (procalcitonin:4,lacticidven:4) )No results found for this or any previous visit (from the past 240 hour(s)).    Radiology Studies: CT Soft Tissue Neck W Contrast  Result Date: 01/21/2022 CLINICAL DATA:  36 year old female with swelling of the lower jaw and face. Recent dental surgery, wisdom tooth extraction. EXAM: CT NECK WITH CONTRAST TECHNIQUE: Multidetector CT imaging of the neck was performed using the standard protocol following the bolus administration of intravenous contrast.  RADIATION DOSE REDUCTION: This exam was performed according to the departmental dose-optimization program which includes automated exposure control, adjustment of the mA and/or kV according to patient size and/or use of iterative reconstruction technique. CONTRAST:  72mL OMNIPAQUE IOHEXOL 300 MG/ML  SOLN COMPARISON:  None Available. FINDINGS: Pharynx and larynx: Larynx and epiglottis remain within normal limits. But there is soft tissue swelling along the entire lateral wall of the right pharynx. Other pharyngeal soft tissue contours are within normal limits. There is gas and inflammation in the right parapharyngeal space (series 2, image 37) although more pronounced in the right submandibular space below. Left parapharyngeal and retropharyngeal spaces remain negative. Salivary glands: Central sublingual space remains normal. Left submandibular space and gland are within normal limits. Parotid glands remain within normal limits. Abnormal gas and edema/inflammation in the right  submandibular space and tracking medial to the posterior body and ramus of the right mandible (series 2 images 44 through 54. Thickening of the right platysma. Subcutaneous stranding which tracks across midline in the submental region. Mild mass effect on the right submandibular gland which is mildly hyperenhancing. Inflammation ascends in the right masticator space. Thyroid: Negative. Lymph nodes: Reactive right level 1 B lymph nodes. Reactive right level 2 lymph nodes individually up to 14 mm short axis. No cystic or necrotic nodes. Vascular: Major vascular structures in the neck and at the skull base remain patent. Tortuous cervical right ICA. Limited intracranial: Negative. Visualized orbits: Negative. Mastoids and visualized paranasal sinuses: Clear throughout. Skeleton: Right mandible wisdom tooth extraction site with cortical breakthrough at the apex along the medial posterior body of the mandible (sagittal image 39). Adjacent right mandible molars seem to remain within normal limits. Elsewhere the mandible appears intact and is normally located. No other acute osseous abnormality identified. Upper chest: Negative. Other: No organized fluid collection. IMPRESSION: 1. Right mandible wisdom tooth extraction site with cortical breakthrough at the tooth apex along the medial posterior mandible body. 2. Superimposed extensive inflammation in the right submandibular and masticator spaces, with edema and soft tissue gas tracking cephalad and into the right parapharyngeal space. Edema along the entire right lateral wall of the pharynx. Secondary inflammation of the right submandibular gland. Reactive right level 1 and 2 lymphadenopathy. 3. But no organized fluid collection at this time to suggest abscess. Electronically Signed   By: Odessa Fleming M.D.   On: 01/21/2022 06:31     Scheduled Meds:  ferrous sulfate  325 mg Oral QODAY   heparin  5,000 Units Subcutaneous Q8H   methylPREDNISolone (SOLU-MEDROL) injection  40  mg Intravenous Q12H   senna-docusate  2 tablet Oral QHS   sodium chloride flush  3 mL Intravenous Q12H   sodium chloride flush  3 mL Intravenous Q12H   traZODone  100 mg Oral QHS   Continuous Infusions:  sodium chloride     sodium chloride 75 mL/hr at 01/22/22 1133   levofloxacin (LEVAQUIN) IV 750 mg (01/22/22 4827)   metronidazole 500 mg (01/22/22 1323)     LOS: 0 days    Shon Hale M.D on 01/22/2022 at 5:13 PM  Go to [email or web address] - for contact info  [facility] - Office  [phone]  If 7PM-7AM, please contact night-coverage [email or web address] 01/22/2022, 5:13 PM

## 2022-01-22 NOTE — Progress Notes (Signed)
Face flushed but no rash or difficulty breathing.  Contacted Dr. Marisa Severin concerning redness.  He okayed shower and gave no new orders.

## 2022-01-22 NOTE — Progress Notes (Signed)
Has not complained of pain today and denies any difficulty breathing or feelings of restricted airway.  Asked to shower.

## 2022-01-23 DIAGNOSIS — K047 Periapical abscess without sinus: Secondary | ICD-10-CM | POA: Diagnosis not present

## 2022-01-23 MED ORDER — PREDNISONE 20 MG PO TABS: 40.0000 mg | ORAL_TABLET | Freq: Every day | ORAL | 0 refills | Status: AC

## 2022-01-23 MED ORDER — FLUCONAZOLE 150 MG PO TABS: 150.0000 mg | ORAL_TABLET | Freq: Once | ORAL | 0 refills | Status: AC

## 2022-01-23 MED ORDER — HYDROXYZINE HCL 25 MG PO TABS: 25.0000 mg | ORAL_TABLET | Freq: Three times a day (TID) | ORAL | 3 refills | Status: DC | PRN

## 2022-01-23 MED ORDER — TRAZODONE HCL 50 MG PO TABS: 50.0000 mg | ORAL_TABLET | Freq: Every evening | ORAL | 3 refills | Status: DC | PRN

## 2022-01-23 MED ORDER — METRONIDAZOLE 500 MG PO TABS: 500.0000 mg | ORAL_TABLET | Freq: Three times a day (TID) | ORAL | 0 refills | Status: AC

## 2022-01-23 MED ORDER — LEVOFLOXACIN 750 MG PO TABS: 750.0000 mg | ORAL_TABLET | Freq: Every day | ORAL | 0 refills | Status: AC
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
## 2022-01-23 NOTE — Progress Notes (Signed)
Patient slept throughout the night only waking to have vitals taken, Swelling and redness still to the right jaw with no change. No complaints of pain. Continuing to monitor patient.

## 2022-01-23 NOTE — TOC Progression Note (Signed)
  Transition of Care [facility]) Screening Note   Patient Details  Name: Summer Terrell Date of Birth: [birth year]   Transition of Care [facility]) CM/SW Contact:    Leitha Bleak, RN Phone Number: 01/23/2022, 10:23 AM    Transition of Care Department [facility]) has reviewed patient and no TOC needs have been identified at this time. We will continue to monitor patient advancement through interdisciplinary progression rounds. If new patient transition needs arise, please place a TOC consult.     Barriers to Discharge: No Barriers Identified  Expected Discharge Plan and Services        Expected Discharge Date: 01/23/22

## 2022-01-23 NOTE — Progress Notes (Signed)
Nsg Discharge Note  Admit Date:  01/21/2022 Discharge date: 01/23/2022   Summer Terrell to be D/C'd Home per MD order.  AVS completed.  Copy for chart, and copy for patient signed, and dated. Patient/caregiver able to verbalize understanding.  Discharge Medication: Allergies as of 01/23/2022       Reactions   Rocephin [ceftriaxone Sodium In Dextrose] Anaphylaxis, Hives, Shortness Of Breath        Medication List     STOP taking these medications    hydrochlorothiazide 25 MG tablet Commonly known as: HYDRODIURIL   HYDROcodone-acetaminophen 5-325 MG tablet Commonly known as: NORCO/VICODIN   ibuprofen 600 MG tablet Commonly known as: ADVIL       TAKE these medications    acetaminophen 500 MG tablet Commonly known as: TYLENOL Take 1,000 mg by mouth every 6 (six) hours as needed.   fluconazole 150 MG tablet Commonly known as: Diflucan Take 1 tablet (150 mg total) by mouth once for 1 dose. For yeast infection   HAIR/SKIN/NAILS PO Take by mouth.   hydrOXYzine 25 MG tablet Commonly known as: ATARAX Take 1 tablet (25 mg total) by mouth 3 (three) times daily as needed for anxiety (sleep).   levofloxacin 750 MG tablet Commonly known as: Levaquin Take 1 tablet (750 mg total) by mouth daily for 7 days.   metroNIDAZOLE 500 MG tablet Commonly known as: FLAGYL Take 1 tablet (500 mg total) by mouth 3 (three) times daily for 7 days.   predniSONE 20 MG tablet Commonly known as: DELTASONE Take 2 tablets (40 mg total) by mouth daily with breakfast for 5 days.   traZODone 50 MG tablet Commonly known as: DESYREL Take 1 tablet (50 mg total) by mouth at bedtime as needed for sleep.   vitamin E 180 MG (400 UNITS) capsule Generic drug: vitamin E Take 400 Units by mouth daily.        Discharge Assessment: Vitals:   01/22/22 2029 01/23/22 0539  BP: 127/60 (!) 129/53  Pulse: (!) 49 (!) 50  Resp: 17 19  Temp: 97.8 F (36.6 C) 98 F (36.7 C)  SpO2: 100% 98%   Skin  clean, dry and intact without evidence of skin break down, no evidence of skin tears noted. IV catheter discontinued intact. Site without signs and symptoms of complications - no redness or edema noted at insertion site, patient denies c/o pain - only slight tenderness at site.  Dressing with slight pressure applied.  D/c Instructions-Education: Discharge instructions given to patient/family with verbalized understanding. D/c education completed with patient/family including follow up instructions, medication list, d/c activities limitations if indicated, with other d/c instructions as indicated by MD - patient able to verbalize understanding, all questions fully answered. Patient instructed to return to ED, call 911, or call MD for any changes in condition.  Patient escorted via WC, and D/C home via private auto.  Brandy Hale, LPN 2/68/3419 6:22 PM

## 2022-01-23 NOTE — Discharge Instructions (Signed)
1)follow up with PCP within 1 week for recheck 2) please take medications as prescribed

## 2022-03-10 ENCOUNTER — Other Ambulatory Visit: Payer: Self-pay

## 2022-03-10 ENCOUNTER — Emergency Department (HOSPITAL_COMMUNITY): Payer: Medicaid Other

## 2022-03-10 ENCOUNTER — Emergency Department (HOSPITAL_COMMUNITY)
Admission: EM | Admit: 2022-03-10 | Discharge: 2022-03-10 | Disposition: A | Discharge status: Home / Self Care | Payer: Medicaid Other | Attending: Emergency Medicine | Admitting: Emergency Medicine

## 2022-03-10 ENCOUNTER — Encounter (HOSPITAL_COMMUNITY): Payer: Self-pay | Admitting: *Deleted

## 2022-03-10 DIAGNOSIS — M545 Low back pain, unspecified: Secondary | ICD-10-CM | POA: Insufficient documentation

## 2022-03-10 DIAGNOSIS — Y92410 Unspecified street and highway as the place of occurrence of the external cause: Secondary | ICD-10-CM | POA: Diagnosis not present

## 2022-03-10 MED ORDER — NAPROXEN 500 MG PO TABS: 500.0000 mg | ORAL_TABLET | Freq: Two times a day (BID) | ORAL | 0 refills | Status: DC

## 2022-03-10 MED ORDER — LIDOCAINE 5 % EX PTCH: 1.0000 | MEDICATED_PATCH | CUTANEOUS | 0 refills | Status: DC

## 2022-03-10 MED ORDER — METHOCARBAMOL 500 MG PO TABS: 500.0000 mg | ORAL_TABLET | Freq: Two times a day (BID) | ORAL | 0 refills | Status: DC
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
## 2022-05-01 ENCOUNTER — Encounter: Payer: Self-pay | Admitting: *Deleted

## 2022-05-01 ENCOUNTER — Ambulatory Visit (INDEPENDENT_AMBULATORY_CARE_PROVIDER_SITE_OTHER): Payer: Medicaid Other | Admitting: *Deleted

## 2022-05-01 VITALS — BP 115/70 | HR 73 | Ht 68.0 in | Wt 296.5 lb

## 2022-05-01 DIAGNOSIS — Z3201 Encounter for pregnancy test, result positive: Secondary | ICD-10-CM | POA: Diagnosis not present

## 2022-05-01 LAB — POCT URINE PREGNANCY: Preg Test, Ur: POSITIVE — AB

## 2022-05-01 NOTE — Progress Notes (Signed)
   NURSE VISIT- PREGNANCY CONFIRMATION   SUBJECTIVE:  Summer Terrell is a 36 y.o. [id number] female at [redacted]w[redacted]d by certain LMP of Patient's last menstrual period was 03/06/2022 (approximate). Here for pregnancy confirmation.  Home pregnancy test: positive x 2.   She reports no complaints.  She is taking prenatal gummies.    OBJECTIVE:  BP 115/70 (BP Location: Left Arm, Patient Position: Sitting, Cuff Size: Large)   Pulse 73   Ht 5\' 8"  (1.727 m)   Wt 296 lb 8 oz (134.5 kg)   LMP 03/06/2022 (Approximate)   Breastfeeding No   BMI 45.08 kg/m   Appears well, in no apparent distress  Results for orders placed or performed in visit on 05/01/22 (from the past 24 hour(s))  POCT urine pregnancy   Collection Time: 05/01/22  4:01 PM  Result Value Ref Range   Preg Test, Ur Positive (A) Negative    ASSESSMENT: Positive pregnancy test, [redacted]w[redacted]d by LMP    PLAN: Schedule for dating ultrasound in ASAP!!  Prenatal vitamins: continue   Nausea medicines: not currently needed   OB packet given: Yes  Levy Pupa  05/01/2022 4:04 PM

## 2022-05-04 ENCOUNTER — Encounter: Payer: Self-pay | Admitting: Women's Health

## 2022-05-07 ENCOUNTER — Encounter (HOSPITAL_COMMUNITY): Payer: Self-pay | Admitting: *Deleted

## 2022-05-07 ENCOUNTER — Other Ambulatory Visit: Payer: Self-pay

## 2022-05-07 ENCOUNTER — Emergency Department (HOSPITAL_COMMUNITY)
Admission: EM | Admit: 2022-05-07 | Discharge: 2022-05-07 | Discharge status: Against Medical Advice | Payer: Medicaid Other | Attending: Emergency Medicine | Admitting: Emergency Medicine

## 2022-05-07 DIAGNOSIS — R062 Wheezing: Secondary | ICD-10-CM | POA: Insufficient documentation

## 2022-05-07 DIAGNOSIS — R0602 Shortness of breath: Secondary | ICD-10-CM | POA: Diagnosis not present

## 2022-05-07 DIAGNOSIS — R0789 Other chest pain: Secondary | ICD-10-CM | POA: Insufficient documentation

## 2022-05-07 DIAGNOSIS — R059 Cough, unspecified: Secondary | ICD-10-CM | POA: Insufficient documentation

## 2022-05-07 DIAGNOSIS — Z5321 Procedure and treatment not carried out due to patient leaving prior to being seen by health care provider: Secondary | ICD-10-CM | POA: Insufficient documentation

## 2022-05-07 DIAGNOSIS — R0600 Dyspnea, unspecified: Secondary | ICD-10-CM

## 2022-05-07 DIAGNOSIS — Z3A08 8 weeks gestation of pregnancy: Secondary | ICD-10-CM | POA: Insufficient documentation

## 2022-05-07 DIAGNOSIS — O26891 Other specified pregnancy related conditions, first trimester: Secondary | ICD-10-CM | POA: Diagnosis present

## 2022-05-07 NOTE — ED Triage Notes (Signed)
Pt with SOB and wheezing, cough 4 days ago.  Pt admits to having some chest tightness. Pt states she is over [redacted] weeks pregnant-denies any issues with pregnancy.

## 2022-05-24 ENCOUNTER — Encounter: Payer: Self-pay | Admitting: Women's Health

## 2022-05-25 ENCOUNTER — Other Ambulatory Visit: Payer: Self-pay | Admitting: Adult Health

## 2022-05-25 ENCOUNTER — Ambulatory Visit: Payer: Medicaid Other | Admitting: Adult Health

## 2022-05-25 ENCOUNTER — Other Ambulatory Visit (HOSPITAL_COMMUNITY)
Admission: RE | Admit: 2022-05-25 | Discharge: 2022-05-25 | Disposition: A | Discharge status: Home / Self Care | Payer: Medicaid Other | Source: Ambulatory Visit | Attending: Obstetrics & Gynecology | Admitting: Obstetrics & Gynecology

## 2022-05-25 ENCOUNTER — Encounter: Payer: Self-pay | Admitting: Women's Health

## 2022-05-25 ENCOUNTER — Other Ambulatory Visit (INDEPENDENT_AMBULATORY_CARE_PROVIDER_SITE_OTHER): Payer: Medicaid Other | Admitting: *Deleted

## 2022-05-25 DIAGNOSIS — Z113 Encounter for screening for infections with a predominantly sexual mode of transmission: Secondary | ICD-10-CM | POA: Diagnosis not present

## 2022-05-25 MED ORDER — AZITHROMYCIN 500 MG PO TABS: ORAL_TABLET | ORAL | 0 refills | Status: DC

## 2022-05-25 NOTE — Progress Notes (Signed)
Had contact with [facility] will rx azithromycin 1 gm

## 2022-05-25 NOTE — Progress Notes (Signed)
   NURSE VISIT- VAGINITIS/STD/POC  SUBJECTIVE:  Summer Terrell is a 36 y.o. [id number] [redacted]w[redacted]d pregnantfemale here for a vaginal swab for STD screen.  She reports the following symptoms: none for 0 days. Denies abnormal vaginal bleeding, significant pelvic pain, fever, or UTI symptoms. Patients partner tested positive for nongonococcal urethritis.   OBJECTIVE:  LMP 03/06/2022 (Approximate)   Appears well, in no apparent distress  ASSESSMENT: Vaginal swab for STD screen  PLAN: Self-collected vaginal probe for Gonorrhea, Chlamydia, Trichomonas, Bacterial Vaginosis, Yeast sent to lab Treatment: to be determined once results are received Follow-up as needed if symptoms persist/worsen, or new symptoms develop  Annamarie Dawley  05/25/2022 2:42 PM

## 2022-05-26 ENCOUNTER — Other Ambulatory Visit: Payer: Self-pay | Admitting: Obstetrics & Gynecology

## 2022-05-26 DIAGNOSIS — Z3682 Encounter for antenatal screening for nuchal translucency: Secondary | ICD-10-CM

## 2022-05-29 ENCOUNTER — Encounter: Payer: Self-pay | Admitting: Women's Health

## 2022-05-29 ENCOUNTER — Ambulatory Visit: Payer: Self-pay | Admitting: *Deleted

## 2022-05-29 ENCOUNTER — Ambulatory Visit (INDEPENDENT_AMBULATORY_CARE_PROVIDER_SITE_OTHER): Payer: Medicaid Other | Admitting: Women's Health

## 2022-05-29 ENCOUNTER — Ambulatory Visit (INDEPENDENT_AMBULATORY_CARE_PROVIDER_SITE_OTHER): Payer: Medicaid Other

## 2022-05-29 VITALS — BP 123/73 | HR 81 | Wt 305.0 lb

## 2022-05-29 DIAGNOSIS — Z3A12 12 weeks gestation of pregnancy: Secondary | ICD-10-CM

## 2022-05-29 DIAGNOSIS — Z3490 Encounter for supervision of normal pregnancy, unspecified, unspecified trimester: Secondary | ICD-10-CM | POA: Insufficient documentation

## 2022-05-29 DIAGNOSIS — Z3480 Encounter for supervision of other normal pregnancy, unspecified trimester: Secondary | ICD-10-CM

## 2022-05-29 DIAGNOSIS — Z1330 Encounter for screening examination for mental health and behavioral disorders, unspecified: Secondary | ICD-10-CM | POA: Diagnosis not present

## 2022-05-29 DIAGNOSIS — Z8679 Personal history of other diseases of the circulatory system: Secondary | ICD-10-CM

## 2022-05-29 DIAGNOSIS — O0990 Supervision of high risk pregnancy, unspecified, unspecified trimester: Secondary | ICD-10-CM | POA: Insufficient documentation

## 2022-05-29 DIAGNOSIS — Z3682 Encounter for antenatal screening for nuchal translucency: Secondary | ICD-10-CM | POA: Diagnosis not present

## 2022-05-29 DIAGNOSIS — O09299 Supervision of pregnancy with other poor reproductive or obstetric history, unspecified trimester: Secondary | ICD-10-CM

## 2022-05-29 DIAGNOSIS — Z3481 Encounter for supervision of other normal pregnancy, first trimester: Secondary | ICD-10-CM

## 2022-05-29 DIAGNOSIS — Z6841 Body Mass Index (BMI) 40.0 and over, adult: Secondary | ICD-10-CM | POA: Diagnosis not present

## 2022-05-29 DIAGNOSIS — Z8759 Personal history of other complications of pregnancy, childbirth and the puerperium: Secondary | ICD-10-CM

## 2022-05-29 LAB — CERVICOVAGINAL ANCILLARY ONLY
Chlamydia: NEGATIVE
Comment: NEGATIVE
Comment: NORMAL
Neisseria Gonorrhea: NEGATIVE

## 2022-05-29 MED ORDER — ASPIRIN 81 MG PO TBEC: 162.0000 mg | DELAYED_RELEASE_TABLET | Freq: Every day | ORAL | 2 refills | Status: DC

## 2022-05-29 NOTE — Patient Instructions (Signed)
Summer Terrell, thank you for choosing our office today! We appreciate the opportunity to meet your healthcare needs. You may receive a short survey by mail, e-mail, or through [employer]. If you are happy with your care we would appreciate if you could take just a few minutes to complete the survey questions. We read all of your comments and take your feedback very seriously. Thank you again for choosing our office.  Center for [employer] [facility] Team at [facility]  [facility] at [facility] ([address] [place], [place] [address]) Entrance C, located off of [address] Free 24/7 valet parking   Nausea & Vomiting Have saltine crackers or pretzels by your bed and eat a few bites before you raise your head out of bed in the morning Eat small frequent meals throughout the day instead of large meals Drink plenty of fluids throughout the day to stay hydrated, just don't drink a lot of fluids with your meals.  This can make your stomach fill up faster making you feel sick Do not brush your teeth right after you eat Products with real ginger are good for nausea, like ginger ale and ginger hard candy Make sure it says made with real ginger! Sucking on sour candy like lemon heads is also good for nausea If your prenatal vitamins make you nauseated, take them at night so you will sleep through the nausea Sea Bands If you feel like you need medicine for the nausea & vomiting please let us know If you are unable to keep any fluids or food down please let us know   Constipation Drink plenty of fluid, preferably water, throughout the day Eat foods high in fiber such as fruits, vegetables, and grains Exercise, such as walking, is a good way to keep your bowels regular Drink warm fluids, especially warm prune juice, or decaf coffee Eat a 1/2 cup of real oatmeal (not instant), 1/2 cup applesauce, and 1/2-1 cup warm prune juice every day If needed, you may take Colace (docusate sodium) stool softener  once or twice a day to help keep the stool soft.  If you still are having problems with constipation, you may take Miralax once daily as needed to help keep your bowels regular.   Home Blood Pressure Monitoring for Patients   Your provider has recommended that you check your blood pressure (BP) at least once a week at home. If you do not have a blood pressure cuff at home, one will be provided for you. Contact your provider if you have not received your monitor within 1 week.   Helpful Tips for Accurate Home Blood Pressure Checks  Don't smoke, exercise, or drink caffeine 30 minutes before checking your BP Use the restroom before checking your BP (a full bladder can raise your pressure) Relax in a comfortable upright chair Feet on the ground Left arm resting comfortably on a flat surface at the level of your heart Legs uncrossed Back supported Sit quietly and don't talk Place the cuff on your bare arm Adjust snuggly, so that only two fingertips can fit between your skin and the top of the cuff Check 2 readings separated by at least one minute Keep a log of your BP readings For a visual, please reference this diagram: http://ccnc.care/bpdiagram  Provider Name: [facility]     Phone: [phone]  Zone 1: ALL CLEAR  Continue to monitor your symptoms:  BP reading is less than 140 (top number) or less than 90 (bottom  number)  No right upper stomach pain No headaches or seeing spots No feeling nauseated or throwing up No swelling in face and hands  Zone 2: CAUTION Call your doctor's office for any of the following:  BP reading is greater than 140 (top number) or greater than 90 (bottom number)  Stomach pain under your ribs in the middle or right side Headaches or seeing spots Feeling nauseated or throwing up Swelling in face and hands  Zone 3: EMERGENCY  Seek immediate medical care if you have any of the following:  BP reading is greater than160 (top number) or greater than  110 (bottom number) Severe headaches not improving with Tylenol Serious difficulty catching your breath Any worsening symptoms from Zone 2    First Trimester of Pregnancy The first trimester of pregnancy is from week 1 until the end of week 12 (months 1 through 3). A week after a sperm fertilizes an egg, the egg will implant on the wall of the uterus. This embryo will begin to develop into a baby. Genes from you and your partner are forming the baby. The female genes determine whether the baby is a boy or a girl. At 6-8 weeks, the eyes and face are formed, and the heartbeat can be seen on ultrasound. At the end of 12 weeks, all the baby's organs are formed.  Now that you are pregnant, you will want to do everything you can to have a healthy baby. Two of the most important things are to get good prenatal care and to follow your health care provider's instructions. Prenatal care is all the medical care you receive before the baby's birth. This care will help prevent, find, and treat any problems during the pregnancy and childbirth. BODY CHANGES Your body goes through many changes during pregnancy. The changes vary from woman to woman.  You may gain or lose a couple of pounds at first. You may feel sick to your stomach (nauseous) and throw up (vomit). If the vomiting is uncontrollable, call your health care provider. You may tire easily. You may develop headaches that can be relieved by medicines approved by your health care provider. You may urinate more often. Painful urination may mean you have a bladder infection. You may develop heartburn as a result of your pregnancy. You may develop constipation because certain hormones are causing the muscles that push waste through your intestines to slow down. You may develop hemorrhoids or swollen, bulging veins (varicose veins). Your breasts may begin to grow larger and become tender. Your nipples may stick out more, and the tissue that surrounds them  (areola) may become darker. Your gums may bleed and may be sensitive to brushing and flossing. Dark spots or blotches (chloasma, mask of pregnancy) may develop on your face. This will likely fade after the baby is born. Your menstrual periods will stop. You may have a loss of appetite. You may develop cravings for certain kinds of food. You may have changes in your emotions from day to day, such as being excited to be pregnant or being concerned that something may go wrong with the pregnancy and baby. You may have more vivid and strange dreams. You may have changes in your hair. These can include thickening of your hair, rapid growth, and changes in texture. Some women also have hair loss during or after pregnancy, or hair that feels dry or thin. Your hair will most likely return to normal after your baby is born. WHAT TO EXPECT AT YOUR PRENATAL  VISITS During a routine prenatal visit: You will be weighed to make sure you and the baby are growing normally. Your blood pressure will be taken. Your abdomen will be measured to track your baby's growth. The fetal heartbeat will be listened to starting around week 10 or 12 of your pregnancy. Test results from any previous visits will be discussed. Your health care provider may ask you: How you are feeling. If you are feeling the baby move. If you have had any abnormal symptoms, such as leaking fluid, bleeding, severe headaches, or abdominal cramping. If you have any questions. Other tests that may be performed during your first trimester include: Blood tests to find your blood type and to check for the presence of any previous infections. They will also be used to check for low iron levels (anemia) and Rh antibodies. Later in the pregnancy, blood tests for diabetes will be done along with other tests if problems develop. Urine tests to check for infections, diabetes, or protein in the urine. An ultrasound to confirm the proper growth and development  of the baby. An amniocentesis to check for possible genetic problems. Fetal screens for spina bifida and Down syndrome. You may need other tests to make sure you and the baby are doing well. HOME CARE INSTRUCTIONS  Medicines Follow your health care provider's instructions regarding medicine use. Specific medicines may be either safe or unsafe to take during pregnancy. Take your prenatal vitamins as directed. If you develop constipation, try taking a stool softener if your health care provider approves. Diet Eat regular, well-balanced meals. Choose a variety of foods, such as meat or vegetable-based protein, fish, milk and low-fat dairy products, vegetables, fruits, and whole grain breads and cereals. Your health care provider will help you determine the amount of weight gain that is right for you. Avoid raw meat and uncooked cheese. These carry germs that can cause birth defects in the baby. Eating four or five small meals rather than three large meals a day may help relieve nausea and vomiting. If you start to feel nauseous, eating a few soda crackers can be helpful. Drinking liquids between meals instead of during meals also seems to help nausea and vomiting. If you develop constipation, eat more high-fiber foods, such as fresh vegetables or fruit and whole grains. Drink enough fluids to keep your urine clear or pale yellow. Activity and Exercise Exercise only as directed by your health care provider. Exercising will help you: Control your weight. Stay in shape. Be prepared for labor and delivery. Experiencing pain or cramping in the lower abdomen or low back is a good sign that you should stop exercising. Check with your health care provider before continuing normal exercises. Try to avoid standing for long periods of time. Move your legs often if you must stand in one place for a long time. Avoid heavy lifting. Wear low-heeled shoes, and practice good posture. You may continue to have sex  unless your health care provider directs you otherwise. Relief of Pain or Discomfort Wear a good support bra for breast tenderness.   Take warm sitz baths to soothe any pain or discomfort caused by hemorrhoids. Use hemorrhoid cream if your health care provider approves.   Rest with your legs elevated if you have leg cramps or low back pain. If you develop varicose veins in your legs, wear support hose. Elevate your feet for 15 minutes, 3-4 times a day. Limit salt in your diet. Prenatal Care Schedule your prenatal visits by the  twelfth week of pregnancy. They are usually scheduled monthly at first, then more often in the last 2 months before delivery. Write down your questions. Take them to your prenatal visits. Keep all your prenatal visits as directed by your health care provider. Safety Wear your seat belt at all times when driving. Make a list of emergency phone numbers, including numbers for family, friends, the hospital, and police and fire departments. General Tips Ask your health care provider for a referral to a local prenatal education class. Begin classes no later than at the beginning of month 6 of your pregnancy. Ask for help if you have counseling or nutritional needs during pregnancy. Your health care provider can offer advice or refer you to specialists for help with various needs. Do not use hot tubs, steam rooms, or saunas. Do not douche or use tampons or scented sanitary pads. Do not cross your legs for long periods of time. Avoid cat litter boxes and soil used by cats. These carry germs that can cause birth defects in the baby and possibly loss of the fetus by miscarriage or stillbirth. Avoid all smoking, herbs, alcohol, and medicines not prescribed by your health care provider. Chemicals in these affect the formation and growth of the baby. Schedule a dentist appointment. At home, brush your teeth with a soft toothbrush and be gentle when you floss. SEEK MEDICAL CARE IF:   You have dizziness. You have mild pelvic cramps, pelvic pressure, or nagging pain in the abdominal area. You have persistent nausea, vomiting, or diarrhea. You have a bad smelling vaginal discharge. You have pain with urination. You notice increased swelling in your face, hands, legs, or ankles. SEEK IMMEDIATE MEDICAL CARE IF:  You have a fever. You are leaking fluid from your vagina. You have spotting or bleeding from your vagina. You have severe abdominal cramping or pain. You have rapid weight gain or loss. You vomit blood or material that looks like coffee grounds. You are exposed to [place] measles and have never had them. You are exposed to fifth disease or chickenpox. You develop a severe headache. You have shortness of breath. You have any kind of trauma, such as from a fall or a car accident. Document Released: 06/06/2001 Document Revised: 10/27/2013 Document Reviewed: 04/22/2013 [facility] Patient Information 2015 [place], [place]. This information is not intended to replace advice given to you by your health care provider. Make sure you discuss any questions you have with your health care provider.

## 2022-05-29 NOTE — Progress Notes (Signed)
INITIAL OBSTETRICAL VISIT Patient name: Summer Terrell MRN [id number]  Date of birth: 03-20-1986 Chief Complaint:   Initial Prenatal Visit  History of Present Illness:   Summer Terrell is a 36 y.o. [id number] Caucasian female at [redacted]w[redacted]d by LMP c/w u/s at 12 weeks with an Estimated Date of Delivery: 12/11/22 being seen today for her initial obstetrical visit.   Patient's last menstrual period was 03/06/2022 (approximate). Her obstetrical history is significant for  1st: C/S for NRFHR, 2nd: RCS d/t failed TOLAC/IOL for pre-e, SAB x 1 . Had pp HTN after 2nd baby for few months, then resolved, currently off meds.    Today she reports no complaints. Lost 50lbs w/ Ozempic prior to pregnancy, stopped med in June. Last pap 08/02/20. Results were:  NILM w/ no mention of HPV (done in [place])     05/29/2022    9:48 AM 02/04/2021    8:53 AM 11/04/2020   10:21 AM 09/24/2020   12:17 PM  Depression screen PHQ 2/9  Decreased Interest 0 0 0 1  Down, Depressed, Hopeless 0 1 1 1   PHQ - 2 Score 0 1 1 2   Altered sleeping 0 1 0 1  Tired, decreased energy 2 1 2 2   Change in appetite 0 0 0 1  Feeling bad or failure about yourself  0 0 0 0  Trouble concentrating 0 0 0 1  Moving slowly or fidgety/restless 0 0 0 0  Suicidal thoughts 0 0 0 0  PHQ-9 Score 2 3 3 7         05/29/2022    9:48 AM 02/04/2021    8:53 AM 11/04/2020   10:21 AM 09/24/2020   12:18 PM  GAD 7 : Generalized Anxiety Score  Nervous, Anxious, on Edge 2 1 1 1   Control/stop worrying 1 1 1 1   Worry too much - different things 1 1 1 1   Trouble relaxing 1 1 1 1   Restless 0 0 0 0  Easily annoyed or irritable 1 1 1 1   Afraid - awful might happen 0 0 0 0  Total GAD 7 Score 6 5 5 5      Review of Systems:   Pertinent items are noted in HPI Denies cramping/contractions, leakage of fluid, vaginal bleeding, abnormal vaginal discharge w/ itching/odor/irritation, headaches, visual changes, shortness of breath, chest pain, abdominal pain, severe  nausea/vomiting, or problems with urination or bowel movements unless otherwise stated above.  Pertinent History Reviewed:  Reviewed past medical,surgical, social, obstetrical and family history.  Reviewed problem list, medications and allergies. OB History  Gravida Para Term Preterm AB Living  4 2 2   1 2   SAB IAB Ectopic Multiple Live Births  1     0 2    # Outcome Date GA Lbr Len/2nd Weight Sex Delivery Anes PTL Lv  4 Current           3 Term 04/17/21 [redacted]w[redacted]d  7 lb 4.1 oz (3.29 kg) M CS-LTranv EPI  LIV  2 SAB 2016          1 Term 11/27/11 [redacted]w[redacted]d  5 lb 4 oz (2.381 kg) F CS-LTranv EPI N LIV     Complications: Fetal Intolerance   Physical Assessment:   Vitals:   05/29/22 1008  BP: 123/73  Pulse: 81  Weight: (!) 305 lb (138.3 kg)  Body mass index is 46.38 kg/m.       Physical Examination:  General appearance - well appearing, and in no distress  Mental status - alert, oriented to person, place, and time  Psych:  She has a normal mood and affect  Skin - warm and dry, normal color, no suspicious lesions noted  Chest - effort normal, all lung fields clear to auscultation bilaterally  Heart - normal rate and regular rhythm  Abdomen - soft, nontender  Extremities:  No swelling or varicosities noted  Thin prep pap is not done   Chaperone: N/A    TODAY'S NT [place] 12 wks,single IUP,CRL 60.75 mm,NT 1.3 mm,NB present,FHR 129 bpm,posterior placenta,normal ovaries,limited view because of pt body habitus   No results found for this or any previous visit (from the past 24 hour(s)).  Assessment & Plan:  1) Low-Risk Pregnancy [id number] at [redacted]w[redacted]d with an Estimated Date of Delivery: 12/11/22   2) Initial OB visit  3) AMA 36yo  4) H/O pre-e and PPHTN> ASA 162mg , baseline labs  5) Prev c/s x 2> for RCS  6) BMI 46> lost 50lbs w/ Ozempic prior to pregnancy, discussed healthy eating/staying active  Meds: No orders of the defined types were placed in this encounter.   Initial labs  obtained Continue prenatal vitamins Reviewed n/v relief measures and warning s/s to report Reviewed recommended weight gain based on pre-gravid BMI Encouraged well-balanced diet Genetic & carrier screening discussed: requests Panorama and NT/IT, neg Horizon prior pregnancy Ultrasound discussed; fetal survey: requested CCNC completed> form faxed if has or is planning to apply for medicaid The nature of [facility] for with multiple MDs and other Advanced Practice Providers was explained to patient; also emphasized that fellows, residents, and students are part of our team. Does have home bp cuff. Office bp cuff given: no. Rx sent: no. Check bp weekly, let [employer] know if consistently >140/90.   Follow-up: Return in about 4 weeks (around 06/26/2022) for LROB, CNM, in person; then 8wks from now for anatomy u/s and LROB w/ CNM.   Orders Placed This Encounter  Procedures   Urine Culture   Integrated 1   CBC/D/Plt+RPR+Rh+ABO+RubIgG...   PANORAMA PRENATAL TEST FULL PANEL   Protein / creatinine ratio, urine   Comprehensive metabolic panel   Hemoglobin A1c    08/25/2022 CNM, [facility] 05/29/2022 10:55 AM

## 2022-05-29 NOTE — Progress Notes (Signed)
[place] 12 wks,single IUP,CRL 60.75 mm,NT 1.3 mm,NB present,FHR 129 bpm,posterior placenta,normal ovaries,EDD by LMP 12/11/2022

## 2022-05-30 MED ORDER — FERROUS SULFATE 325 (65 FE) MG PO TABS: 325.0000 mg | ORAL_TABLET | ORAL | 2 refills | Status: DC

## 2022-05-30 NOTE — Addendum Note (Signed)
Addended by: Shawna Clamp R on: 05/30/2022 11:26 AM   Modules accepted: Orders

## 2022-05-31 LAB — CBC/D/PLT+RPR+RH+ABO+RUBIGG...
Antibody Screen: NEGATIVE
Basophils Absolute: 0 10*3/uL (ref 0.0–0.2)
Basos: 0 %
EOS (ABSOLUTE): 0.2 10*3/uL (ref 0.0–0.4)
Eos: 2 %
HCV Ab: NONREACTIVE
HIV Screen 4th Generation wRfx: NONREACTIVE
Hematocrit: 33.5 % — ABNORMAL LOW (ref 34.0–46.6)
Hemoglobin: 10.3 g/dL — ABNORMAL LOW (ref 11.1–15.9)
Hepatitis B Surface Ag: NEGATIVE
Immature Grans (Abs): 0 10*3/uL (ref 0.0–0.1)
Immature Granulocytes: 0 %
Lymphocytes Absolute: 2 10*3/uL (ref 0.7–3.1)
Lymphs: 22 %
MCH: 23.8 pg — ABNORMAL LOW (ref 26.6–33.0)
MCHC: 30.7 g/dL — ABNORMAL LOW (ref 31.5–35.7)
MCV: 77 fL — ABNORMAL LOW (ref 79–97)
Monocytes Absolute: 0.4 10*3/uL (ref 0.1–0.9)
Monocytes: 5 %
Neutrophils Absolute: 6.3 10*3/uL (ref 1.4–7.0)
Neutrophils: 71 %
Platelets: 300 10*3/uL (ref 150–450)
RBC: 4.33 x10E6/uL (ref 3.77–5.28)
RDW: 13.6 % (ref 11.7–15.4)
RPR Ser Ql: NONREACTIVE
Rh Factor: POSITIVE
Rubella Antibodies, IGG: 2.97 index (ref >0.99)
WBC: 9 10*3/uL (ref 3.4–10.8)

## 2022-05-31 LAB — COMPREHENSIVE METABOLIC PANEL
ALT: 11 IU/L (ref 0–32)
AST: 12 IU/L (ref 0–40)
Albumin/Globulin Ratio: 1.5 (ref 1.2–2.2)
Albumin: 4.1 g/dL (ref 3.9–4.9)
Alkaline Phosphatase: 122 IU/L — ABNORMAL HIGH (ref 44–121)
BUN/Creatinine Ratio: 20 (ref 9–23)
BUN: 8 mg/dL (ref 6–20)
Bilirubin Total: <0.2 mg/dL (ref 0.0–1.2)
CO2: 21 mmol/L (ref 20–29)
Calcium: 8.9 mg/dL (ref 8.7–10.2)
Chloride: 101 mmol/L (ref 96–106)
Creatinine, Ser: 0.41 mg/dL — ABNORMAL LOW (ref 0.57–1.00)
Globulin, Total: 2.7 g/dL (ref 1.5–4.5)
Glucose: 81 mg/dL (ref 70–99)
Potassium: 4.1 mmol/L (ref 3.5–5.2)
Sodium: 138 mmol/L (ref 134–144)
Total Protein: 6.8 g/dL (ref 6.0–8.5)
eGFR: 131 mL/min/{1.73_m2} (ref >59)

## 2022-05-31 LAB — INTEGRATED 1
Crown Rump Length: 60.8 mm
Gest. Age on Collection Date: 12.4 weeks
Maternal Age at EDD: 36 yr
Nuchal Translucency (NT): 1.3 mm
Number of Fetuses: 1
PAPP-A Value: 238.3 ng/mL
Weight: 305 [lb_av]

## 2022-05-31 LAB — PROTEIN / CREATININE RATIO, URINE
Creatinine, Urine: 105.6 mg/dL
Protein, Ur: 9.2 mg/dL
Protein/Creat Ratio: 87 mg/g creat (ref 0–200)

## 2022-05-31 LAB — URINE CULTURE

## 2022-05-31 LAB — HEMOGLOBIN A1C
Est. average glucose Bld gHb Est-mCnc: 105 mg/dL
Hgb A1c MFr Bld: 5.3 % (ref 4.8–5.6)

## 2022-05-31 LAB — HCV INTERPRETATION

## 2022-06-04 LAB — PANORAMA PRENATAL TEST FULL PANEL:PANORAMA TEST PLUS 5 ADDITIONAL MICRODELETIONS: FETAL FRACTION: 3.7

## 2022-06-28 ENCOUNTER — Encounter: Payer: Self-pay | Admitting: Family Medicine

## 2022-06-28 ENCOUNTER — Ambulatory Visit (INDEPENDENT_AMBULATORY_CARE_PROVIDER_SITE_OTHER): Payer: Medicaid Other | Admitting: Family Medicine

## 2022-06-28 VITALS — BP 138/87 | HR 96 | Wt 312.0 lb

## 2022-06-28 DIAGNOSIS — Z3480 Encounter for supervision of other normal pregnancy, unspecified trimester: Secondary | ICD-10-CM

## 2022-06-28 DIAGNOSIS — O09299 Supervision of pregnancy with other poor reproductive or obstetric history, unspecified trimester: Secondary | ICD-10-CM

## 2022-06-28 DIAGNOSIS — Z1379 Encounter for other screening for genetic and chromosomal anomalies: Secondary | ICD-10-CM

## 2022-06-28 DIAGNOSIS — Z3A16 16 weeks gestation of pregnancy: Secondary | ICD-10-CM

## 2022-06-28 DIAGNOSIS — Z98891 History of uterine scar from previous surgery: Secondary | ICD-10-CM

## 2022-06-28 NOTE — Progress Notes (Signed)
   PRENATAL VISIT NOTE  Subjective:  Summer Terrell is a 37 y.o. [phone] at [redacted]w[redacted]d being seen today for ongoing prenatal care.  She is currently monitored for the following issues for this high-risk pregnancy and has Varicose veins of left lower extremity with complications; S/P cesarean section; Hx of preeclampsia, prior pregnancy, currently pregnant; Depression with anxiety; Dental infection; Diffuse cellulitis of face; and Encounter for supervision of normal pregnancy, antepartum on their problem list.  Patient reports no complaints.  Contractions: Not present. Vag. Bleeding: None.  Movement: Present. Denies leaking of fluid.   The following portions of the patient's history were reviewed and updated as appropriate: allergies, current medications, past family history, past medical history, past social history, past surgical history and problem list.   Objective:   Vitals:   06/28/22 1109  BP: 138/87  Pulse: 96  Weight: (!) 312 lb (141.5 kg)    Fetal Status: Fetal Heart Rate (bpm): 151   Movement: Present     Pt informed that the ultrasound is considered a limited OB ultrasound and is not intended to be a complete ultrasound exam.  Patient also informed that the ultrasound is not being completed with the intent of assessing for fetal or placental anomalies or any pelvic abnormalities.  Explained that the purpose of today's ultrasound is to assess for   fetal HR .  Patient acknowledges the purpose of the exam and the limitations of the study.    General:  Alert, oriented and cooperative. Patient is in no acute distress.  Skin: Skin is warm and dry. No rash noted.   Cardiovascular: Normal heart rate noted  Respiratory: Normal respiratory effort, no problems with respiration noted  Abdomen: Soft, gravid, appropriate for gestational age.  Pain/Pressure: Absent     Pelvic: Cervical exam deferred        Extremities: Normal range of motion.     Mental Status: Normal mood and affect. Normal  behavior. Normal judgment and thought content.   Assessment and Plan:  Pregnancy: [id number] at [redacted]w[redacted]d 1. Supervision of other normal pregnancy, antepartum No acute concerns.   2. [redacted] weeks gestation of pregnancy Follow up in 4 weeks or sooner if needed  3. Encounter for genetic screening Nml NT reviewed - INTEGRATED 2  4. Hx of preeclampsia, prior pregnancy, currently pregnant BP elevated today. Signed up for BBRx. Encouraged to log BPs. PreE precautions given.   5. History of C-section C/S x2 and considering BTL. Will need to continue to discuss at subsequent visits and obtain consent if patient desires.   Preterm labor symptoms and general obstetric precautions including but not limited to vaginal bleeding, contractions, leaking of fluid and fetal movement were reviewed in detail with the patient. Please refer to After Visit Summary for other counseling recommendations.   Return in about 4 weeks (around 07/26/2022) for w/ MD/DO to discuss possible tubal.  Future Appointments  Date Time Provider [place]  07/26/2022  1:30 PM [facility] [place] [facility] None  07/26/2022  2:30 PM Janyth Pupa, DO [facility]    Merna Baldi Autry-Lott, DO

## 2022-06-30 LAB — INTEGRATED 2
AFP MoM: 1.1
Alpha-Fetoprotein: 19.1 ng/mL
Crown Rump Length: 60.8 mm
DIA MoM: 0.88
DIA Value: 90.1 pg/mL
Estriol, Unconjugated: 0.6 ng/mL
Gest. Age on Collection Date: 12.4 weeks
Gestational Age: 16.7 weeks
Maternal Age at EDD: 36 yr
Nuchal Translucency (NT): 1.3 mm
Nuchal Translucency MoM: 0.96
Number of Fetuses: 1
PAPP-A MoM: 0.6
PAPP-A Value: 238.3 ng/mL
Test Results:: NEGATIVE
Weight: 305 [lb_av]
Weight: 312 [lb_av]
hCG MoM: 3.01
hCG Value: 51.8 IU/mL
uE3 MoM: 0.71

## 2022-07-25 ENCOUNTER — Other Ambulatory Visit: Payer: Self-pay | Admitting: Women's Health

## 2022-07-25 DIAGNOSIS — Z363 Encounter for antenatal screening for malformations: Secondary | ICD-10-CM

## 2022-07-26 ENCOUNTER — Ambulatory Visit (INDEPENDENT_AMBULATORY_CARE_PROVIDER_SITE_OTHER): Payer: Medicaid Other | Admitting: Obstetrics & Gynecology

## 2022-07-26 ENCOUNTER — Encounter: Payer: Self-pay | Admitting: Obstetrics & Gynecology

## 2022-07-26 ENCOUNTER — Ambulatory Visit (INDEPENDENT_AMBULATORY_CARE_PROVIDER_SITE_OTHER): Payer: Medicaid Other

## 2022-07-26 VITALS — BP 122/75 | HR 83 | Wt 318.4 lb

## 2022-07-26 DIAGNOSIS — Z363 Encounter for antenatal screening for malformations: Secondary | ICD-10-CM

## 2022-07-26 DIAGNOSIS — Z3480 Encounter for supervision of other normal pregnancy, unspecified trimester: Secondary | ICD-10-CM

## 2022-07-26 DIAGNOSIS — Z3A20 20 weeks gestation of pregnancy: Secondary | ICD-10-CM | POA: Diagnosis not present

## 2022-07-26 DIAGNOSIS — Z98891 History of uterine scar from previous surgery: Secondary | ICD-10-CM

## 2022-07-26 DIAGNOSIS — Z3009 Encounter for other general counseling and advice on contraception: Secondary | ICD-10-CM

## 2022-07-26 DIAGNOSIS — Z3482 Encounter for supervision of other normal pregnancy, second trimester: Secondary | ICD-10-CM

## 2022-07-26 NOTE — Progress Notes (Signed)
LOW-RISK PREGNANCY VISIT Patient name: Summer Terrell MRN [id number]  Date of birth: 1985-12-06 Chief Complaint:   Routine Prenatal Visit  History of Present Illness:   Summer Terrell is a 37 y.o. [phone] female at [redacted]w[redacted]d with an Estimated Date of Delivery: 12/11/22 being seen today for ongoing management of a low-risk pregnancy.   -Prior C-section x 2, plan for repeat -h/o ppHTN- on ASA 162mg  daily     05/29/2022    9:48 AM 02/04/2021    8:53 AM 11/04/2020   10:21 AM 09/24/2020   12:17 PM  Depression screen PHQ 2/9  Decreased Interest 0 0 0 1  Down, Depressed, Hopeless 0 1 1 1   PHQ - 2 Score 0 1 1 2   Altered sleeping 0 1 0 1  Tired, decreased energy 2 1 2 2   Change in appetite 0 0 0 1  Feeling bad or failure about yourself  0 0 0 0  Trouble concentrating 0 0 0 1  Moving slowly or fidgety/restless 0 0 0 0  Suicidal thoughts 0 0 0 0  PHQ-9 Score 2 3 3 7     Today she reports  some LE swelling . Contractions: Not present. Vag. Bleeding: None.  Movement: Present. denies leaking of fluid. Review of Systems:   Pertinent items are noted in HPI Denies abnormal vaginal discharge w/ itching/odor/irritation, headaches, visual changes, shortness of breath, chest pain, abdominal pain, severe nausea/vomiting, or problems with urination or bowel movements unless otherwise stated above. Pertinent History Reviewed:  Reviewed past medical,surgical, social, obstetrical and family history.  Reviewed problem list, medications and allergies.  Physical Assessment:   Vitals:   07/26/22 1502  BP: 122/75  Pulse: 83  Weight: (!) 318 lb 6.4 oz (144.4 kg)  Body mass index is 48.41 kg/m.        Physical Examination:   General appearance: Well appearing, and in no distress  Mental status: Alert, oriented to person, place, and time  Skin: Warm & dry  Respiratory: Normal respiratory effort, no distress  Abdomen: Soft, gravid, nontender  Pelvic: Cervical exam deferred         Extremities:     Psych:  mood and affect appropriate  Fetal Status:     Movement: Present    breech,posterior placenta gr 0,cx length 4 cm,SVP of fluid 4.7 cm,FHR 154 bpm,normal ovaries,EFW 352 g 52%,anatomy complete,limited view,please have pt come back for f/u ultrasound   Chaperone: n/a    No results found for this or any previous visit (from the past 24 hour(s)).   Assessment & Plan:  1) Low-risk pregnancy [id number] at [redacted]w[redacted]d with an Estimated Date of Delivery: 12/11/22   2) Prior C-section x 2,  discussed BTL via salpingectomy  Patient desires permanent sterilization.  Other reversible forms of contraception were discussed with patient; she declines all other modalities. Risks of procedure discussed with patient including but not limited to: risk of regret, permanence of method, bleeding, infection, injury to surrounding organs and need for additional procedures.  Consent obtained  Reviewed today's anatomy scan- incomplete views, plan for f/u in 4wks    Meds: No orders of the defined types were placed in this encounter.  Labs/procedures today: anatomy scan  Plan:  Continue routine obstetrical care  Next visit: prefers in person    Reviewed: Preterm labor symptoms and general obstetric precautions including but not limited to vaginal bleeding, contractions, leaking of fluid and fetal movement were reviewed in detail with the patient.  All questions were  answered. Pt has home bp cuff. Check bp weekly, let us know if >140/90.   Follow-up: Return in about 4 weeks (around 08/23/2022) for [place] visit and follow up anatomy scan.  No orders of the defined types were placed in this encounter.   Janyth Pupa, DO Attending [place], [facility] for [employer], [place]

## 2022-07-26 NOTE — Progress Notes (Signed)
[place] 20+2 wks,breech,posterior placenta gr 0,cx length 4 cm,SVP of fluid 4.7 cm,FHR 154 bpm,normal ovaries,EFW 352 g 52%,anatomy complete,limited view,please have pt come back for f/u ultrasound

## 2022-08-01 ENCOUNTER — Encounter: Payer: Self-pay | Admitting: Obstetrics & Gynecology

## 2022-08-03 ENCOUNTER — Other Ambulatory Visit (INDEPENDENT_AMBULATORY_CARE_PROVIDER_SITE_OTHER): Payer: Medicaid Other | Admitting: *Deleted

## 2022-08-03 DIAGNOSIS — R300 Dysuria: Secondary | ICD-10-CM

## 2022-08-03 LAB — POCT URINALYSIS DIPSTICK OB
Blood, UA: NEGATIVE
Glucose, UA: NEGATIVE
Ketones, UA: NEGATIVE
Leukocytes, UA: NEGATIVE
Nitrite, UA: NEGATIVE

## 2022-08-03 NOTE — Progress Notes (Signed)
   NURSE VISIT- UTI SYMPTOMS   SUBJECTIVE:  Summer Terrell is a 37 y.o. [phone] female here for UTI symptoms. She is [redacted]w[redacted]d pregnant. She reports urinary urgency and odor .  OBJECTIVE:  LMP 03/06/2022 (Approximate)   Breastfeeding No   Appears well, in no apparent distress  Results for orders placed or performed in visit on 08/03/22 (from the past 24 hour(s))  POC Urinalysis Dipstick OB   Collection Time: 08/03/22  2:38 PM  Result Value Ref Range   Color, UA     Clarity, UA     Glucose, UA Negative Negative   Bilirubin, UA     Ketones, UA neg    Spec Grav, UA     Blood, UA neg    pH, UA     POC,PROTEIN,UA Trace Negative, Trace, Small (1+), Moderate (2+), Large (3+), 4+   Urobilinogen, UA     Nitrite, UA neg    Leukocytes, UA Negative Negative   Appearance     Odor      ASSESSMENT: Pregnancy [redacted]w[redacted]d with UTI symptoms and negative nitrites  PLAN: Note routed to Dr. Elonda Husky   Rx sent by provider today: No Urine culture sent Call or return to clinic prn if these symptoms worsen or fail to improve as anticipated. Follow-up: as scheduled   [name]  08/03/2022 2:39 PM

## 2022-08-05 LAB — URINE CULTURE

## 2022-08-22 ENCOUNTER — Other Ambulatory Visit: Payer: Self-pay | Admitting: Obstetrics & Gynecology

## 2022-08-22 DIAGNOSIS — Z362 Encounter for other antenatal screening follow-up: Secondary | ICD-10-CM

## 2022-08-23 ENCOUNTER — Ambulatory Visit (INDEPENDENT_AMBULATORY_CARE_PROVIDER_SITE_OTHER): Payer: Medicaid Other | Admitting: Advanced Practice Midwife

## 2022-08-23 ENCOUNTER — Encounter: Payer: Self-pay | Admitting: Advanced Practice Midwife

## 2022-08-23 ENCOUNTER — Ambulatory Visit (INDEPENDENT_AMBULATORY_CARE_PROVIDER_SITE_OTHER): Payer: Medicaid Other

## 2022-08-23 VITALS — BP 133/74 | HR 96 | Wt 328.0 lb

## 2022-08-23 DIAGNOSIS — Z3480 Encounter for supervision of other normal pregnancy, unspecified trimester: Secondary | ICD-10-CM

## 2022-08-23 DIAGNOSIS — Z3A24 24 weeks gestation of pregnancy: Secondary | ICD-10-CM

## 2022-08-23 DIAGNOSIS — O99212 Obesity complicating pregnancy, second trimester: Secondary | ICD-10-CM

## 2022-08-23 DIAGNOSIS — F418 Other specified anxiety disorders: Secondary | ICD-10-CM

## 2022-08-23 DIAGNOSIS — O99210 Obesity complicating pregnancy, unspecified trimester: Secondary | ICD-10-CM

## 2022-08-23 DIAGNOSIS — Z3482 Encounter for supervision of other normal pregnancy, second trimester: Secondary | ICD-10-CM

## 2022-08-23 DIAGNOSIS — Z362 Encounter for other antenatal screening follow-up: Secondary | ICD-10-CM

## 2022-08-23 NOTE — Progress Notes (Signed)
   LOW-RISK PREGNANCY VISIT Patient name: Summer Terrell MRN [id number]  Date of birth: 29-Sep-1985 Chief Complaint:   Routine Prenatal Visit  History of Present Illness:   Summer Terrell is a 37 y.o. [phone] female at 59w2dwith an Estimated Date of Delivery: 12/11/22 being seen today for ongoing management of a low-risk pregnancy.  Today she reports no complaints. Contractions: Not present. Vag. Bleeding: None.  Movement: Present. denies leaking of fluid. Review of Systems:   Pertinent items are noted in HPI Denies abnormal vaginal discharge w/ itching/odor/irritation, headaches, visual changes, shortness of breath, chest pain, abdominal pain, severe nausea/vomiting, or problems with urination or bowel movements unless otherwise stated above. Pertinent History Reviewed:  Reviewed past medical,surgical, social, obstetrical and family history.  Reviewed problem list, medications and allergies. Physical Assessment:   Vitals:   08/23/22 1501  BP: 133/74  Pulse: 96  Weight: (!) 328 lb (148.8 kg)  Body mass index is 49.87 kg/m.        Physical Examination:   General appearance: Well appearing, and in no distress  Mental status: Alert, oriented to person, place, and time  Skin: Warm & dry  Cardiovascular: Normal heart rate noted  Respiratory: Normal respiratory effort, no distress  Abdomen: Soft, gravid, nontender  Pelvic: Cervical exam deferred         Extremities:    Fetal Status: Fetal Heart Rate (bpm): 161 u/s   Movement: Present    F/U anatomy scan/growth: [id number],cephalic,posterior placenta gr 0,cx 3.7 cm,SVP 5.9 cm,EFW 825 g 91%,anatomy complete,limited view because of pt body habitus   No results found for this or any previous visit (from the past 24 hour(s)).  Assessment & Plan:  1) Low-risk pregnancy [id number] 280w2dith an Estimated Date of Delivery: 12/11/22   2) Prev C/S x 2, plan for repeat w BTL  3) Hx pre-e/ppHTN, taking bASA, stable BPs currently  4)  Obesity in preg, EFW 91% today; growth order in for 28/32/36 (last 2 not scheduled yet)   Meds: No orders of the defined types were placed in this encounter.  Labs/procedures today: f/u anatomy/growth u/s  Plan:  Continue routine obstetrical care   Reviewed: Preterm labor symptoms and general obstetric precautions including but not limited to vaginal bleeding, contractions, leaking of fluid and fetal movement were reviewed in detail with the patient.  All questions were answered. Has home bp cuff. Check bp weekly, let usKoreanow if >140/90.   Follow-up: Return in about 4 weeks (around 09/20/2022) for LROB, PN2, US[place]oreaEFW.  Orders Placed This Encounter  Procedures   US[place]oreaB Follow Up   [facility]/28/2024 3:23 PM

## 2022-08-23 NOTE — Patient Instructions (Signed)
Elenor Legato, I greatly value your feedback.  If you receive a survey following your visit with [place] today, we appreciate you taking the time to fill it out.  Thanks, Derrill Memo, CNM   You will have your sugar test next visit.  Please do not eat or drink anything after midnight the night before you come, not even water.  You will be here for at least two hours.  Please make an appointment online for the bloodwork at [email or web address] for 8:30am (or as close to this as possible). Make sure you select the [facility] service center. The day of the appointment, check in with our office first, then you will go to [place] to start the sugar test.    [place]!!! It is now [place] [facility] ([place], [place] [address]) Entrance C, located off of [address] parking  Go to [email or web address] to register for FREE online childbirth classes   Call the office [phone]) or go to [facility] if: You begin to have strong, frequent contractions Your water breaks.  Sometimes it is a big gush of fluid, sometimes it is just a trickle that keeps getting your panties wet or running down your legs You have vaginal bleeding.  It is normal to have a small amount of spotting if your cervix was checked.  You don't feel your baby moving like normal.  If you don't, get you something to eat and drink and lay down and focus on feeling your baby move.   If your baby is still not moving like normal, you should call the office or go to [place] Pediatricians/Family Doctors: [place] [phone]                [place] [phone] (usually not accepting new patients unless you have family there already, you are always welcome to call and ask)      [facility] Department [phone]       [facility] Pediatricians/Family Doctors:  [facility]: [phone] [facility]: [phone] [facility]: [place] Doctors:  [facility]: [place] [facility]: [place]: [place]: [phone]   Home Blood Pressure Monitoring for Patients   Your provider has recommended that you check your blood pressure (BP) at least once a week at home. If you do not have a blood pressure cuff at home, one will be provided for you. Contact your provider if you have not received your monitor within 1 week.   Helpful Tips for Accurate Home Blood Pressure Checks  Don't smoke, exercise, or drink caffeine 30 minutes before checking your BP Use the restroom before checking your BP (a full bladder can raise your pressure) Relax in a comfortable upright chair Feet on the ground Left arm resting comfortably on a flat surface at the level of your heart Legs uncrossed Back supported Sit quietly and don't talk Place the cuff on your bare arm Adjust snuggly, so that only two fingertips can fit between your skin and the top of the cuff Check 2 readings separated by at least one minute Keep a log of your BP readings For a visual, please reference this diagram: http://ccnc.care/bpdiagram  Provider Name: [facility]     Phone: [phone]  Zone 1: ALL CLEAR  Continue to monitor your symptoms:  BP reading is less than 140 (top number) or less than 90 (bottom number)  No right upper stomach pain No headaches or seeing spots No feeling nauseated or throwing up No swelling in face and hands  Zone 2: CAUTION Call your doctor's office for any of the following:  BP reading is greater than 140 (top number) or greater than 90 (bottom number)  Stomach pain under your ribs in the middle or right side Headaches or seeing spots Feeling nauseated or throwing up Swelling in face and hands  Zone 3: EMERGENCY  Seek  immediate medical care if you have any of the following:  BP reading is greater than160 (top number) or greater than 110 (bottom number) Severe headaches not improving with Tylenol Serious difficulty catching your breath Any worsening symptoms from Zone 2   Second Trimester of Pregnancy The second trimester is from week 13 through week 28, months 4 through 6. The second trimester is often a time when you feel your best. Your body has also adjusted to being pregnant, and you begin to feel better physically. Usually, morning sickness has lessened or quit completely, you may have more energy, and you may have an increase in appetite. The second trimester is also a time when the fetus is growing rapidly. At the end of the sixth month, the fetus is about 9 inches long and weighs about 1 pounds. You will likely begin to feel the baby move (quickening) between 18 and 20 weeks of the pregnancy. BODY CHANGES Your body goes through many changes during pregnancy. The changes vary from woman to woman.  Your weight will continue to increase. You will notice your lower abdomen bulging out. You may begin to get stretch marks on your hips, abdomen, and breasts. You may develop headaches that can be relieved by medicines approved by your health care provider. You may urinate more often because the fetus is pressing on your bladder. You may develop or continue to have heartburn as a result of your pregnancy. You may develop constipation because certain hormones are causing the muscles that push waste through your intestines to slow down. You may develop hemorrhoids or swollen, bulging veins (varicose veins). You may have back pain because of the weight gain and pregnancy hormones relaxing your joints between the bones in your pelvis and as a result of a shift in weight and the muscles that support your balance. Your breasts will continue to grow and be tender. Your gums may bleed and may be sensitive to brushing  and flossing. Dark spots or blotches (chloasma, mask of pregnancy) may develop on your face. This will likely fade after the baby is born. A dark line from your belly button to the pubic area (linea nigra) may appear. This will likely fade after the baby is born. You may have changes in your hair. These can include thickening of your hair, rapid growth, and changes in texture. Some women also have hair loss during or after pregnancy, or hair that feels dry or thin. Your hair will most likely return to normal after your baby is born. WHAT TO EXPECT AT YOUR PRENATAL VISITS During a routine prenatal visit: You will be weighed to make sure you and the fetus are growing normally. Your blood pressure will be taken. Your abdomen will be measured to track your baby's growth. The fetal heartbeat will be listened to. Any test results from the previous visit will be discussed. Your health care provider  may ask you: How you are feeling. If you are feeling the baby move. If you have had any abnormal symptoms, such as leaking fluid, bleeding, severe headaches, or abdominal cramping. If you have any questions. Other tests that may be performed during your second trimester include: Blood tests that check for: Low iron levels (anemia). Gestational diabetes (between 24 and 28 weeks). Rh antibodies. Urine tests to check for infections, diabetes, or protein in the urine. An ultrasound to confirm the proper growth and development of the baby. An amniocentesis to check for possible genetic problems. Fetal screens for spina bifida and Down syndrome. HOME CARE INSTRUCTIONS  Avoid all smoking, herbs, alcohol, and unprescribed drugs. These chemicals affect the formation and growth of the baby. Follow your health care provider's instructions regarding medicine use. There are medicines that are either safe or unsafe to take during pregnancy. Exercise only as directed by your health care provider. Experiencing  uterine cramps is a good sign to stop exercising. Continue to eat regular, healthy meals. Wear a good support bra for breast tenderness. Do not use hot tubs, steam rooms, or saunas. Wear your seat belt at all times when driving. Avoid raw meat, uncooked cheese, cat litter boxes, and soil used by cats. These carry germs that can cause birth defects in the baby. Take your prenatal vitamins. Try taking a stool softener (if your health care provider approves) if you develop constipation. Eat more high-fiber foods, such as fresh vegetables or fruit and whole grains. Drink plenty of fluids to keep your urine clear or pale yellow. Take warm sitz baths to soothe any pain or discomfort caused by hemorrhoids. Use hemorrhoid cream if your health care provider approves. If you develop varicose veins, wear support hose. Elevate your feet for 15 minutes, 3-4 times a day. Limit salt in your diet. Avoid heavy lifting, wear low heel shoes, and practice good posture. Rest with your legs elevated if you have leg cramps or low back pain. Visit your dentist if you have not gone yet during your pregnancy. Use a soft toothbrush to brush your teeth and be gentle when you floss. A sexual relationship may be continued unless your health care provider directs you otherwise. Continue to go to all your prenatal visits as directed by your health care provider. SEEK MEDICAL CARE IF:  You have dizziness. You have mild pelvic cramps, pelvic pressure, or nagging pain in the abdominal area. You have persistent nausea, vomiting, or diarrhea. You have a bad smelling vaginal discharge. You have pain with urination. SEEK IMMEDIATE MEDICAL CARE IF:  You have a fever. You are leaking fluid from your vagina. You have spotting or bleeding from your vagina. You have severe abdominal cramping or pain. You have rapid weight gain or loss. You have shortness of breath with chest pain. You notice sudden or extreme swelling of your face,  hands, ankles, feet, or legs. You have not felt your baby move in over an hour. You have severe headaches that do not go away with medicine. You have vision changes. Document Released: 06/06/2001 Document Revised: 06/17/2013 Document Reviewed: 08/13/2012 [facility] Patient Information 2015 [place], [place]. This information is not intended to replace advice given to you by your health care provider. Make sure you discuss any questions you have with your health care provider.

## 2022-08-23 NOTE — Progress Notes (Signed)
[place] [id number] wks,cephalic,posterior placenta gr 0,cx 3.7 cm,SVP 5.9 cm,EFW 825 g 91%,anatomy complete,limited view because of pt body habitus

## 2022-09-12 ENCOUNTER — Encounter: Payer: Self-pay | Admitting: Family Medicine

## 2022-09-14 ENCOUNTER — Ambulatory Visit (INDEPENDENT_AMBULATORY_CARE_PROVIDER_SITE_OTHER): Payer: Medicaid Other | Admitting: *Deleted

## 2022-09-14 ENCOUNTER — Telehealth: Payer: Self-pay | Admitting: *Deleted

## 2022-09-14 ENCOUNTER — Encounter: Payer: Self-pay | Admitting: Obstetrics & Gynecology

## 2022-09-14 ENCOUNTER — Telehealth: Payer: Self-pay

## 2022-09-14 VITALS — BP 128/75 | HR 102

## 2022-09-14 DIAGNOSIS — Z3480 Encounter for supervision of other normal pregnancy, unspecified trimester: Secondary | ICD-10-CM

## 2022-09-14 DIAGNOSIS — R030 Elevated blood-pressure reading, without diagnosis of hypertension: Secondary | ICD-10-CM

## 2022-09-14 LAB — POCT URINALYSIS DIPSTICK OB
Blood, UA: NEGATIVE
Glucose, UA: NEGATIVE
Ketones, UA: NEGATIVE
Leukocytes, UA: NEGATIVE
Nitrite, UA: NEGATIVE
POC,PROTEIN,UA: NEGATIVE

## 2022-09-14 MED ORDER — BLOOD PRESSURE MONITOR MISC: 0 refills | Status: DC

## 2022-09-14 NOTE — Progress Notes (Addendum)
   NURSE VISIT- BLOOD PRESSURE CHECK  SUBJECTIVE:  Summer Terrell is a 37 y.o. [phone] female here for BP check. She is [redacted]w[redacted]d pregnant    HYPERTENSION ROS:  Pregnant:  Severe headaches that don't go away with tylenol/other medicines: No  Visual changes (seeing spots/double/blurred vision) No  Severe pain under right breast breast or in center of upper chest No  Severe nausea/vomiting No  Taking medicines as instructed not applicable    OBJECTIVE:  BP 128/75   Pulse (!) 102   LMP 03/06/2022 (Approximate)   Appearance alert, well appearing, and in no distress and oriented to person, place, and time.  ASSESSMENT: Pregnancy 100w3d  blood pressure check  PLAN: Discussed with Nigel Berthold, CNM   Recommendations: check pre-e labs today  Rx a BP cuff for home use (had been using BP cuff at work).  Bring to appt to check size (large upper arms, borderline fit for large cuff--small cuff seems to work well on wrist).  Follow-up: as scheduled   Alice Rieger  09/14/2022 3:13 PM  Chart reviewed for nurse visit. Agree with plan of care.  Christin Fudge, [place] 09/14/2022 11:50 PM

## 2022-09-14 NOTE — Telephone Encounter (Signed)
Pt's BP now is 160/97. Pt to come in for BP check with nurse; appt @ 2:50 pm. JSY

## 2022-09-14 NOTE — Telephone Encounter (Signed)
Patient called and stated that her BP is up and she wants to speak with someone. Pt states that she sent a Mychart message earlier and no one has called her back.

## 2022-09-14 NOTE — Telephone Encounter (Signed)
Summer Terrell spoke with patient.

## 2022-09-15 ENCOUNTER — Encounter: Payer: Self-pay | Admitting: Advanced Practice Midwife

## 2022-09-17 ENCOUNTER — Telehealth: Payer: Self-pay | Admitting: Obstetrics & Gynecology

## 2022-09-17 LAB — COMPREHENSIVE METABOLIC PANEL
ALT: 6 IU/L (ref 0–32)
AST: 8 IU/L (ref 0–40)
Albumin/Globulin Ratio: 1.2 (ref 1.2–2.2)
Albumin: 3.3 g/dL — ABNORMAL LOW (ref 3.9–4.9)
Alkaline Phosphatase: 142 IU/L — ABNORMAL HIGH (ref 44–121)
BUN/Creatinine Ratio: 15 (ref 9–23)
BUN: 6 mg/dL (ref 6–20)
Bilirubin Total: <0.2 mg/dL (ref 0.0–1.2)
CO2: 20 mmol/L (ref 20–29)
Calcium: 8.8 mg/dL (ref 8.7–10.2)
Chloride: 102 mmol/L (ref 96–106)
Creatinine, Ser: 0.39 mg/dL — ABNORMAL LOW (ref 0.57–1.00)
Globulin, Total: 2.7 g/dL (ref 1.5–4.5)
Glucose: 120 mg/dL — ABNORMAL HIGH (ref 70–99)
Potassium: 3.9 mmol/L (ref 3.5–5.2)
Sodium: 137 mmol/L (ref 134–144)
Total Protein: 6 g/dL (ref 6.0–8.5)
eGFR: 133 mL/min/{1.73_m2} (ref >59)

## 2022-09-17 LAB — CBC
Hematocrit: 30.9 % — ABNORMAL LOW (ref 34.0–46.6)
Hemoglobin: 10.2 g/dL — ABNORMAL LOW (ref 11.1–15.9)
MCH: 25.5 pg — ABNORMAL LOW (ref 26.6–33.0)
MCHC: 33 g/dL (ref 31.5–35.7)
MCV: 77 fL — ABNORMAL LOW (ref 79–97)
Platelets: 295 10*3/uL (ref 150–450)
RBC: 4 x10E6/uL (ref 3.77–5.28)
RDW: 15.2 % (ref 11.7–15.4)
WBC: 10.9 10*3/uL — ABNORMAL HIGH (ref 3.4–10.8)

## 2022-09-17 LAB — PROTEIN / CREATININE RATIO, URINE
Creatinine, Urine: 14.9 mg/dL
Protein, Ur: 11.9 mg/dL
Protein/Creat Ratio: 799 mg/g creat — ABNORMAL HIGH (ref 0–200)

## 2022-09-17 NOTE — Telephone Encounter (Signed)
Called pt back from nursing line- concerned about her lab work that said "799."  To review, pt had elevated BP at work.  Came in to office, BP was normal (128/75) and repeat lab work was completed.  Pt notes considerable anxiety and states she can get herself "worked up" over her blood pressure. Lab work came back today: PC ratio now increased to 7.9.  BPs at home have ben ok.  Reviewed preeclampsia precautions reviewed and advised checking BP daily.  Follow up as scheduled next week.  Janyth Pupa, DO Attending [place], [facility] for [employer], [employer]

## 2022-09-20 ENCOUNTER — Encounter: Payer: Self-pay | Admitting: Obstetrics & Gynecology

## 2022-09-20 ENCOUNTER — Ambulatory Visit (INDEPENDENT_AMBULATORY_CARE_PROVIDER_SITE_OTHER): Payer: Medicaid Other | Admitting: Obstetrics & Gynecology

## 2022-09-20 ENCOUNTER — Other Ambulatory Visit (HOSPITAL_COMMUNITY)
Admission: RE | Admit: 2022-09-20 | Discharge: 2022-09-20 | Disposition: A | Discharge status: Home / Self Care | Payer: Medicaid Other | Source: Ambulatory Visit | Attending: Obstetrics & Gynecology | Admitting: Obstetrics & Gynecology

## 2022-09-20 ENCOUNTER — Ambulatory Visit (INDEPENDENT_AMBULATORY_CARE_PROVIDER_SITE_OTHER): Payer: Medicaid Other

## 2022-09-20 ENCOUNTER — Other Ambulatory Visit: Payer: Medicaid Other

## 2022-09-20 VITALS — BP 139/83 | HR 84 | Wt 332.0 lb

## 2022-09-20 DIAGNOSIS — O99213 Obesity complicating pregnancy, third trimester: Secondary | ICD-10-CM | POA: Diagnosis not present

## 2022-09-20 DIAGNOSIS — Z3A28 28 weeks gestation of pregnancy: Secondary | ICD-10-CM

## 2022-09-20 DIAGNOSIS — O1493 Unspecified pre-eclampsia, third trimester: Secondary | ICD-10-CM

## 2022-09-20 DIAGNOSIS — Z98891 History of uterine scar from previous surgery: Secondary | ICD-10-CM

## 2022-09-20 DIAGNOSIS — Z113 Encounter for screening for infections with a predominantly sexual mode of transmission: Secondary | ICD-10-CM | POA: Diagnosis not present

## 2022-09-20 DIAGNOSIS — O1490 Unspecified pre-eclampsia, unspecified trimester: Secondary | ICD-10-CM | POA: Insufficient documentation

## 2022-09-20 DIAGNOSIS — O0993 Supervision of high risk pregnancy, unspecified, third trimester: Secondary | ICD-10-CM

## 2022-09-20 DIAGNOSIS — Z131 Encounter for screening for diabetes mellitus: Secondary | ICD-10-CM

## 2022-09-20 DIAGNOSIS — Z3480 Encounter for supervision of other normal pregnancy, unspecified trimester: Secondary | ICD-10-CM

## 2022-09-20 DIAGNOSIS — O99210 Obesity complicating pregnancy, unspecified trimester: Secondary | ICD-10-CM

## 2022-09-20 DIAGNOSIS — F419 Anxiety disorder, unspecified: Secondary | ICD-10-CM

## 2022-09-20 DIAGNOSIS — O133 Gestational [pregnancy-induced] hypertension without significant proteinuria, third trimester: Secondary | ICD-10-CM

## 2022-09-20 MED ORDER — SERTRALINE HCL 50 MG PO TABS: ORAL_TABLET | ORAL | 11 refills | Status: AC

## 2022-09-20 MED ORDER — LABETALOL HCL 100 MG PO TABS: 100.0000 mg | ORAL_TABLET | Freq: Two times a day (BID) | ORAL | 6 refills | Status: DC

## 2022-09-20 MED ORDER — SERTRALINE HCL 25 MG PO TABS: ORAL_TABLET | ORAL | 0 refills | Status: DC

## 2022-09-20 NOTE — Addendum Note (Signed)
Addended by: Annalee Genta on: 09/20/2022 04:21 PM   Modules accepted: Orders

## 2022-09-20 NOTE — Progress Notes (Signed)
HIGH-RISK PREGNANCY VISIT Patient name: Summer Terrell MRN [id number]  Date of birth: 10-16-1985 Chief Complaint:   Routine Prenatal Visit  History of Present Illness:   TIEN BOUTHILLIER is a 37 y.o. [id number] female at [redacted]w[redacted]d with an Estimated Date of Delivery: 12/11/22 being seen today for ongoing management of a high-risk pregnancy complicated by:  -now diagnosed with preeclampsia- no severe features - based on PC ratio and home BP reading.  BP in office 130/80s; however, per pt BP at home 140/90s or higher  Pt denies headache, blurry vision or RUQ pain.  Pt notes considerable anxiety- no medication currently.  Previously taking Atarax but noted drowsiness with medication.  Contractions: Not present. Vag. Bleeding: None.  Movement: Present. denies leaking of fluid.      05/29/2022    9:48 AM 02/04/2021    8:53 AM 11/04/2020   10:21 AM 09/24/2020   12:17 PM  Depression screen PHQ 2/9  Decreased Interest 0 0 0 1  Down, Depressed, Hopeless 0 1 1 1   PHQ - 2 Score 0 1 1 2   Altered sleeping 0 1 0 1  Tired, decreased energy 2 1 2 2   Change in appetite 0 0 0 1  Feeling bad or failure about yourself  0 0 0 0  Trouble concentrating 0 0 0 1  Moving slowly or fidgety/restless 0 0 0 0  Suicidal thoughts 0 0 0 0  PHQ-9 Score 2 3 3 7      Current Outpatient Medications  Medication Instructions   acetaminophen (TYLENOL) 1,000 mg, Oral, Every 6 hours PRN   aspirin EC 162 mg, Oral, Daily, Swallow whole.   Blood Pressure Monitor MISC For regular home bp monitoring during pregnancy   ferrous sulfate 325 mg, Oral, Every other day   labetalol (NORMODYNE) 100 mg, Oral, 2 times daily   Prenatal Vit-Fe Fumarate-FA (PRENATAL VITAMIN PO) Oral, Gummy-1 daily   sertraline (ZOLOFT) 25 MG tablet Take 1 tablet (25 mg total) by mouth daily for 14 days, THEN 2 tablets (50 mg total) daily.     Review of Systems:   Pertinent items are noted in HPI Denies abnormal vaginal discharge w/ itching/odor/irritation,  headaches, visual changes, shortness of breath, chest pain, abdominal pain, severe nausea/vomiting, or problems with urination or bowel movements unless otherwise stated above. Pertinent History Reviewed:  Reviewed past medical,surgical, social, obstetrical and family history.  Reviewed problem list, medications and allergies. Physical Assessment:   Vitals:   09/20/22 1132  BP: 139/83  Pulse: 84  Weight: (!) 332 lb (150.6 kg)  Body mass index is 50.48 kg/m.           Physical Examination:   General appearance: no acute distress  Mental status: anxious  Skin: warm & dry   Extremities: no edema, no calf tenderness     Cardiovascular: normal heart rate noted  Respiratory: normal respiratory effort, no distress  Abdomen: obese, gravid, soft, non-tender  Pelvic: Cervical exam deferred         Fetal Status:     Movement: Present    Fetal Surveillance Testing today: cephalic,posterior placenta gr 2,FHR 169 bpm,AFI 12.7 cm,EFW 1250 g 48%,limited view because of pt body habitus    Chaperone: N/A    No results found for this or any previous visit (from the past 24 hour(s)).   Assessment & Plan:  High-risk pregnancy: [id number] at [redacted]w[redacted]d with an Estimated Date of Delivery: 12/11/22   1) PreE no severe features  -plan for weekly  blood work -continue growth 4 wks -schedule for BPP/NST twice weekly starting @ 32wks -discussed for now plan for delivery @ 37wk or may be sooner pending clinical picture  -Anxiety- long discussion with patient regarding management options.  Encouraged therapy/counseling- referral created.  Plan to start on zoloft- start on zoloft 25 x 2wks then 50mg  will then re-evaluate   -Morbid obesity -h/o C-section x 2- plan for repeat with BTL  Meds:  Meds ordered this encounter  Medications   labetalol (NORMODYNE) 100 MG tablet    Sig: Take 1 tablet (100 mg total) by mouth 2 (two) times daily.    Dispense:  60 tablet    Refill:  6   sertraline (ZOLOFT) 25 MG  tablet    Sig: Take 1 tablet (25 mg total) by mouth daily for 14 days, THEN 2 tablets (50 mg total) daily.    Dispense:  74 tablet    Refill:  0    Labs/procedures today: growth scan- AGA  Treatment Plan:  as outlined above, next visit in 2 wks  Reviewed: Preterm labor symptoms and general obstetric precautions including but not limited to vaginal bleeding, contractions, leaking of fluid and fetal movement were reviewed in detail with the patient.  All questions were answered. Pt has home bp cuff. Check bp weekly, let us know if >160/110  Follow-up: Return in about 2 weeks (around 10/04/2022) for HROB visit and start BPP/NST Twice weekly and continue growth q 4wks.   Future Appointments  Date Time Provider [place]  10/04/2022  2:50 PM Myrtis Ser, CNM [facility]    Orders Placed This Encounter  Procedures   US FETAL BPP WO NON STRESS    Janyth Pupa, DO Attending [place], [place] for [employer], [place]

## 2022-09-20 NOTE — Progress Notes (Signed)
[place] [id number] wks,cephalic,posterior placenta gr 2,FHR 169 bpm,AFI 12.7 cm,EFW 1250 g 48%,limited view because of pt body habitus

## 2022-09-21 ENCOUNTER — Encounter: Payer: Self-pay | Admitting: *Deleted

## 2022-09-21 ENCOUNTER — Encounter: Payer: Self-pay | Admitting: Obstetrics & Gynecology

## 2022-09-21 ENCOUNTER — Other Ambulatory Visit: Payer: Self-pay | Admitting: *Deleted

## 2022-09-21 DIAGNOSIS — O24419 Gestational diabetes mellitus in pregnancy, unspecified control: Secondary | ICD-10-CM | POA: Insufficient documentation

## 2022-09-21 DIAGNOSIS — Z8632 Personal history of gestational diabetes: Secondary | ICD-10-CM | POA: Insufficient documentation

## 2022-09-21 DIAGNOSIS — O24410 Gestational diabetes mellitus in pregnancy, diet controlled: Secondary | ICD-10-CM

## 2022-09-21 LAB — CERVICOVAGINAL ANCILLARY ONLY
Bacterial Vaginitis (gardnerella): NEGATIVE
Candida Glabrata: NEGATIVE
Candida Vaginitis: NEGATIVE
Chlamydia: NEGATIVE
Comment: NEGATIVE
Comment: NEGATIVE
Comment: NEGATIVE
Comment: NEGATIVE
Comment: NEGATIVE
Comment: NORMAL
Neisseria Gonorrhea: NEGATIVE
Trichomonas: NEGATIVE

## 2022-09-21 LAB — HIV ANTIBODY (ROUTINE TESTING W REFLEX): HIV Screen 4th Generation wRfx: NONREACTIVE

## 2022-09-21 LAB — GLUCOSE TOLERANCE, 2 HOURS W/ 1HR
Glucose, 1 hour: 151 mg/dL (ref 70–179)
Glucose, 2 hour: 74 mg/dL (ref 70–152)
Glucose, Fasting: 93 mg/dL — ABNORMAL HIGH (ref 70–91)

## 2022-09-21 LAB — RPR: RPR Ser Ql: NONREACTIVE

## 2022-09-21 LAB — ANTIBODY SCREEN: Antibody Screen: NEGATIVE

## 2022-09-21 MED ORDER — ACCU-CHEK SOFTCLIX LANCETS MISC: 12 refills | Status: DC

## 2022-09-21 MED ORDER — ACCU-CHEK GUIDE VI STRP: ORAL_STRIP | 12 refills | Status: DC

## 2022-09-21 MED ORDER — ACCU-CHEK GUIDE ME W/DEVICE KIT: 1.0000 | PACK | Freq: Four times a day (QID) | 0 refills | Status: DC

## 2022-10-03 ENCOUNTER — Encounter: Payer: Self-pay | Admitting: *Deleted

## 2022-10-03 ENCOUNTER — Other Ambulatory Visit: Payer: Medicaid Other

## 2022-10-04 ENCOUNTER — Ambulatory Visit (INDEPENDENT_AMBULATORY_CARE_PROVIDER_SITE_OTHER): Payer: Medicaid Other | Admitting: Obstetrics & Gynecology

## 2022-10-04 ENCOUNTER — Encounter: Payer: Self-pay | Admitting: Obstetrics & Gynecology

## 2022-10-04 ENCOUNTER — Encounter: Payer: Medicaid Other | Admitting: Advanced Practice Midwife

## 2022-10-04 VITALS — BP 145/90 | HR 80 | Wt 333.0 lb

## 2022-10-04 DIAGNOSIS — Z98891 History of uterine scar from previous surgery: Secondary | ICD-10-CM

## 2022-10-04 DIAGNOSIS — Z3A30 30 weeks gestation of pregnancy: Secondary | ICD-10-CM

## 2022-10-04 DIAGNOSIS — O1493 Unspecified pre-eclampsia, third trimester: Secondary | ICD-10-CM

## 2022-10-04 DIAGNOSIS — O24410 Gestational diabetes mellitus in pregnancy, diet controlled: Secondary | ICD-10-CM

## 2022-10-04 DIAGNOSIS — O0993 Supervision of high risk pregnancy, unspecified, third trimester: Secondary | ICD-10-CM

## 2022-10-04 MED ORDER — LABETALOL HCL 200 MG PO TABS: 200.0000 mg | ORAL_TABLET | Freq: Two times a day (BID) | ORAL | 3 refills | Status: DC

## 2022-10-04 NOTE — Progress Notes (Signed)
HIGH-RISK PREGNANCY VISIT Patient name: Summer Terrell MRN [id number]  Date of birth: [birth year] Chief Complaint:   Routine Prenatal Visit  History of Present Illness:   Summer Terrell is a 37 y.o. [id number] female at [redacted]w[redacted]d with an Estimated Date of Delivery: 12/11/22 being seen today for ongoing management of a high-risk pregnancy complicated by the following concerns:  -GestHTN/Preeclampsia- no severe features- taking Labetalol 100mg  bid BPs mostly 140/90s, occasional 150s/90s  -GDMA1- she reports that her sugars are getting low.   Fastings 60-80s and after meals-  Pt checking sugars every other day or so.  Notes she never seems anything about 150   -prior C-section x 2 plan for repeat with BTL  -anxiety- pt did not want to take medication.  Feels like she is just taking too many medications during pregnancy- feels like it's a lot.  She took zoloft postpartum with her last pregnancy and is willing to give that a try.  Today she reports no complaints.   Contractions: Not present. Vag. Bleeding: None.  Movement: Present. denies leaking of fluid.      05/29/2022    9:48 AM 02/04/2021    8:53 AM 11/04/2020   10:21 AM 09/24/2020   12:17 PM  Depression screen PHQ 2/9  Decreased Interest 0 0 0 1  Down, Depressed, Hopeless 0 1 1 1   PHQ - 2 Score 0 1 1 2   Altered sleeping 0 1 0 1  Tired, decreased energy 2 1 2 2   Change in appetite 0 0 0 1  Feeling bad or failure about yourself  0 0 0 0  Trouble concentrating 0 0 0 1  Moving slowly or fidgety/restless 0 0 0 0  Suicidal thoughts 0 0 0 0  PHQ-9 Score 2 3 3 7      Current Outpatient Medications  Medication Instructions   Accu-Chek Softclix Lancets lancets Use as instructed to check blood sugar 4 times daily   acetaminophen (TYLENOL) 1,000 mg, Oral, Every 6 hours PRN   aspirin EC 162 mg, Oral, Daily, Swallow whole.   Blood Glucose Monitoring Suppl (ACCU-CHEK GUIDE ME) w/Device KIT 1 each, Does not apply, 4 times daily   Blood  Pressure Monitor MISC For regular home bp monitoring during pregnancy   ferrous sulfate 325 mg, Oral, Every other day   glucose blood (ACCU-CHEK GUIDE) test strip Use as instructed to check blood sugar 4 times daily   labetalol (NORMODYNE) 200 mg, Oral, 2 times daily   Prenatal Vit-Fe Fumarate-FA (PRENATAL VITAMIN PO) Oral, Gummy-1 daily   sertraline (ZOLOFT) 50 MG tablet Take 1/2 tablet x 14 days then increase to 1 tablet daily     Review of Systems:   Pertinent items are noted in HPI Denies abnormal vaginal discharge w/ itching/odor/irritation, headaches, visual changes, shortness of breath, chest pain, abdominal pain, severe nausea/vomiting, or problems with urination or bowel movements unless otherwise stated above. Pertinent History Reviewed:  Reviewed past medical,surgical, social, obstetrical and family history.  Reviewed problem list, medications and allergies. Physical Assessment:   Vitals:   10/04/22 1548 10/04/22 1611  BP: (!) 143/89 (!) 145/90  Pulse: 80   Weight: (!) 333 lb (151 kg)   Body mass index is 50.63 kg/m.           Physical Examination:   General appearance: alert, well appearing, and in no distress  Mental status: anxious  Skin: warm & dry   Extremities: Edema: None    Cardiovascular: normal heart rate noted  Respiratory: normal respiratory effort, no distress  Abdomen: gravid, soft, non-tender  Pelvic: Cervical exam deferred         Fetal Status:     Movement: Present  FHT: 130bpm  Fetal Surveillance Testing today: doppler   Chaperone: N/A    No results found for this or any previous visit (from the past 24 hour(s)).   Assessment & Plan:  High-risk pregnancy: [id number] at [redacted]w[redacted]d with an Estimated Date of Delivery: 12/11/22   1) GHTN>Preeclampsia no severe features -plan to increase to Labetalol 200mg  bid -remains asymptomatic -lab work today and continue weekly -plan to start antepartum testing @ 32wks -reviewed preeclampsia precautions  2)  GDMA1 Encouraged pt to bring logs  -morbid obesity -plan for repeat C-section and BTL   Meds:  Meds ordered this encounter  Medications   labetalol (NORMODYNE) 200 MG tablet    Sig: Take 1 tablet (200 mg total) by mouth 2 (two) times daily.    Dispense:  60 tablet    Refill:  3    Labs/procedures today: CBC, CMP  Treatment Plan:  as outlined above  Reviewed: Preterm labor symptoms and general obstetric precautions including but not limited to vaginal bleeding, contractions, leaking of fluid and fetal movement were reviewed in detail with the patient.  All questions were answered. PT has home bp cuff. Check bp weekly, let us know if >140/90.   Follow-up: Return in about 1 week (around 10/11/2022) for HROB visit AND in 2wk BPP/NSTweekly and growth q 4wks (next due 4/27).   Future Appointments  Date Time Provider Department Center  10/11/2022  9:50 AM Arabella Merles, CNM [facility]  10/23/2022 11:30 AM [facility] [place] [facility] None  10/30/2022 10:45 AM [facility] [place] [facility] None  11/06/2022 10:00 AM [facility] [place] [facility] None  11/13/2022 10:00 AM [facility] [place] [facility] None  11/21/2022 10:45 AM [facility] [place] [facility] None  11/27/2022 10:00 AM [facility] [place] [facility] None    Orders Placed This Encounter  Procedures   CBC   Comprehensive metabolic panel    Myna Hidalgo, DO Attending Obstetrician & Gynecologist, Faculty Practice Center for [employer], [facility]

## 2022-10-05 LAB — CBC
Hematocrit: 32.3 % — ABNORMAL LOW (ref 34.0–46.6)
Hemoglobin: 10.2 g/dL — ABNORMAL LOW (ref 11.1–15.9)
MCH: 24.7 pg — ABNORMAL LOW (ref 26.6–33.0)
MCHC: 31.6 g/dL (ref 31.5–35.7)
MCV: 78 fL — ABNORMAL LOW (ref 79–97)
Platelets: 291 10*3/uL (ref 150–450)
RBC: 4.13 x10E6/uL (ref 3.77–5.28)
RDW: 15.3 % (ref 11.7–15.4)
WBC: 10.6 10*3/uL (ref 3.4–10.8)

## 2022-10-05 LAB — COMPREHENSIVE METABOLIC PANEL
ALT: 13 IU/L (ref 0–32)
AST: 11 IU/L (ref 0–40)
Albumin/Globulin Ratio: 1.3 (ref 1.2–2.2)
Albumin: 3.5 g/dL — ABNORMAL LOW (ref 3.9–4.9)
Alkaline Phosphatase: 165 IU/L — ABNORMAL HIGH (ref 44–121)
BUN/Creatinine Ratio: 20 (ref 9–23)
BUN: 7 mg/dL (ref 6–20)
Bilirubin Total: <0.2 mg/dL (ref 0.0–1.2)
CO2: 20 mmol/L (ref 20–29)
Calcium: 9 mg/dL (ref 8.7–10.2)
Chloride: 103 mmol/L (ref 96–106)
Creatinine, Ser: 0.35 mg/dL — ABNORMAL LOW (ref 0.57–1.00)
Globulin, Total: 2.8 g/dL (ref 1.5–4.5)
Glucose: 81 mg/dL (ref 70–99)
Potassium: 4.3 mmol/L (ref 3.5–5.2)
Sodium: 138 mmol/L (ref 134–144)
Total Protein: 6.3 g/dL (ref 6.0–8.5)
eGFR: 137 mL/min/{1.73_m2} (ref >59)

## 2022-10-11 ENCOUNTER — Ambulatory Visit (INDEPENDENT_AMBULATORY_CARE_PROVIDER_SITE_OTHER): Payer: Medicaid Other | Admitting: Advanced Practice Midwife

## 2022-10-11 VITALS — BP 117/79 | HR 93 | Wt 335.2 lb

## 2022-10-11 DIAGNOSIS — Z23 Encounter for immunization: Secondary | ICD-10-CM | POA: Diagnosis not present

## 2022-10-11 DIAGNOSIS — Z3A31 31 weeks gestation of pregnancy: Secondary | ICD-10-CM

## 2022-10-11 DIAGNOSIS — Z331 Pregnant state, incidental: Secondary | ICD-10-CM

## 2022-10-11 DIAGNOSIS — Z1389 Encounter for screening for other disorder: Secondary | ICD-10-CM

## 2022-10-11 DIAGNOSIS — F418 Other specified anxiety disorders: Secondary | ICD-10-CM

## 2022-10-11 DIAGNOSIS — Z3483 Encounter for supervision of other normal pregnancy, third trimester: Secondary | ICD-10-CM

## 2022-10-11 DIAGNOSIS — Z3480 Encounter for supervision of other normal pregnancy, unspecified trimester: Secondary | ICD-10-CM

## 2022-10-11 LAB — POCT URINALYSIS DIPSTICK OB
Blood, UA: NEGATIVE
Glucose, UA: NEGATIVE
Ketones, UA: NEGATIVE
Leukocytes, UA: NEGATIVE
Nitrite, UA: NEGATIVE
POC,PROTEIN,UA: NEGATIVE

## 2022-10-11 NOTE — Patient Instructions (Signed)
Summer Terrell, thank you for choosing our office today! We appreciate the opportunity to meet your healthcare needs. You may receive a short survey by mail, e-mail, or through [employer]. If you are happy with your care we would appreciate if you could take just a few minutes to complete the survey questions. We read all of your comments and take your feedback very seriously. Thank you again for choosing our office.  Center for [employer] Team at [facility]  [facility] at [facility] ([address] [place], [place] [address]) Entrance C, located off of [address] Free 24/7 valet parking   CLASSES: Go to [email or web address] to register for classes (childbirth, breastfeeding, waterbirth, infant CPR, daddy bootcamp, etc.)  Call the office [phone]) or go to [facility] if: You begin to have strong, frequent contractions Your water breaks.  Sometimes it is a big gush of fluid, sometimes it is just a trickle that keeps getting your panties wet or running down your legs You have vaginal bleeding.  It is normal to have a small amount of spotting if your cervix was checked.  You don't feel your baby moving like normal.  If you don't, get you something to eat and drink and lay down and focus on feeling your baby move.   If your baby is still not moving like normal, you should call the office or go to [facility].  Call the office [phone]) or go to [facility] hospital for these signs of pre-eclampsia: Severe headache that does not go away with Tylenol Visual changes- seeing spots, double, blurred vision Pain under your right breast or upper abdomen that does not go away with Tums or heartburn medicine Nausea and/or vomiting Severe swelling in your hands, feet, and face   Tdap Vaccine It is recommended that you get the Tdap vaccine during the third trimester of EACH pregnancy to help protect your baby from getting pertussis (whooping cough) 27-36 weeks is the BEST time to do  this so that you can pass the protection on to your baby. During pregnancy is better than after pregnancy, but if you are unable to get it during pregnancy it will be offered at the hospital.  You can get this vaccine with [place], at the health department, your family doctor, or some local pharmacies Everyone who will be around your baby should also be up-to-date on their vaccines before the baby comes. Adults (who are not pregnant) only need 1 dose of Tdap during adulthood.   [facility] Pediatricians/Family Doctors [facility]): [address] Colette Ribas, [phone]           [facility]: [address], [phone]                [facility]): [address], [phone] (call to ask if accepting patients) [facility] Department: [address], [place], [phone]    [facility] Pediatricians/Family Doctors [facility]): [phone], [phone] [facility]: [address] [place], [phone] [facility]: [address], [phone]  [facility] Doctors  [facility]): [phone] [facility]: [address] [phone]   [facility] Doctors [facility]: [address] [phone]  [facility] Family Doctors  [facility]: [phone], [phone]  Home Blood Pressure Monitoring for Patients   Your provider has recommended that you check your  blood pressure (BP) at least once a week at home. If you do not have a blood pressure cuff at home, one will be provided for you. Contact your provider if you have not received your monitor within 1 week.   Helpful Tips for Accurate Home Blood Pressure Checks  Don't smoke, exercise, or drink caffeine 30 minutes before checking your BP Use the restroom before checking your BP (a full bladder can raise your  pressure) Relax in a comfortable upright chair Feet on the ground Left arm resting comfortably on a flat surface at the level of your heart Legs uncrossed Back supported Sit quietly and don't talk Place the cuff on your bare arm Adjust snuggly, so that only two fingertips can fit between your skin and the top of the cuff Check 2 readings separated by at least one minute Keep a log of your BP readings For a visual, please reference this diagram: http://ccnc.care/bpdiagram  Provider Name: [facility]     Phone: [phone]  Zone 1: ALL CLEAR  Continue to monitor your symptoms:  BP reading is less than 140 (top number) or less than 90 (bottom number)  No right upper stomach pain No headaches or seeing spots No feeling nauseated or throwing up No swelling in face and hands  Zone 2: CAUTION Call your doctor's office for any of the following:  BP reading is greater than 140 (top number) or greater than 90 (bottom number)  Stomach pain under your ribs in the middle or right side Headaches or seeing spots Feeling nauseated or throwing up Swelling in face and hands  Zone 3: EMERGENCY  Seek immediate medical care if you have any of the following:  BP reading is greater than160 (top number) or greater than 110 (bottom number) Severe headaches not improving with Tylenol Serious difficulty catching your breath Any worsening symptoms from Zone 2   Third Trimester of Pregnancy The third trimester is from week 29 through week 42, months 7 through 9. The third trimester is a time when the fetus is growing rapidly. At the end of the ninth month, the fetus is about 20 inches in length and weighs 6-10 pounds.  BODY CHANGES Your body goes through many changes during pregnancy. The changes vary from woman to woman.  Your weight will continue to increase. You can expect to gain 25-35 pounds (11-16 kg) by the end of the pregnancy. You may begin to get stretch marks on your hips, abdomen,  and breasts. You may urinate more often because the fetus is moving lower into your pelvis and pressing on your bladder. You may develop or continue to have heartburn as a result of your pregnancy. You may develop constipation because certain hormones are causing the muscles that push waste through your intestines to slow down. You may develop hemorrhoids or swollen, bulging veins (varicose veins). You may have pelvic pain because of the weight gain and pregnancy hormones relaxing your joints between the bones in your pelvis. Backaches may result from overexertion of the muscles supporting your posture. You may have changes in your hair. These can include thickening of your hair, rapid growth, and changes in texture. Some women also have hair loss during or after pregnancy, or hair that feels dry or thin. Your hair will most likely return to normal after your baby is born. Your breasts will continue to grow and be tender. A yellow discharge may leak from your breasts called colostrum. Your belly button may stick out. You may   feel short of breath because of your expanding uterus. You may notice the fetus "dropping," or moving lower in your abdomen. You may have a bloody mucus discharge. This usually occurs a few days to a week before labor begins. Your cervix becomes thin and soft (effaced) near your due date. WHAT TO EXPECT AT YOUR PRENATAL EXAMS  You will have prenatal exams every 2 weeks until week 36. Then, you will have weekly prenatal exams. During a routine prenatal visit: You will be weighed to make sure you and the fetus are growing normally. Your blood pressure is taken. Your abdomen will be measured to track your baby's growth. The fetal heartbeat will be listened to. Any test results from the previous visit will be discussed. You may have a cervical check near your due date to see if you have effaced. At around 36 weeks, your caregiver will check your cervix. At the same time, your  caregiver will also perform a test on the secretions of the vaginal tissue. This test is to determine if a type of bacteria, Group B streptococcus, is present. Your caregiver will explain this further. Your caregiver may ask you: What your birth plan is. How you are feeling. If you are feeling the baby move. If you have had any abnormal symptoms, such as leaking fluid, bleeding, severe headaches, or abdominal cramping. If you have any questions. Other tests or screenings that may be performed during your third trimester include: Blood tests that check for low iron levels (anemia). Fetal testing to check the health, activity level, and growth of the fetus. Testing is done if you have certain medical conditions or if there are problems during the pregnancy. FALSE LABOR You may feel small, irregular contractions that eventually go away. These are called Braxton Hicks contractions, or false labor. Contractions may last for hours, days, or even weeks before true labor sets in. If contractions come at regular intervals, intensify, or become painful, it is best to be seen by your caregiver.  SIGNS OF LABOR  Menstrual-like cramps. Contractions that are 5 minutes apart or less. Contractions that start on the top of the uterus and spread down to the lower abdomen and back. A sense of increased pelvic pressure or back pain. A watery or bloody mucus discharge that comes from the vagina. If you have any of these signs before the 37th week of pregnancy, call your caregiver right away. You need to go to the hospital to get checked immediately. HOME CARE INSTRUCTIONS  Avoid all smoking, herbs, alcohol, and unprescribed drugs. These chemicals affect the formation and growth of the baby. Follow your caregiver's instructions regarding medicine use. There are medicines that are either safe or unsafe to take during pregnancy. Exercise only as directed by your caregiver. Experiencing uterine cramps is a good sign to  stop exercising. Continue to eat regular, healthy meals. Wear a good support bra for breast tenderness. Do not use hot tubs, steam rooms, or saunas. Wear your seat belt at all times when driving. Avoid raw meat, uncooked cheese, cat litter boxes, and soil used by cats. These carry germs that can cause birth defects in the baby. Take your prenatal vitamins. Try taking a stool softener (if your caregiver approves) if you develop constipation. Eat more high-fiber foods, such as fresh vegetables or fruit and whole grains. Drink plenty of fluids to keep your urine clear or pale yellow. Take warm sitz baths to soothe any pain or discomfort caused by hemorrhoids. Use hemorrhoid cream if   your caregiver approves. If you develop varicose veins, wear support hose. Elevate your feet for 15 minutes, 3-4 times a day. Limit salt in your diet. Avoid heavy lifting, wear low heal shoes, and practice good posture. Rest a lot with your legs elevated if you have leg cramps or low back pain. Visit your dentist if you have not gone during your pregnancy. Use a soft toothbrush to brush your teeth and be gentle when you floss. A sexual relationship may be continued unless your caregiver directs you otherwise. Do not travel far distances unless it is absolutely necessary and only with the approval of your caregiver. Take prenatal classes to understand, practice, and ask questions about the labor and delivery. Make a trial run to the hospital. Pack your hospital bag. Prepare the baby's nursery. Continue to go to all your prenatal visits as directed by your caregiver. SEEK MEDICAL CARE IF: You are unsure if you are in labor or if your water has broken. You have dizziness. You have mild pelvic cramps, pelvic pressure, or nagging pain in your abdominal area. You have persistent nausea, vomiting, or diarrhea. You have a bad smelling vaginal discharge. You have pain with urination. SEEK IMMEDIATE MEDICAL CARE IF:  You  have a fever. You are leaking fluid from your vagina. You have spotting or bleeding from your vagina. You have severe abdominal cramping or pain. You have rapid weight loss or gain. You have shortness of breath with chest pain. You notice sudden or extreme swelling of your face, hands, ankles, feet, or legs. You have not felt your baby move in over an hour. You have severe headaches that do not go away with medicine. You have vision changes. Document Released: 06/06/2001 Document Revised: 06/17/2013 Document Reviewed: 08/13/2012 [facility] Patient Information 2015 [place], [place]. This information is not intended to replace advice given to you by your health care provider. Make sure you discuss any questions you have with your health care provider.

## 2022-10-11 NOTE — Progress Notes (Signed)
HIGH-RISK PREGNANCY VISIT Patient name: Summer Terrell MRN [id number]  Date of birth: May 06, 1986 Chief Complaint:   Routine Prenatal Visit (Tdap today)  History of Present Illness:   Summer Terrell is a 37 y.o. [phone] female at [redacted]w[redacted]d with an Estimated Date of Delivery: 12/11/22 being seen today for ongoing management of a high-risk pregnancy complicated by diabetes mellitus A1DM and pre-e without SF. Doesn't check fasting CBG values; says 2h PP values are in the 80s (didn't see log).  Getting BPs within range on Lab  bid.   Today she reports no complaints. Contractions: Not present.  .  Movement: Present. denies leaking of fluid.      05/29/2022    9:48 AM 02/04/2021    8:53 AM 11/04/2020   10:21 AM 09/24/2020   12:17 PM  Depression screen PHQ 2/9  Decreased Interest 0 0 0 1  Down, Depressed, Hopeless 0 PHQ - 2 Score 0 Altered sleeping 0 1 0 1  Tired, decreased energy Change in appetite 0 0 0 1  Feeling bad or failure about yourself  0 0 0 0  Trouble concentrating 0 0 0 1  Moving slowly or fidgety/restless 0 0 0 0  Suicidal thoughts 0 0 0 0  PHQ-9 Score 05/29/2022    9:48 AM 02/04/2021    8:53 AM 11/04/2020   10:21 AM 09/24/2020   12:18 PM  GAD 7 : Generalized Anxiety Score  Nervous, Anxious, on Edge Control/stop worrying Worry too much - different things Trouble relaxing Restless 0 0 0 0  Easily annoyed or irritable Afraid - awful might happen 0 0 0 0  Total GAD 7 Score Review of Systems:   Pertinent items are noted in HPI Denies abnormal vaginal discharge w/ itching/odor/irritation, headaches, visual changes, shortness of breath, chest pain, abdominal pain, severe nausea/vomiting, or problems with urination or bowel movements unless otherwise stated above. Pertinent History Reviewed:  Reviewed past medical,surgical, social, obstetrical and family history.  Reviewed  problem list, medications and allergies. Physical Assessment:   Vitals:   10/11/22 1009  BP: 117/79  Pulse: 93  Weight: (!) 335 lb 3.2 oz (152 kg)  Body mass index is 50.97 kg/m.           Physical Examination:   General appearance: alert, well appearing, and in no distress  Mental status: alert, oriented to person, place, and time  Skin: warm & dry   Extremities: Edema: Trace    Cardiovascular: normal heart rate noted  Respiratory: normal respiratory effort, no distress  Abdomen: gravid, soft, non-tender  Pelvic: Cervical exam deferred         Fetal Status: Fetal Heart Rate (bpm): 132   Movement: Present    Fetal Surveillance Testing today: doppler    Results for orders placed or performed in visit on 10/11/22 (from the past 24 hour(s))  POC Urinalysis Dipstick OB   Collection Time: 10/11/22  1:24 PM  Result Value Ref Range   Color, UA     Clarity, UA     Glucose, UA Negative Negative   Bilirubin, UA     Ketones, UA negative    Spec Grav, UA  Blood, UA negative    pH, UA     POC,PROTEIN,UA Negative Negative, Trace, Small (1+), Moderate (2+), Large (3+), 4+   Urobilinogen, UA     Nitrite, UA negative    Leukocytes, UA Negative Negative   Appearance     Odor      Assessment & Plan:  High-risk pregnancy: [id number] at [redacted]w[redacted]d with an Estimated Date of Delivery: 12/11/22   1) Pre-e without SF, BP doing well on Lab  bid; start 2x/wk testing @ 32wks (scheduled)  2) GDMA1, CBGs seem stable; no fastings collected; says 2hr PP values are in the 80s  3) Prev C/S x 2, for rLTCS/BTL @ 37wks or before  4) Anxiety, doing well without Zoloft, but may take PP  Meds: No orders of the defined types were placed in this encounter.   Labs/procedures today: tdap  Treatment Plan:  start 2x/wk testing next week w growth q 4wks; delivery  if not before for severe symptoms  Reviewed: Preterm labor symptoms and general obstetric precautions including but not limited to  vaginal bleeding, contractions, leaking of fluid and fetal movement were reviewed in detail with the patient.  All questions were answered. Does have home bp cuff. Office bp cuff given: not applicable. Check bp daily, let us know if consistently >140 and/or >90.  Follow-up: Return for As scheduled.   Future Appointments  Date Time Provider Department Center  10/23/2022 11:30 AM [facility] [place] [facility] None  10/23/2022  1:30 PM Myna Hidalgo, DO [facility]  10/26/2022  9:50 AM [facility] NURSE [facility]  10/30/2022 10:45 AM [facility] [place] [facility] None  10/30/2022 11:30 AM Cheral Marker, CNM [facility]  11/02/2022  9:50 AM [facility] NURSE [facility]  11/06/2022 10:00 AM [facility] [place] [facility] None  11/06/2022 10:50 AM Jacklyn Shell, CNM [facility]  11/09/2022 10:10 AM [facility] NURSE [facility]  11/13/2022 10:00 AM [facility] - [facility] [place] [facility] None  11/13/2022 11:10 AM Lazaro Arms, MD [facility]  11/16/2022  9:30 AM [facility] NURSE [facility]  11/21/2022 10:45 AM [facility] - [facility] [place] [facility] None  11/27/2022 10:00 AM [facility] [place] [facility] None  11/27/2022 10:50 AM Myna Hidalgo, DO [facility]  11/30/2022  9:50 AM [facility] NURSE [facility]    Orders Placed This Encounter  Procedures   Tdap vaccine greater than or equal to 7yo IM   POC Urinalysis Dipstick OB   Arabella Merles CNM 10/11/2022 1:27 PM

## 2022-10-19 ENCOUNTER — Encounter: Payer: Self-pay | Admitting: *Deleted

## 2022-10-20 ENCOUNTER — Other Ambulatory Visit: Payer: Self-pay | Admitting: Advanced Practice Midwife

## 2022-10-20 DIAGNOSIS — O1493 Unspecified pre-eclampsia, third trimester: Secondary | ICD-10-CM

## 2022-10-20 DIAGNOSIS — O99210 Obesity complicating pregnancy, unspecified trimester: Secondary | ICD-10-CM

## 2022-10-23 ENCOUNTER — Ambulatory Visit (INDEPENDENT_AMBULATORY_CARE_PROVIDER_SITE_OTHER): Payer: Medicaid Other

## 2022-10-23 ENCOUNTER — Encounter: Payer: Self-pay | Admitting: Obstetrics & Gynecology

## 2022-10-23 ENCOUNTER — Ambulatory Visit (INDEPENDENT_AMBULATORY_CARE_PROVIDER_SITE_OTHER): Payer: Medicaid Other | Admitting: Obstetrics & Gynecology

## 2022-10-23 VITALS — BP 138/85 | HR 89 | Wt 339.0 lb

## 2022-10-23 DIAGNOSIS — Z98891 History of uterine scar from previous surgery: Secondary | ICD-10-CM

## 2022-10-23 DIAGNOSIS — O0993 Supervision of high risk pregnancy, unspecified, third trimester: Secondary | ICD-10-CM

## 2022-10-23 DIAGNOSIS — Z3A33 33 weeks gestation of pregnancy: Secondary | ICD-10-CM

## 2022-10-23 DIAGNOSIS — O99213 Obesity complicating pregnancy, third trimester: Secondary | ICD-10-CM | POA: Diagnosis not present

## 2022-10-23 DIAGNOSIS — O99210 Obesity complicating pregnancy, unspecified trimester: Secondary | ICD-10-CM

## 2022-10-23 DIAGNOSIS — O1493 Unspecified pre-eclampsia, third trimester: Secondary | ICD-10-CM | POA: Diagnosis not present

## 2022-10-23 DIAGNOSIS — O24410 Gestational diabetes mellitus in pregnancy, diet controlled: Secondary | ICD-10-CM

## 2022-10-23 DIAGNOSIS — F419 Anxiety disorder, unspecified: Secondary | ICD-10-CM

## 2022-10-23 NOTE — Progress Notes (Signed)
[place] 33 wks,cephalic,BPP 8/8,AFI 19 cm,RI .69,.69,.69=85%,posterior placenta gr 3,EFW 2332 g 72%

## 2022-10-23 NOTE — Progress Notes (Signed)
HIGH-RISK PREGNANCY VISIT Patient name: Summer Terrell MRN [id number]  Date of birth: [birth year] Chief Complaint:   Routine Prenatal Visit  History of Present Illness:   Summer Terrell is a 37 y.o. [id number] female at [redacted]w[redacted]d with an Estimated Date of Delivery: 12/11/22 being seen today for ongoing management of a high-risk pregnancy complicated by:  -Preeclampsia no severe features: Currently on labetalol 200 twice daily Remains asymptomatic BPs mostly 130s to 140s over 70s to 80s, at home elevated x 1 150/80s Using baby scripts  -Prior C-section x 2, []  plan for repeat with salpingectomy -GDMA1  -Anxiety-no meds currently plan for Zoloft postpartum  Today she reports  work is still causing a lot of stress.  Denies headache or blurry vision.  Denies RUQ pain.  Overall doing well  Contractions: Not present. Vag. Bleeding: None.  Movement: Present. denies leaking of fluid.      05/29/2022    9:48 AM 02/04/2021    8:53 AM 11/04/2020   10:21 AM 09/24/2020   12:17 PM  Depression screen PHQ 2/9  Decreased Interest 0 0 0 1  Down, Depressed, Hopeless 0 1 1 1   PHQ - 2 Score 0 1 1 2   Altered sleeping 0 1 0 1  Tired, decreased energy 2 1 2 2   Change in appetite 0 0 0 1  Feeling bad or failure about yourself  0 0 0 0  Trouble concentrating 0 0 0 1  Moving slowly or fidgety/restless 0 0 0 0  Suicidal thoughts 0 0 0 0  PHQ-9 Score 2 3 3 7      Current Outpatient Medications  Medication Instructions   Accu-Chek Softclix Lancets lancets Use as instructed to check blood sugar 4 times daily   acetaminophen (TYLENOL) 1,000 mg, Every 6 hours PRN   aspirin EC 162 mg, Oral, Daily, Swallow whole.   Blood Glucose Monitoring Suppl (ACCU-CHEK GUIDE ME) w/Device KIT 1 each, Does not apply, 4 times daily   Blood Pressure Monitor MISC For regular home bp monitoring during pregnancy   ferrous sulfate 325 mg, Oral, Every other day   glucose blood (ACCU-CHEK GUIDE) test strip Use as instructed to check  blood sugar 4 times daily   labetalol (NORMODYNE) 200 mg, Oral, 2 times daily   Prenatal Vit-Fe Fumarate-FA (PRENATAL VITAMIN PO) Oral, Gummy-1 daily   sertraline (ZOLOFT) 50 MG tablet Take 1/2 tablet x 14 days then increase to 1 tablet daily     Review of Systems:   Pertinent items are noted in HPI Denies abnormal vaginal discharge w/ itching/odor/irritation,  shortness of breath, chest pain, abdominal pain, severe nausea/vomiting, or problems with urination or bowel movements unless otherwise stated above. Pertinent History Reviewed:  Reviewed past medical,surgical, social, obstetrical and family history.  Reviewed problem list, medications and allergies. Physical Assessment:   Vitals:   10/23/22 1231 10/23/22 1246  BP: (!) 151/88 138/85  Pulse: 84 89  Weight: (!) 339 lb (153.8 kg)   Body mass index is 51.54 kg/m.           Physical Examination:   General appearance: alert, well appearing, and in no distress  Mental status: normal mood, behavior, speech, dress, motor activity, and thought processes  Skin: warm & dry   Extremities: Edema: Trace    Cardiovascular: normal heart rate noted  Respiratory: normal respiratory effort, no distress  Abdomen: gravid, soft, non-tender  Pelvic: Cervical exam deferred         Fetal Status:  Movement: Present    Fetal Surveillance Testing today: cephalic,BPP 8/8,AFI 19 cm,RI .69,.69,.69=85%,posterior placenta gr 3,EFW 2332 g 72%    Chaperone: N/A    No results found for this or any previous visit (from the past 24 hour(s)).   Assessment & Plan:  High-risk pregnancy: [id number] at [redacted]w[redacted]d with an Estimated Date of Delivery: 12/11/22   -Preeclampsia no severe features:  Continue current medication Continue lab work weekly, to be collected today Twice-weekly testing scheduled Current goal of delivery at 37 weeks, patient aware that this may change pending maternal/fetal status  -Prior C-section x 2, []  plan for repeat with  salpingectomy  -GDMA1 -Morbid obesity  Meds: No orders of the defined types were placed in this encounter.   Labs/procedures today: BPP, lab work  Treatment Plan: As outlined above  Reviewed: Preterm labor symptoms and general obstetric precautions including but not limited to vaginal bleeding, contractions, leaking of fluid and fetal movement were reviewed in detail with the patient.  All questions were answered. Pt has home bp cuff. Check bp weekly, let us know if >140/90.   Follow-up: Return for twice weekly as scheduled.   Future Appointments  Date Time Provider Department Center  10/23/2022  1:30 PM Myna Hidalgo, DO [facility]  10/26/2022  9:50 AM [facility] NUR[facility]  10/30/2022 10:45 AM [facility] - [facility] [place] [facility] None  10/30/2022 11:30 AM Cheral Marker, CNM [facility]  11/02/2022  9:50 AM [facility]  11/06/2022 10:00 AM [facility] [place] [facility] None  11/06/2022 10:50 AM Jacklyn Shell, CNM [facility]  11/09/2022 10:10 AM [facility]  11/13/2022 10:00 AM [facility] [place] [facility] None  11/13/2022 11:10 AM Lazaro Arms, MD [facility]  11/16/2022  9:30 AM [facility]  11/21/2022 10:45 AM [facility] [place] [facility] None  11/27/2022 10:00 AM [facility] [place] [facility] None  11/27/2022 10:50 AM Myna Hidalgo, DO [facility]  11/30/2022  9:50 AM [facility]    Orders Placed This Encounter  Procedures   CBC   Comprehensive metabolic panel    Myna Hidalgo, DO Attending Obstetrician & Gynecologist, [facility] [employer], [facility]

## 2022-10-24 LAB — COMPREHENSIVE METABOLIC PANEL
ALT: 10 IU/L (ref 0–32)
AST: 11 IU/L (ref 0–40)
Albumin/Globulin Ratio: 1.3 (ref 1.2–2.2)
Albumin: 3.5 g/dL — ABNORMAL LOW (ref 3.9–4.9)
Alkaline Phosphatase: 177 IU/L — ABNORMAL HIGH (ref 44–121)
BUN/Creatinine Ratio: 18 (ref 9–23)
BUN: 6 mg/dL (ref 6–20)
Bilirubin Total: <0.2 mg/dL (ref 0.0–1.2)
CO2: 18 mmol/L — ABNORMAL LOW (ref 20–29)
Calcium: 8.8 mg/dL (ref 8.7–10.2)
Chloride: 101 mmol/L (ref 96–106)
Creatinine, Ser: 0.33 mg/dL — ABNORMAL LOW (ref 0.57–1.00)
Globulin, Total: 2.8 g/dL (ref 1.5–4.5)
Glucose: 120 mg/dL — ABNORMAL HIGH (ref 70–99)
Potassium: 4.2 mmol/L (ref 3.5–5.2)
Sodium: 138 mmol/L (ref 134–144)
Total Protein: 6.3 g/dL (ref 6.0–8.5)
eGFR: 139 mL/min/{1.73_m2} (ref >59)

## 2022-10-24 LAB — CBC
Hematocrit: 31.2 % — ABNORMAL LOW (ref 34.0–46.6)
Hemoglobin: 9.9 g/dL — ABNORMAL LOW (ref 11.1–15.9)
MCH: 24.6 pg — ABNORMAL LOW (ref 26.6–33.0)
MCHC: 31.7 g/dL (ref 31.5–35.7)
MCV: 77 fL — ABNORMAL LOW (ref 79–97)
Platelets: 287 10*3/uL (ref 150–450)
RBC: 4.03 x10E6/uL (ref 3.77–5.28)
RDW: 15.2 % (ref 11.7–15.4)
WBC: 10.3 10*3/uL (ref 3.4–10.8)

## 2022-10-26 ENCOUNTER — Ambulatory Visit (INDEPENDENT_AMBULATORY_CARE_PROVIDER_SITE_OTHER): Payer: Medicaid Other | Admitting: *Deleted

## 2022-10-26 ENCOUNTER — Other Ambulatory Visit: Payer: Self-pay | Admitting: Obstetrics & Gynecology

## 2022-10-26 VITALS — BP 132/87 | HR 93

## 2022-10-26 DIAGNOSIS — Z3A33 33 weeks gestation of pregnancy: Secondary | ICD-10-CM

## 2022-10-26 DIAGNOSIS — O0993 Supervision of high risk pregnancy, unspecified, third trimester: Secondary | ICD-10-CM

## 2022-10-26 DIAGNOSIS — O1493 Unspecified pre-eclampsia, third trimester: Secondary | ICD-10-CM

## 2022-10-26 DIAGNOSIS — O24410 Gestational diabetes mellitus in pregnancy, diet controlled: Secondary | ICD-10-CM

## 2022-10-26 DIAGNOSIS — F419 Anxiety disorder, unspecified: Secondary | ICD-10-CM

## 2022-10-26 MED ORDER — HYDROXYZINE HCL 25 MG PO TABS
25.0000 mg | ORAL_TABLET | Freq: Three times a day (TID) | ORAL | 3 refills | Status: AC | PRN
Start: 2022-10-26 — End: ?

## 2022-10-26 NOTE — Progress Notes (Signed)
   NURSE VISIT- NST  SUBJECTIVE:  Summer Terrell is a 37 y.o. [phone] female at [redacted]w[redacted]d, here for a NST for pregnancy complicated by [facility] and Pre-eclampsia.  She reports active fetal movement, contractions: none, vaginal bleeding: none, membranes: intact. Says her anxiety is bad and wants to try the Hydroxyzine that was offered at her last visit.   OBJECTIVE:  BP 132/87   Pulse 93   LMP 03/06/2022 (Approximate)   Appears well, no apparent distress  No results found for this or any previous visit (from the past 24 hour(s)).  NST: FHR baseline 145 bpm, Variability: moderate, Accelerations:present, Decelerations:  Absent= Cat 1/reactive Toco: none   ASSESSMENT: [id number] at [redacted]w[redacted]d with GHTN and Pre-eclampsia NST reactive  PLAN: EFM strip reviewed by Dr. Charlotta Newton   Recommendations: keep next appointment as scheduled   Will send in Hydroxyzine for anxiety  [name]  10/26/2022 11:51 AM

## 2022-10-26 NOTE — Progress Notes (Signed)
Rx for anxiety  Summer Hidalgo, DO Attending Obstetrician & Gynecologist, [facility] for [employer], [facility]

## 2022-10-27 ENCOUNTER — Other Ambulatory Visit: Payer: Self-pay | Admitting: Obstetrics & Gynecology

## 2022-10-27 DIAGNOSIS — O0993 Supervision of high risk pregnancy, unspecified, third trimester: Secondary | ICD-10-CM

## 2022-10-27 DIAGNOSIS — O1493 Unspecified pre-eclampsia, third trimester: Secondary | ICD-10-CM

## 2022-10-30 ENCOUNTER — Ambulatory Visit (INDEPENDENT_AMBULATORY_CARE_PROVIDER_SITE_OTHER): Payer: Medicaid Other

## 2022-10-30 ENCOUNTER — Encounter: Payer: Self-pay | Admitting: Women's Health

## 2022-10-30 ENCOUNTER — Ambulatory Visit (INDEPENDENT_AMBULATORY_CARE_PROVIDER_SITE_OTHER): Payer: Medicaid Other | Admitting: Women's Health

## 2022-10-30 VITALS — BP 134/84 | HR 85 | Wt 342.4 lb

## 2022-10-30 DIAGNOSIS — Z3A34 34 weeks gestation of pregnancy: Secondary | ICD-10-CM

## 2022-10-30 DIAGNOSIS — R8290 Unspecified abnormal findings in urine: Secondary | ICD-10-CM

## 2022-10-30 DIAGNOSIS — O0993 Supervision of high risk pregnancy, unspecified, third trimester: Secondary | ICD-10-CM

## 2022-10-30 DIAGNOSIS — O1493 Unspecified pre-eclampsia, third trimester: Secondary | ICD-10-CM

## 2022-10-30 DIAGNOSIS — O24410 Gestational diabetes mellitus in pregnancy, diet controlled: Secondary | ICD-10-CM

## 2022-10-30 LAB — POCT URINALYSIS DIPSTICK OB
Blood, UA: NEGATIVE
Glucose, UA: NEGATIVE
Ketones, UA: NEGATIVE
Leukocytes, UA: NEGATIVE
Nitrite, UA: NEGATIVE
POC,PROTEIN,UA: NEGATIVE

## 2022-10-30 NOTE — Progress Notes (Signed)
HIGH-RISK PREGNANCY VISIT Patient name: Summer Terrell MRN [id number]  Date of birth: 09/21/1985 Chief Complaint:   Routine Prenatal Visit  History of Present Illness:   Summer Terrell is a 37 y.o. [phone] female at [redacted]w[redacted]d with an Estimated Date of Delivery: 12/11/22 being seen today for ongoing management of a high-risk pregnancy complicated by diabetes mellitus A1DM, morbid obesity BMI >=40, and preeclampsia w/o severe features currently on labetalol 200mg  BID.    Today she reports  not checking sugars. Has 1 from one am, but had coffee w/ sweetner and was 111, and one about after meal and was 174. Wonders why had elevated protein on PCR, but dipsticks have been neg . Has had some pressure and noticed bad odor to urine. Denies ha, visual changes, ruq/epigastric pain, n/v.   Contractions: Not present. Vag. Bleeding: None.  Movement: Present. denies leaking of fluid.      05/29/2022    9:48 AM 02/04/2021    8:53 AM 11/04/2020   10:21 AM 09/24/2020   12:17 PM  Depression screen PHQ 2/9  Decreased Interest 0 0 0 1  Down, Depressed, Hopeless 0 1 1 1   PHQ - 2 Score 0 1 1 2   Altered sleeping 0 1 0 1  Tired, decreased energy 2 1 2 2   Change in appetite 0 0 0 1  Feeling bad or failure about yourself  0 0 0 0  Trouble concentrating 0 0 0 1  Moving slowly or fidgety/restless 0 0 0 0  Suicidal thoughts 0 0 0 0  PHQ-9 Score 2 3 3 7         05/29/2022    9:48 AM 02/04/2021    8:53 AM 11/04/2020   10:21 AM 09/24/2020   12:18 PM  GAD 7 : Generalized Anxiety Score  Nervous, Anxious, on Edge 2 1 1 1   Control/stop worrying 1 1 1 1   Worry too much - different things 1 1 1 1   Trouble relaxing 1 1 1 1   Restless 0 0 0 0  Easily annoyed or irritable 1 1 1 1   Afraid - awful might happen 0 0 0 0  Total GAD 7 Score 6 5 5 5      Review of Systems:   Pertinent items are noted in HPI Denies abnormal vaginal discharge w/ itching/odor/irritation, headaches, visual changes, shortness of breath,  chest pain, abdominal pain, severe nausea/vomiting, or problems with urination or bowel movements unless otherwise stated above. Pertinent History Reviewed:  Reviewed past medical,surgical, social, obstetrical and family history.  Reviewed problem list, medications and allergies. Physical Assessment:   Vitals:   10/30/22 1142  BP: 134/84  Pulse: 85  Weight: (!) 342 lb 6.4 oz (155.3 kg)  Body mass index is 52.06 kg/m.           Physical Examination:   General appearance: alert, well appearing, and in no distress  Mental status: alert, oriented to person, place, and time  Skin: warm & dry   Extremities: Edema: Trace    Cardiovascular: normal heart rate noted  Respiratory: normal respiratory effort, no distress  Abdomen: gravid, soft, non-tender  Pelvic: Cervical exam deferred         Fetal Surveillance Testing today: [place] 34 wks,cephalic,BPP 8/8,FRH 140 bpm,posterior placenta gr 3,AFI 16 cm,RI .70,.71,.72=92% with EDF   Chaperone: N/A    No results found for this or any previous visit (from the past 24 hour(s)).  Assessment & Plan:  High-risk pregnancy: [id number] at [redacted]w[redacted]d with  an Estimated Date of Delivery: 12/11/22   1) Pre-e w/o severe features, on labetalol 200mg  BID, ASA 162mg  daily, asymptomatic, no proteinuria on dipstick, PCR was 7.9 (3/21) w/ neg proteinuria on dipsticks. Has bad odor to urine, will send for cx, if + consider repeating PCR after tx. Repeat CBC, CMP today. Reviewed severe pre-e s/s, reasons to seek care.  2) Borderline elevated UAD, 92% w/ good EDF  3) A1DM> noncompliant w/ QID testing, reviewed importance of knowing glycemic control, promises to check QID for the next week and bring log. Last EFW 72% @ 33wks  4) Prev c/s x 2> for RCS w/ BTL (consent 1/31), note routed to Dr. Despina Hidden to schedule RCS  Meds: No orders of the defined types were placed in this encounter.   Labs/procedures today: U/S and urine culture  Treatment Plan:  EFW q4wk, 2x/wk testing  bpp/dopp alt w/ NST, deliver @ 37wks or earlier if indicated  Reviewed: Preterm labor symptoms and general obstetric precautions including but not limited to vaginal bleeding, contractions, leaking of fluid and fetal movement were reviewed in detail with the patient.  All questions were answered. Does have home bp cuff. Office bp cuff given: not applicable. Check bp twice daily, let us know if consistently >140 and/or >90.  Follow-up: Return for As scheduled.   Future Appointments  Date Time Provider Department Center  11/02/2022  9:50 AM [facility] NURSE [facility]  11/06/2022 10:00 AM [facility] - [facility] [place] [facility] None  11/06/2022 10:50 AM Jacklyn Shell, CNM [facility]  11/09/2022 10:10 AM [facility] NUR[facility]  11/13/2022 10:00 AM [facility] [place] [facility] None  11/13/2022 11:10 AM Lazaro Arms, MD [facility]  11/16/2022  9:30 AM [facility] N[facility]  11/21/2022  9:15 AM [facility] [place] [facility] None  11/21/2022 10:10 AM Lazaro Arms, MD [facility]  11/27/2022 10:00 AM [facility] [place] [facility] None  11/27/2022 10:50 AM Myna Hidalgo, DO [facility]  11/30/2022  9:50 AM [facility] NURSE [facility]    Orders Placed This Encounter  Procedures   Urine Culture   CBC   Comprehensive metabolic panel   Cheral Marker CNM, [facility] 10/30/2022 1:14 PM

## 2022-10-30 NOTE — Progress Notes (Signed)
[place] 34 wks,cephalic,BPP 8/8,FRH 140 bpm,posterior placenta gr 3,AFI 16 cm,RI .70,.71,.72=92%

## 2022-10-30 NOTE — Addendum Note (Signed)
Addended by: Moss Mc on: 10/30/2022 02:54 PM   Modules accepted: Orders

## 2022-10-30 NOTE — Patient Instructions (Signed)
Summer Terrell, thank you for choosing our office today! We appreciate the opportunity to meet your healthcare needs. You may receive a short survey by mail, e-mail, or through [employer]. If you are happy with your care we would appreciate if you could take just a few minutes to complete the survey questions. We read all of your comments and take your feedback very seriously. Thank you again for choosing our office.  Center for [employer] Team at [facility]  [facility] at [facility] ([address] [place], [place] [address]) Entrance C, located off of [address] Free 24/7 valet parking   CLASSES: Go to [email or web address] to register for classes (childbirth, breastfeeding, waterbirth, infant CPR, daddy bootcamp, etc.)  Call the office [phone]) or go to [facility] if: You begin to have strong, frequent contractions Your water breaks.  Sometimes it is a big gush of fluid, sometimes it is just a trickle that keeps getting your panties wet or running down your legs You have vaginal bleeding.  It is normal to have a small amount of spotting if your cervix was checked.  You don't feel your baby moving like normal.  If you don't, get you something to eat and drink and lay down and focus on feeling your baby move.   If your baby is still not moving like normal, you should call the office or go to [facility].  Call the office [phone]) or go to [facility] hospital for these signs of pre-eclampsia: Severe headache that does not go away with Tylenol Visual changes- seeing spots, double, blurred vision Pain under your right breast or upper abdomen that does not go away with Tums or heartburn medicine Nausea and/or vomiting Severe swelling in your hands, feet, and face   [facility] Pediatricians/Family Doctors [facility]): [address] Colette Ribas, [phone]           [facility]: [address], [phone]                 [facility]): [address], [phone] (call to ask if accepting patients) [facility] Department: [address] [place], [phone]    [facility] Pediatricians/Family Doctors [facility]): [address], [phone] [facility]: [address] [place], [phone] [facility]: [address], [phone]  [facility] Doctors  [facility]): [phone] [facility]: [address], [phone]   [facility] Doctors [facility]: [address], [phone]  [facility] Doctors  [facility]: [phone], [phone]  Home Blood Pressure Monitoring for Patients   Your provider has recommended that you check your blood pressure (BP) at least once a week at home. If you do not have a blood pressure cuff at home, one will be provided for you. Contact your provider if you have not received your monitor within 1 week.   Helpful Tips for Accurate Home Blood Pressure Checks  Don't smoke, exercise, or drink caffeine 30 minutes before checking your BP Use the restroom before checking your BP (a full bladder can raise your pressure) Relax in a comfortable upright chair Feet on the ground Left arm resting comfortably on a flat surface at the level of your heart Legs uncrossed Back supported Sit quietly and don't talk Place the cuff on your bare arm Adjust snuggly, so that only two fingertips  can fit between your skin and the top of the cuff Check 2 readings separated by at least one minute Keep a log of your BP readings For a visual, please reference this diagram: http://ccnc.care/bpdiagram  Provider Name: [facility]     Phone: [phone]  Zone 1: ALL CLEAR  Continue to monitor your symptoms:  BP reading is less than 140 (top number) or less than 90 (bottom number)  No right  upper stomach pain No headaches or seeing spots No feeling nauseated or throwing up No swelling in face and hands  Zone 2: CAUTION Call your doctor's office for any of the following:  BP reading is greater than 140 (top number) or greater than 90 (bottom number)  Stomach pain under your ribs in the middle or right side Headaches or seeing spots Feeling nauseated or throwing up Swelling in face and hands  Zone 3: EMERGENCY  Seek immediate medical care if you have any of the following:  BP reading is greater than160 (top number) or greater than 110 (bottom number) Severe headaches not improving with Tylenol Serious difficulty catching your breath Any worsening symptoms from Zone 2   Braxton Hicks Contractions Contractions of the uterus can occur throughout pregnancy, but they are not always a sign that you are in labor. You may have practice contractions called Braxton Hicks contractions. These false labor contractions are sometimes confused with true labor. What are [place] contractions? Braxton Hicks contractions are tightening movements that occur in the muscles of the uterus before labor. Unlike true labor contractions, these contractions do not result in opening (dilation) and thinning of the cervix. Toward the end of pregnancy (32-34 weeks), Braxton Hicks contractions can happen more often and may become stronger. These contractions are sometimes difficult to tell apart from true labor because they can be very uncomfortable. You should not feel embarrassed if you go to the hospital with false labor. Sometimes, the only way to tell if you are in true labor is for your health care provider to look for changes in the cervix. The health care provider will do a physical exam and may monitor your contractions. If you are not in true labor, the exam should show that your cervix is not dilating and your water has not broken. If there are no other health problems associated with your  pregnancy, it is completely safe for you to be sent home with false labor. You may continue to have Braxton Hicks contractions until you go into true labor. How to tell the difference between true labor and false labor True labor Contractions last 30-70 seconds. Contractions become very regular. Discomfort is usually felt in the top of the uterus, and it spreads to the lower abdomen and low back. Contractions do not go away with walking. Contractions usually become more intense and increase in frequency. The cervix dilates and gets thinner. False labor Contractions are usually shorter and not as strong as true labor contractions. Contractions are usually irregular. Contractions are often felt in the front of the lower abdomen and in the groin. Contractions may go away when you walk around or change positions while lying down. Contractions get weaker and are shorter-lasting as time goes on. The cervix usually does not dilate or become thin. Follow these instructions at home:  Take over-the-counter and prescription medicines only as told by your health care provider. Keep up with your usual exercises and follow other instructions from your health care provider. Eat and drink lightly if you think  you are going into labor. If Braxton Hicks contractions are making you uncomfortable: Change your position from lying down or resting to walking, or change from walking to resting. Sit and rest in a tub of warm water. Drink enough fluid to keep your urine pale yellow. Dehydration may cause these contractions. Do slow and deep breathing several times an hour. Keep all follow-up prenatal visits as told by your health care provider. This is important. Contact a health care provider if: You have a fever. You have continuous pain in your abdomen. Get help right away if: Your contractions become stronger, more regular, and closer together. You have fluid leaking or gushing from your vagina. You pass  blood-tinged mucus (bloody show). You have bleeding from your vagina. You have low back pain that you never had before. You feel your baby's head pushing down and causing pelvic pressure. Your baby is not moving inside you as much as it used to. Summary Contractions that occur before labor are called Braxton Hicks contractions, false labor, or practice contractions. Braxton Hicks contractions are usually shorter, weaker, farther apart, and less regular than true labor contractions. True labor contractions usually become progressively stronger and regular, and they become more frequent. Manage discomfort from [facility] contractions by changing position, resting in a warm bath, drinking plenty of water, or practicing deep breathing. This information is not intended to replace advice given to you by your health care provider. Make sure you discuss any questions you have with your health care provider. Document Revised: 05/25/2017 Document Reviewed: 10/26/2016 Elsevier Patient Education  [place].

## 2022-10-31 LAB — COMPREHENSIVE METABOLIC PANEL
ALT: 14 IU/L (ref 0–32)
AST: 15 IU/L (ref 0–40)
Albumin/Globulin Ratio: 1.2 (ref 1.2–2.2)
Albumin: 3.3 g/dL — ABNORMAL LOW (ref 3.9–4.9)
Alkaline Phosphatase: 181 IU/L — ABNORMAL HIGH (ref 44–121)
BUN/Creatinine Ratio: 15 (ref 9–23)
BUN: 7 mg/dL (ref 6–20)
Bilirubin Total: <0.2 mg/dL (ref 0.0–1.2)
CO2: 18 mmol/L — ABNORMAL LOW (ref 20–29)
Calcium: 9.1 mg/dL (ref 8.7–10.2)
Chloride: 104 mmol/L (ref 96–106)
Creatinine, Ser: 0.47 mg/dL — ABNORMAL LOW (ref 0.57–1.00)
Globulin, Total: 2.8 g/dL (ref 1.5–4.5)
Glucose: 118 mg/dL — ABNORMAL HIGH (ref 70–99)
Potassium: 3.8 mmol/L (ref 3.5–5.2)
Sodium: 138 mmol/L (ref 134–144)
Total Protein: 6.1 g/dL (ref 6.0–8.5)
eGFR: 127 mL/min/{1.73_m2} (ref >59)

## 2022-10-31 LAB — CBC
Hematocrit: 31.9 % — ABNORMAL LOW (ref 34.0–46.6)
Hemoglobin: 10 g/dL — ABNORMAL LOW (ref 11.1–15.9)
MCH: 24.2 pg — ABNORMAL LOW (ref 26.6–33.0)
MCHC: 31.3 g/dL — ABNORMAL LOW (ref 31.5–35.7)
MCV: 77 fL — ABNORMAL LOW (ref 79–97)
Platelets: 295 10*3/uL (ref 150–450)
RBC: 4.14 x10E6/uL (ref 3.77–5.28)
RDW: 15.8 % — ABNORMAL HIGH (ref 11.7–15.4)
WBC: 9.6 10*3/uL (ref 3.4–10.8)

## 2022-11-01 LAB — URINE CULTURE

## 2022-11-02 ENCOUNTER — Ambulatory Visit (INDEPENDENT_AMBULATORY_CARE_PROVIDER_SITE_OTHER): Payer: Medicaid Other | Admitting: *Deleted

## 2022-11-02 VITALS — BP 124/81 | HR 86 | Wt 343.8 lb

## 2022-11-02 DIAGNOSIS — O24410 Gestational diabetes mellitus in pregnancy, diet controlled: Secondary | ICD-10-CM | POA: Diagnosis not present

## 2022-11-02 DIAGNOSIS — O0993 Supervision of high risk pregnancy, unspecified, third trimester: Secondary | ICD-10-CM

## 2022-11-02 DIAGNOSIS — O1493 Unspecified pre-eclampsia, third trimester: Secondary | ICD-10-CM

## 2022-11-02 DIAGNOSIS — Z3A34 34 weeks gestation of pregnancy: Secondary | ICD-10-CM | POA: Diagnosis not present

## 2022-11-02 NOTE — Progress Notes (Signed)
   NURSE VISIT- NST  SUBJECTIVE:  Summer Terrell is a 37 y.o. [phone] female at [redacted]w[redacted]d, here for a NST for pregnancy complicated by Diabetes: A1DM}, Elevated dopplers, and Pre-eclampsia.  She reports active fetal movement, contractions: none, vaginal bleeding: none, membranes: intact.   OBJECTIVE:  BP 124/81   Pulse 86   Wt (!) 343 lb 12.8 oz (155.9 kg)   LMP 03/06/2022 (Approximate)   BMI 52.27 kg/m   Appears well, no apparent distress  No results found for this or any previous visit (from the past 24 hour(s)).  NST: FHR baseline 125 bpm, Variability: moderate, Accelerations:present, Decelerations:  Absent= Cat 1/reactive Toco: none   ASSESSMENT: [id number] at [redacted]w[redacted]d with Diabetes: A1DM}, Elevated dopplers, and Pre-eclampsia NST reactive  PLAN: EFM strip reviewed by Dr. Despina Hidden   Recommendations: keep next appointment as scheduled    Summer Terrell  11/02/2022 11:25 AM

## 2022-11-06 ENCOUNTER — Encounter: Payer: Self-pay | Admitting: Advanced Practice Midwife

## 2022-11-06 ENCOUNTER — Other Ambulatory Visit: Payer: Self-pay | Admitting: Obstetrics & Gynecology

## 2022-11-06 ENCOUNTER — Ambulatory Visit (INDEPENDENT_AMBULATORY_CARE_PROVIDER_SITE_OTHER): Payer: Medicaid Other

## 2022-11-06 ENCOUNTER — Ambulatory Visit (INDEPENDENT_AMBULATORY_CARE_PROVIDER_SITE_OTHER): Payer: Medicaid Other | Admitting: Advanced Practice Midwife

## 2022-11-06 VITALS — BP 134/84 | HR 81 | Wt 346.0 lb

## 2022-11-06 DIAGNOSIS — O1493 Unspecified pre-eclampsia, third trimester: Secondary | ICD-10-CM

## 2022-11-06 DIAGNOSIS — O0993 Supervision of high risk pregnancy, unspecified, third trimester: Secondary | ICD-10-CM

## 2022-11-06 DIAGNOSIS — Z3A35 35 weeks gestation of pregnancy: Secondary | ICD-10-CM

## 2022-11-06 DIAGNOSIS — O24410 Gestational diabetes mellitus in pregnancy, diet controlled: Secondary | ICD-10-CM

## 2022-11-06 NOTE — Progress Notes (Signed)
[place] 35 wks,cephalic,posterior placenta gr 3,RI .67,.71,.64,.69=82%,AFI 18 cm,FHR 150 bpm,BPP 8/8

## 2022-11-06 NOTE — Progress Notes (Signed)
HIGH-RISK PREGNANCY VISIT Patient name: Summer Terrell MRN [id number]  Date of birth: [birth year] Chief Complaint:   Routine Prenatal Visit  History of Present Illness:   Summer Terrell is a 37 y.o. [id number] female at [redacted]w[redacted]d with an Estimated Date of Delivery: 12/11/22 being seen today for ongoing management of a high-risk pregnancy complicated by diabetes mellitus A1DM and preeclampsia currently on Labetalol 200mg  BID.    Today she reports normal pregnancy complaints.  Plans to stop work next week.. Contractions: Not present. Vag. Bleeding: None.  Movement: Present. denies leaking of fluid. Still not checking blood sugars.  Thinks it will be easier once she stops work.  Strongly encouraged to check fasting and at least once PP during the day.  Discussed possibility of fetal demise with uncontrolled blood sugars.      05/29/2022    9:48 AM 02/04/2021    8:53 AM 11/04/2020   10:21 AM 09/24/2020   12:17 PM  Depression screen PHQ 2/9  Decreased Interest 0 0 0 1  Down, Depressed, Hopeless 0 1 1 1   PHQ - 2 Score 0 1 1 2   Altered sleeping 0 1 0 1  Tired, decreased energy 2 1 2 2   Change in appetite 0 0 0 1  Feeling bad or failure about yourself  0 0 0 0  Trouble concentrating 0 0 0 1  Moving slowly or fidgety/restless 0 0 0 0  Suicidal thoughts 0 0 0 0  PHQ-9 Score 2 3 3 7         05/29/2022    9:48 AM 02/04/2021    8:53 AM 11/04/2020   10:21 AM 09/24/2020   12:18 PM  GAD 7 : Generalized Anxiety Score  Nervous, Anxious, on Edge 2 1 1 1   Control/stop worrying 1 1 1 1   Worry too much - different things 1 1 1 1   Trouble relaxing 1 1 1 1   Restless 0 0 0 0  Easily annoyed or irritable 1 1 1 1   Afraid - awful might happen 0 0 0 0  Total GAD 7 Score 6 5 5 5      Review of Systems:   Pertinent items are noted in HPI Denies abnormal vaginal discharge w/ itching/odor/irritation, headaches, visual changes, shortness of breath, chest pain, abdominal pain, severe nausea/vomiting, or problems  with urination or bowel movements unless otherwise stated above. Pertinent History Reviewed:  Reviewed past medical,surgical, social, obstetrical and family history.  Reviewed problem list, medications and allergies. Physical Assessment:   Vitals:   11/06/22 1104  BP: 134/84  Pulse: 81  Weight: (!) 346 lb (156.9 kg)  Body mass index is 52.61 kg/m.           Physical Examination:   General appearance: alert, well appearing, and in no distress  Mental status: alert, oriented to person, place, and time  Skin: warm & dry   Extremities: Edema: Mild pitting, slight indentation    Cardiovascular: normal heart rate noted  Respiratory: normal respiratory effort, no distress  Abdomen: gravid, soft, non-tender  Pelvic: Cervical exam deferred           Fetal Surveillance Testing today: [place] 35 wks,cephalic,posterior placenta gr 3,RI .67,.71,.64,.69=82%,AFI 18 cm,FHR 150 bpm,BPP 8/8    Chaperone: N/A    No results found for this or any previous visit (from the past 24 hour(s)).  Assessment & Plan:  High-risk pregnancy: [id number] at [redacted]w[redacted]d with an Estimated Date of Delivery: 12/11/22   1. [redacted] weeks gestation of  pregnancy   2. Supervision of high-risk pregnancy, third trimester   3. Pre-eclampsia in third trimester Continue BP meds, weekly labs.  Repeat PrCr ratio--seems unlikely that would be 7.9 w/subsequent negative dips  4. Diet controlled gestational diabetes mellitus (GDM) in third trimester Unknown control, not checking. Promises at least 5 readings between now and Thursday's appt    Meds: No orders of the defined types were placed in this encounter.   Orders:  Orders Placed This Encounter  Procedures   Protein / creatinine ratio, urine     Labs/procedures today:  Orders Placed This Encounter  Procedures   Protein / creatinine ratio, urine   Standing CBC/CMP   Reviewed: Preterm labor symptoms and general obstetric precautions including but not limited to vaginal  bleeding, contractions, leaking of fluid and fetal movement were reviewed in detail with the patient.  All questions were answered. Does have home bp cuff. Office bp cuff given: not applicable. Check bp daily, let us know if consistently >140 and/or >90.  Follow-up: No follow-ups on file.   Future Appointments  Date Time Provider Department Center  11/09/2022 10:10 AM [facility] NURSE [facility]  11/13/2022 10:00 AM [facility] [place] [facility] None  11/13/2022 11:10 AM Lazaro Arms, MD [facility]  11/16/2022  9:30 AM [facility] NURSE [facility]  11/21/2022  9:15 AM [facility] [place] [facility] None  11/21/2022 10:10 AM Lazaro Arms, MD [facility]  11/27/2022 10:00 AM [facility] [place] [facility] None  11/27/2022 10:50 AM Myna Hidalgo, DO [facility]  11/30/2022  9:50 AM [facility] NURSE [facility]    Orders Placed This Encounter  Procedures   Protein / creatinine ratio, urine   Jacklyn Shell , DNP, CNM [facility] 11/06/2022 11:20 AM

## 2022-11-07 LAB — PROTEIN / CREATININE RATIO, URINE
Creatinine, Urine: 215.6 mg/dL
Protein, Ur: 41.9 mg/dL
Protein/Creat Ratio: 194 mg/g creat (ref 0–200)

## 2022-11-09 ENCOUNTER — Encounter (HOSPITAL_COMMUNITY): Payer: Self-pay | Admitting: Obstetrics & Gynecology

## 2022-11-09 ENCOUNTER — Ambulatory Visit (INDEPENDENT_AMBULATORY_CARE_PROVIDER_SITE_OTHER): Payer: Medicaid Other | Admitting: *Deleted

## 2022-11-09 ENCOUNTER — Inpatient Hospital Stay (HOSPITAL_COMMUNITY)
Admission: AD | Admit: 2022-11-09 | Discharge: 2022-11-09 | Disposition: A | Discharge status: Home / Self Care | Payer: Medicaid Other | Attending: Obstetrics & Gynecology | Admitting: Obstetrics & Gynecology

## 2022-11-09 VITALS — BP 112/72 | HR 101 | Wt 342.8 lb

## 2022-11-09 DIAGNOSIS — O0993 Supervision of high risk pregnancy, unspecified, third trimester: Secondary | ICD-10-CM

## 2022-11-09 DIAGNOSIS — Z87891 Personal history of nicotine dependence: Secondary | ICD-10-CM | POA: Diagnosis not present

## 2022-11-09 DIAGNOSIS — O1493 Unspecified pre-eclampsia, third trimester: Secondary | ICD-10-CM

## 2022-11-09 DIAGNOSIS — R030 Elevated blood-pressure reading, without diagnosis of hypertension: Secondary | ICD-10-CM | POA: Diagnosis present

## 2022-11-09 DIAGNOSIS — Z1389 Encounter for screening for other disorder: Secondary | ICD-10-CM

## 2022-11-09 DIAGNOSIS — Z7982 Long term (current) use of aspirin: Secondary | ICD-10-CM | POA: Insufficient documentation

## 2022-11-09 DIAGNOSIS — O24410 Gestational diabetes mellitus in pregnancy, diet controlled: Secondary | ICD-10-CM | POA: Diagnosis not present

## 2022-11-09 DIAGNOSIS — Z3A35 35 weeks gestation of pregnancy: Secondary | ICD-10-CM

## 2022-11-09 DIAGNOSIS — O288 Other abnormal findings on antenatal screening of mother: Secondary | ICD-10-CM

## 2022-11-09 DIAGNOSIS — Z331 Pregnant state, incidental: Secondary | ICD-10-CM

## 2022-11-09 LAB — COMPREHENSIVE METABOLIC PANEL
ALT: 17 U/L (ref 0–44)
AST: 17 U/L (ref 15–41)
Albumin: 2.3 g/dL — ABNORMAL LOW (ref 3.5–5.0)
Alkaline Phosphatase: 140 U/L — ABNORMAL HIGH (ref 38–126)
Anion gap: 11 (ref 5–15)
BUN: 8 mg/dL (ref 6–20)
CO2: 19 mmol/L — ABNORMAL LOW (ref 22–32)
Calcium: 9 mg/dL (ref 8.9–10.3)
Chloride: 103 mmol/L (ref 98–111)
Creatinine, Ser: 0.52 mg/dL (ref 0.44–1.00)
GFR, Estimated: >60 mL/min (ref >60)
Glucose, Bld: 100 mg/dL — ABNORMAL HIGH (ref 70–99)
Potassium: 3.7 mmol/L (ref 3.5–5.1)
Sodium: 133 mmol/L — ABNORMAL LOW (ref 135–145)
Total Bilirubin: 0.2 mg/dL — ABNORMAL LOW (ref 0.3–1.2)
Total Protein: 6.1 g/dL — ABNORMAL LOW (ref 6.5–8.1)

## 2022-11-09 LAB — CBC
HCT: 31.6 % — ABNORMAL LOW (ref 36.0–46.0)
Hemoglobin: 9.9 g/dL — ABNORMAL LOW (ref 12.0–15.0)
MCH: 24.4 pg — ABNORMAL LOW (ref 26.0–34.0)
MCHC: 31.3 g/dL (ref 30.0–36.0)
MCV: 77.8 fL — ABNORMAL LOW (ref 80.0–100.0)
Platelets: 296 10*3/uL (ref 150–400)
RBC: 4.06 MIL/uL (ref 3.87–5.11)
RDW: 16.5 % — ABNORMAL HIGH (ref 11.5–15.5)
WBC: 11.1 10*3/uL — ABNORMAL HIGH (ref 4.0–10.5)
nRBC: 0 % (ref 0.0–0.2)

## 2022-11-09 LAB — URINALYSIS, ROUTINE W REFLEX MICROSCOPIC
Bilirubin Urine: NEGATIVE
Glucose, UA: NEGATIVE mg/dL
Hgb urine dipstick: NEGATIVE
Ketones, ur: NEGATIVE mg/dL
Leukocytes,Ua: NEGATIVE
Nitrite: NEGATIVE
Protein, ur: NEGATIVE mg/dL
Specific Gravity, Urine: 1.01 (ref 1.005–1.030)
pH: 6 (ref 5.0–8.0)

## 2022-11-09 LAB — POCT URINALYSIS DIPSTICK OB
Blood, UA: NEGATIVE
Glucose, UA: NEGATIVE
Leukocytes, UA: NEGATIVE
Nitrite, UA: NEGATIVE

## 2022-11-09 LAB — PROTEIN / CREATININE RATIO, URINE
Creatinine, Urine: 61 mg/dL
Protein Creatinine Ratio: 0.15 mg/mg{Cre} (ref 0.00–0.15)
Total Protein, Urine: 9 mg/dL

## 2022-11-09 MED ORDER — METFORMIN HCL 500 MG PO TABS: 500.0000 mg | ORAL_TABLET | Freq: Every day | ORAL | 0 refills | Status: DC

## 2022-11-09 NOTE — Progress Notes (Addendum)
   NURSE VISIT- NST  SUBJECTIVE:  Summer Terrell is a 37 y.o. [phone] female at [redacted]w[redacted]d, here for a NST for pregnancy complicated by Diabetes: A1DM} and GHTN.  She reports active fetal movement, contractions: none, vaginal bleeding: none, membranes: intact.   OBJECTIVE:  BP 112/72   Pulse (!) 101   Wt (!) 342 lb 12.8 oz (155.5 kg)   LMP 03/06/2022 (Approximate)   BMI 52.12 kg/m   Appears well, no apparent distress  Results for orders placed or performed in visit on 11/09/22 (from the past 24 hour(s))  POC Urinalysis Dipstick OB   Collection Time: 11/09/22 10:36 AM  Result Value Ref Range   Color, UA     Clarity, UA     Glucose, UA Negative Negative   Bilirubin, UA     Ketones, UA small    Spec Grav, UA     Blood, UA negative    pH, UA     POC,PROTEIN,UA Moderate (2+) Negative, Trace, Small (1+), Moderate (2+), Large (3+), 4+   Urobilinogen, UA     Nitrite, UA negative    Leukocytes, UA Negative Negative   Appearance     Odor      NST: FHR baseline 145 bpm, Variability: moderate, Accelerations:present, Decelerations:  Absent= Cat 1/reactive Toco: none   ASSESSMENT: [id number] at [redacted]w[redacted]d with Diabetes: A1DM} and GHTN NST reactive  PLAN: EFM strip reviewed by Cathie Beams, CNM   Recommendations: keep next appointment as scheduled    Summer Terrell  11/09/2022 11:04 AM  FBS 100-115, 2hr pp 95-121, with one of 173( milkshake). Advised strongly to quit eating so many carbs.  Start metformin 500mg  q hs.

## 2022-11-09 NOTE — MAU Note (Signed)
Summer Terrell is a 37 y.o. at [redacted]w[redacted]d here in MAU reporting: Pt states she took her BP at home at 1900 tonight and it was elevated. Pt took pictures of the BP. The first BP was 160/94 and the second was 156/91. Pt denies PIH symptoms. Pt states she took 200mg  of Labetalol and 25mg  of Hydroxyzine after she took her BP. Pt has pre-e and GDM in this pregnancy. Pt denies any pain, LOF, or VB. +FM   Onset of complaint: 1900 Pain score: none  Vitals:   11/09/22 2127  BP: 138/73  Pulse: (!) 101  Resp: (!) 22  Temp: 98.6 F (37 C)  SpO2: 100%     FHT: 146 Lab orders placed from triage:  UA
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
## 2022-11-09 NOTE — Addendum Note (Signed)
Addended by: Jacklyn Shell on: 11/09/2022 11:18 AM   Modules accepted: Orders, Level of Service

## 2022-11-10 ENCOUNTER — Other Ambulatory Visit: Payer: Self-pay | Admitting: Advanced Practice Midwife

## 2022-11-10 DIAGNOSIS — O133 Gestational [pregnancy-induced] hypertension without significant proteinuria, third trimester: Secondary | ICD-10-CM

## 2022-11-10 DIAGNOSIS — O99210 Obesity complicating pregnancy, unspecified trimester: Secondary | ICD-10-CM

## 2022-11-13 ENCOUNTER — Other Ambulatory Visit (HOSPITAL_COMMUNITY)
Admission: RE | Admit: 2022-11-13 | Discharge: 2022-11-13 | Disposition: A | Discharge status: Home / Self Care | Payer: Medicaid Other | Source: Ambulatory Visit | Attending: Obstetrics & Gynecology | Admitting: Obstetrics & Gynecology

## 2022-11-13 ENCOUNTER — Ambulatory Visit (INDEPENDENT_AMBULATORY_CARE_PROVIDER_SITE_OTHER): Payer: Medicaid Other

## 2022-11-13 ENCOUNTER — Encounter: Payer: Self-pay | Admitting: Obstetrics & Gynecology

## 2022-11-13 ENCOUNTER — Other Ambulatory Visit: Payer: Self-pay | Admitting: Obstetrics & Gynecology

## 2022-11-13 ENCOUNTER — Ambulatory Visit (INDEPENDENT_AMBULATORY_CARE_PROVIDER_SITE_OTHER): Payer: Medicaid Other | Admitting: Obstetrics & Gynecology

## 2022-11-13 VITALS — BP 145/95 | HR 87 | Wt 339.0 lb

## 2022-11-13 DIAGNOSIS — O133 Gestational [pregnancy-induced] hypertension without significant proteinuria, third trimester: Secondary | ICD-10-CM

## 2022-11-13 DIAGNOSIS — Z01818 Encounter for other preprocedural examination: Secondary | ICD-10-CM

## 2022-11-13 DIAGNOSIS — O0993 Supervision of high risk pregnancy, unspecified, third trimester: Secondary | ICD-10-CM

## 2022-11-13 DIAGNOSIS — Z3A36 36 weeks gestation of pregnancy: Secondary | ICD-10-CM | POA: Diagnosis present

## 2022-11-13 DIAGNOSIS — O1493 Unspecified pre-eclampsia, third trimester: Secondary | ICD-10-CM | POA: Diagnosis not present

## 2022-11-13 DIAGNOSIS — O99210 Obesity complicating pregnancy, unspecified trimester: Secondary | ICD-10-CM | POA: Diagnosis not present

## 2022-11-13 DIAGNOSIS — O24419 Gestational diabetes mellitus in pregnancy, unspecified control: Secondary | ICD-10-CM

## 2022-11-13 MED ORDER — LABETALOL HCL 200 MG PO TABS
400.0000 mg | ORAL_TABLET | Freq: Two times a day (BID) | ORAL | 1 refills | Status: DC
Start: 2022-11-13 — End: 2022-11-22

## 2022-11-13 NOTE — Progress Notes (Signed)
[place] 36 wks,cephalic,FHR 164 BPM,BPP 8/8,posterior placenta gr 3,AFI 20 cm,RI .60,.62=67%,limited view

## 2022-11-13 NOTE — Progress Notes (Signed)
HIGH-RISK PREGNANCY VISIT Patient name: Summer Terrell MRN [id number]  Date of birth: [birth year] Chief Complaint:   Routine Prenatal Visit  History of Present Illness:   Summer Terrell is a 37 y.o. [phone] female at [redacted]w[redacted]d with an Estimated Date of Delivery: 12/11/22 being seen today for ongoing management of a high-risk pregnancy complicated by gHTN vs pre eclampsia (had 799 mg on a spot earlier, now 197) on labetalol ^400 BID today from 200 mg BID, A2DM on metformin 500 qhs.   Today she reports no complaints. Contractions: Not present. Vag. Bleeding: None.  Movement: Present. denies leaking of fluid.      05/29/2022    9:48 AM 02/04/2021    8:53 AM 11/04/2020   10:21 AM 09/24/2020   12:17 PM  Depression screen PHQ 2/9  Decreased Interest 0 0 0 1  Down, Depressed, Hopeless 0 1 1 1   PHQ - 2 Score 0 1 1 2   Altered sleeping 0 1 0 1  Tired, decreased energy 2 1 2 2   Change in appetite 0 0 0 1  Feeling bad or failure about yourself  0 0 0 0  Trouble concentrating 0 0 0 1  Moving slowly or fidgety/restless 0 0 0 0  Suicidal thoughts 0 0 0 0  PHQ-9 Score 2 3 3 7         05/29/2022    9:48 AM 02/04/2021    8:53 AM 11/04/2020   10:21 AM 09/24/2020   12:18 PM  GAD 7 : Generalized Anxiety Score  Nervous, Anxious, on Edge 2 1 1 1   Control/stop worrying 1 1 1 1   Worry too much - different things 1 1 1 1   Trouble relaxing 1 1 1 1   Restless 0 0 0 0  Easily annoyed or irritable 1 1 1 1   Afraid - awful might happen 0 0 0 0  Total GAD 7 Score 6 5 5 5      Review of Systems:   Pertinent items are noted in HPI Denies abnormal vaginal discharge w/ itching/odor/irritation, headaches, visual changes, shortness of breath, chest pain, abdominal pain, severe nausea/vomiting, or problems with urination or bowel movements unless otherwise stated above. Pertinent History Reviewed:  Reviewed past medical,surgical, social, obstetrical and family history.  Reviewed problem list, medications and  allergies. Physical Assessment:   Vitals:   11/13/22 1114 11/13/22 1119  BP: (!) 146/91 (!) 145/95  Pulse: 90 87  Weight: (!) 339 lb (153.8 kg)   Body mass index is 51.54 kg/m.           Physical Examination:   General appearance: alert, well appearing, and in no distress  Mental status: alert, oriented to person, place, and time  Skin: warm & dry   Extremities: Edema: Trace    Cardiovascular: normal heart rate noted  Respiratory: normal respiratory effort, no distress  Abdomen: gravid, soft, non-tender  Pelvic: Cervical exam deferred         Fetal Status:     Movement: Present    Fetal Surveillance Testing today: BPP 8/8 UAD 67% EFW 56% AFI 20 cm   Chaperone: Faith Rogue    No results found for this or any previous visit (from the past 24 hour(s)).  Assessment & Plan:  High-risk pregnancy: [id number] at [redacted]w[redacted]d with an Estimated Date of Delivery: 12/11/22      ICD-10-CM   1. Supervision of high risk pregnancy in third trimester  O09.93 Culture, beta strep (group b only)  Cervicovaginal ancillary only( [place])    2. Pre-eclampsia in third trimester vs gHTN  O14.93    ^labetalol 400 BID    3. Gestational diabetes mellitus, class A2  O24.419    metformin 500 qhs    4. [redacted] weeks gestation of pregnancy  Z3A.36 Culture, beta strep (group b only)    Cervicovaginal ancillary only( [place])    5. Pre-eclampsia in third trimester  O14.93         Meds: No orders of the defined types were placed in this encounter.   Orders:  Orders Placed This Encounter  Procedures   Culture, beta strep (group b only)     Labs/procedures today: U/S  Treatment Plan:  repeat section + tubal 11/20/22 scheduled    Follow-up: Return for keep scheduled.   Future Appointments  Date Time Provider Department Center  11/16/2022  9:30 AM [facility] NURSE [facility]  11/21/2022  9:15 AM [facility] [place] [facility] None  11/21/2022 10:10 AM Lazaro Arms, MD [facility]   11/27/2022 10:00 AM [facility] [place] [facility] None  11/27/2022 10:50 AM Myna Hidalgo, DO [facility]  11/30/2022  9:50 AM [facility] NURSE [facility]    Orders Placed This Encounter  Procedures   Culture, beta strep (group b only)   Lazaro Arms  Attending Physician for the Center for [facility] 11/13/2022 11:45 AM

## 2022-11-14 ENCOUNTER — Encounter (HOSPITAL_COMMUNITY): Payer: Self-pay

## 2022-11-14 LAB — CERVICOVAGINAL ANCILLARY ONLY
Chlamydia: NEGATIVE
Comment: NEGATIVE
Comment: NORMAL
Neisseria Gonorrhea: NEGATIVE

## 2022-11-14 NOTE — Patient Instructions (Addendum)
Summer Terrell  11/14/2022   Your procedure is scheduled on:  11/20/2022  Arrive at 0730 at Entrance C on [address] at [facility]  and [employer]. You are invited to use the FREE valet parking or use the Visitor's parking deck.  Pick up the phone at the desk and dial [phone].  Call this number if you have problems the morning of surgery: [phone]  Remember:   Do not eat food:(After Midnight) Desps de medianoche.  Do not drink clear liquids: (After Midnight) Desps de medianoche.  Take these medicines the morning of surgery with A SIP OF WATER:  Take labetalol as prescribed   Do not wear jewelry, make-up or nail polish.  Do not wear lotions, powders, or perfumes. Do not wear deodorant.  Do not shave 48 hours prior to surgery.  Do not bring valuables to the hospital.  [facility] is not   responsible for any belongings or valuables brought to the hospital.  Contacts, dentures or bridgework may not be worn into surgery.  Leave suitcase in the car. After surgery it may be brought to your room.  For patients admitted to the hospital, checkout time is 11:00 AM the day of              discharge.      Please read over the following fact sheets that you were given:     Preparing for Surgery

## 2022-11-16 ENCOUNTER — Ambulatory Visit (INDEPENDENT_AMBULATORY_CARE_PROVIDER_SITE_OTHER): Payer: Medicaid Other | Admitting: *Deleted

## 2022-11-16 DIAGNOSIS — Z3A36 36 weeks gestation of pregnancy: Secondary | ICD-10-CM | POA: Diagnosis not present

## 2022-11-16 DIAGNOSIS — O0993 Supervision of high risk pregnancy, unspecified, third trimester: Secondary | ICD-10-CM | POA: Diagnosis not present

## 2022-11-16 DIAGNOSIS — O24410 Gestational diabetes mellitus in pregnancy, diet controlled: Secondary | ICD-10-CM

## 2022-11-16 DIAGNOSIS — O1493 Unspecified pre-eclampsia, third trimester: Secondary | ICD-10-CM | POA: Diagnosis not present

## 2022-11-16 NOTE — Progress Notes (Addendum)
   NURSE VISIT- NST  SUBJECTIVE:  Summer Terrell is a 37 y.o. [phone] female at [redacted]w[redacted]d, here for a NST for pregnancy complicated by Diabetes: A2DM} and GHTN on Metformin. She reports active fetal movement, contractions: none, vaginal bleeding: none, membranes: intact.   OBJECTIVE:  LMP 03/06/2022 (Approximate)   Appears well, no apparent distress  No results found for this or any previous visit (from the past 24 hour(s)).  NST: FHR baseline 130 bpm, Variability: moderate, Accelerations:present, Decelerations:  Absent= Cat 1/reactive Toco: none   ASSESSMENT: [id number] at [redacted]w[redacted]d with Diabetes: A2DM} and GHTN NST reactive  PLAN: EFM strip reviewed by Cathie Beams, CNM   Recommendations: keep next appointment as scheduled    [name]  11/16/2022 1:39 PM Chart reviewed for nurse visit. Agree with plan of care.  Jacklyn Shell, [place] 11/16/2022 3:26 PM

## 2022-11-17 ENCOUNTER — Encounter (HOSPITAL_COMMUNITY)
Admission: RE | Admit: 2022-11-17 | Discharge: 2022-11-17 | Disposition: A | Discharge status: Home / Self Care | Payer: Medicaid Other | Source: Ambulatory Visit | Attending: Obstetrics & Gynecology | Admitting: Obstetrics & Gynecology

## 2022-11-17 ENCOUNTER — Inpatient Hospital Stay (HOSPITAL_COMMUNITY): Admit: 2022-11-17 | Payer: Medicaid Other

## 2022-11-17 ENCOUNTER — Other Ambulatory Visit (HOSPITAL_COMMUNITY): Admission: RE | Admit: 2022-11-17 | Payer: Medicaid Other | Source: Ambulatory Visit

## 2022-11-17 DIAGNOSIS — Z3A37 37 weeks gestation of pregnancy: Secondary | ICD-10-CM | POA: Diagnosis not present

## 2022-11-17 DIAGNOSIS — Z01818 Encounter for other preprocedural examination: Secondary | ICD-10-CM | POA: Insufficient documentation

## 2022-11-17 DIAGNOSIS — O34219 Maternal care for unspecified type scar from previous cesarean delivery: Secondary | ICD-10-CM | POA: Diagnosis not present

## 2022-11-17 HISTORY — DX: Personal history of other complications of pregnancy, childbirth and the puerperium: Z87.59

## 2022-11-17 HISTORY — DX: Personal history of other diseases of the circulatory system: Z86.79

## 2022-11-17 HISTORY — DX: Gestational diabetes mellitus in pregnancy, unspecified control: O24.419

## 2022-11-17 LAB — TYPE AND SCREEN
ABO/RH(D): B POS
Antibody Screen: NEGATIVE

## 2022-11-17 LAB — COMPREHENSIVE METABOLIC PANEL
ALT: 20 U/L (ref 0–44)
AST: 16 U/L (ref 15–41)
Albumin: 2.4 g/dL — ABNORMAL LOW (ref 3.5–5.0)
Alkaline Phosphatase: 149 U/L — ABNORMAL HIGH (ref 38–126)
Anion gap: 9 (ref 5–15)
BUN: 7 mg/dL (ref 6–20)
CO2: 21 mmol/L — ABNORMAL LOW (ref 22–32)
Calcium: 8.9 mg/dL (ref 8.9–10.3)
Chloride: 104 mmol/L (ref 98–111)
Creatinine, Ser: 0.52 mg/dL (ref 0.44–1.00)
GFR, Estimated: >60 mL/min (ref >60)
Glucose, Bld: 107 mg/dL — ABNORMAL HIGH (ref 70–99)
Potassium: 3.9 mmol/L (ref 3.5–5.1)
Sodium: 134 mmol/L — ABNORMAL LOW (ref 135–145)
Total Bilirubin: 0.5 mg/dL (ref 0.3–1.2)
Total Protein: 6.5 g/dL (ref 6.5–8.1)

## 2022-11-17 LAB — CBC
HCT: 32.4 % — ABNORMAL LOW (ref 36.0–46.0)
Hemoglobin: 10.5 g/dL — ABNORMAL LOW (ref 12.0–15.0)
MCH: 25.3 pg — ABNORMAL LOW (ref 26.0–34.0)
MCHC: 32.4 g/dL (ref 30.0–36.0)
MCV: 78.1 fL — ABNORMAL LOW (ref 80.0–100.0)
Platelets: 262 10*3/uL (ref 150–400)
RBC: 4.15 MIL/uL (ref 3.87–5.11)
RDW: 16.4 % — ABNORMAL HIGH (ref 11.5–15.5)
WBC: 10.2 10*3/uL (ref 4.0–10.5)
nRBC: 0 % (ref 0.0–0.2)

## 2022-11-17 LAB — RAPID HIV SCREEN (HIV 1/2 AB+AG)
HIV 1/2 Antibodies: NONREACTIVE
HIV-1 P24 Antigen - HIV24: NONREACTIVE

## 2022-11-17 NOTE — Patient Instructions (Signed)
TAMARIN JENNETT  11/17/2022   Your procedure is scheduled on:  11/20/2022  Arrive at 0730 at Entrance C on [address] at [facility]  and [employer]. You are invited to use the FREE valet parking or use the Visitor's parking deck.  Pick up the phone at the desk and dial [phone].  Call this number if you have problems the morning of surgery: [phone]  Remember:   Do not eat food:(After Midnight) Desps de medianoche.  Do not drink clear liquids: (After Midnight) Desps de medianoche.  Take these medicines the morning of surgery with A SIP OF WATER:  Take labetalol as prescribed   Do not wear jewelry, make-up or nail polish.  Do not wear lotions, powders, or perfumes. Do not wear deodorant.  Do not shave 48 hours prior to surgery.  Do not bring valuables to the hospital.  [facility] is not   responsible for any belongings or valuables brought to the hospital.  Contacts, dentures or bridgework may not be worn into surgery.  Leave suitcase in the car. After surgery it may be brought to your room.  For patients admitted to the hospital, checkout time is 11:00 AM the day of              discharge.      Please read over the following fact sheets that you were given:     Preparing for Surgery

## 2022-11-18 LAB — CULTURE, BETA STREP (GROUP B ONLY): Strep Gp B Culture: NEGATIVE

## 2022-11-19 ENCOUNTER — Encounter (HOSPITAL_COMMUNITY): Payer: Self-pay | Admitting: Obstetrics & Gynecology

## 2022-11-19 MED ORDER — CLINDAMYCIN PHOSPHATE 900 MG/50ML IV SOLN
900.0000 mg | INTRAVENOUS | Status: AC
  Administered 2022-11-20: 900 mg via INTRAVENOUS

## 2022-11-19 MED ORDER — GENTAMICIN SULFATE 40 MG/ML IJ SOLN
5.0000 mg/kg | INTRAVENOUS | Status: AC
  Administered 2022-11-20: 500 mg via INTRAVENOUS
  Filled 2022-11-19: qty 12.5

## 2022-11-20 ENCOUNTER — Encounter (HOSPITAL_COMMUNITY)
Admission: RE | Disposition: A | Discharge status: Home / Self Care | Payer: Self-pay | Source: Home / Self Care | Attending: Obstetrics & Gynecology

## 2022-11-20 ENCOUNTER — Other Ambulatory Visit: Payer: Self-pay

## 2022-11-20 ENCOUNTER — Inpatient Hospital Stay (HOSPITAL_COMMUNITY)
Admission: RE | Admit: 2022-11-20 | Discharge: 2022-11-22 | DRG: 788 | Disposition: A | Discharge status: Home / Self Care | Payer: Medicaid Other | Attending: Obstetrics & Gynecology | Admitting: Obstetrics & Gynecology

## 2022-11-20 ENCOUNTER — Inpatient Hospital Stay (HOSPITAL_COMMUNITY): Payer: Medicaid Other | Admitting: Anesthesiology

## 2022-11-20 ENCOUNTER — Encounter (HOSPITAL_COMMUNITY): Payer: Self-pay | Admitting: Obstetrics & Gynecology

## 2022-11-20 DIAGNOSIS — Z7982 Long term (current) use of aspirin: Secondary | ICD-10-CM | POA: Diagnosis not present

## 2022-11-20 DIAGNOSIS — O34211 Maternal care for low transverse scar from previous cesarean delivery: Principal | ICD-10-CM | POA: Diagnosis present

## 2022-11-20 DIAGNOSIS — O134 Gestational [pregnancy-induced] hypertension without significant proteinuria, complicating childbirth: Secondary | ICD-10-CM

## 2022-11-20 DIAGNOSIS — Z3A37 37 weeks gestation of pregnancy: Secondary | ICD-10-CM | POA: Diagnosis not present

## 2022-11-20 DIAGNOSIS — O1404 Mild to moderate pre-eclampsia, complicating childbirth: Secondary | ICD-10-CM | POA: Diagnosis present

## 2022-11-20 DIAGNOSIS — O24419 Gestational diabetes mellitus in pregnancy, unspecified control: Secondary | ICD-10-CM | POA: Diagnosis present

## 2022-11-20 DIAGNOSIS — O9902 Anemia complicating childbirth: Secondary | ICD-10-CM | POA: Diagnosis present

## 2022-11-20 DIAGNOSIS — O24424 Gestational diabetes mellitus in childbirth, insulin controlled: Secondary | ICD-10-CM | POA: Diagnosis not present

## 2022-11-20 DIAGNOSIS — O0990 Supervision of high risk pregnancy, unspecified, unspecified trimester: Secondary | ICD-10-CM

## 2022-11-20 DIAGNOSIS — Z8632 Personal history of gestational diabetes: Secondary | ICD-10-CM | POA: Diagnosis present

## 2022-11-20 DIAGNOSIS — Z98891 History of uterine scar from previous surgery: Principal | ICD-10-CM

## 2022-11-20 DIAGNOSIS — O24410 Gestational diabetes mellitus in pregnancy, diet controlled: Secondary | ICD-10-CM | POA: Diagnosis present

## 2022-11-20 DIAGNOSIS — O99214 Obesity complicating childbirth: Secondary | ICD-10-CM | POA: Diagnosis present

## 2022-11-20 DIAGNOSIS — O1490 Unspecified pre-eclampsia, unspecified trimester: Secondary | ICD-10-CM | POA: Diagnosis present

## 2022-11-20 DIAGNOSIS — Z87891 Personal history of nicotine dependence: Secondary | ICD-10-CM

## 2022-11-20 DIAGNOSIS — O09299 Supervision of pregnancy with other poor reproductive or obstetric history, unspecified trimester: Secondary | ICD-10-CM

## 2022-11-20 LAB — CBC
HCT: 30.3 % — ABNORMAL LOW (ref 36.0–46.0)
Hemoglobin: 9.6 g/dL — ABNORMAL LOW (ref 12.0–15.0)
MCH: 24.9 pg — ABNORMAL LOW (ref 26.0–34.0)
MCHC: 31.7 g/dL (ref 30.0–36.0)
MCV: 78.5 fL — ABNORMAL LOW (ref 80.0–100.0)
Platelets: 246 10*3/uL (ref 150–400)
RBC: 3.86 MIL/uL — ABNORMAL LOW (ref 3.87–5.11)
RDW: 16.5 % — ABNORMAL HIGH (ref 11.5–15.5)
WBC: 11.4 10*3/uL — ABNORMAL HIGH (ref 4.0–10.5)
nRBC: 0 % (ref 0.0–0.2)

## 2022-11-20 LAB — RPR: RPR Ser Ql: NONREACTIVE

## 2022-11-20 LAB — GLUCOSE, CAPILLARY
Glucose-Capillary: 108 mg/dL — ABNORMAL HIGH (ref 70–99)
Glucose-Capillary: 90 mg/dL (ref 70–99)

## 2022-11-20 LAB — CREATININE, SERUM
Creatinine, Ser: 0.58 mg/dL (ref 0.44–1.00)
GFR, Estimated: >60 mL/min (ref >60)

## 2022-11-20 SURGERY — Surgical Case
Anesthesia: Spinal

## 2022-11-20 MED ORDER — ONDANSETRON HCL 4 MG/2ML IJ SOLN
INTRAMUSCULAR | Status: AC
  Filled 2022-11-20: qty 2

## 2022-11-20 MED ORDER — POVIDONE-IODINE 10 % EX SWAB
2.0000 | Freq: Once | CUTANEOUS | Status: AC
  Administered 2022-11-20: 2 via TOPICAL

## 2022-11-20 MED ORDER — DIPHENHYDRAMINE HCL 50 MG/ML IJ SOLN
INTRAMUSCULAR | Status: DC | PRN
  Administered 2022-11-20 (×2): 12.5 mg via INTRAVENOUS

## 2022-11-20 MED ORDER — SCOPOLAMINE 1 MG/3DAYS TD PT72
MEDICATED_PATCH | TRANSDERMAL | Status: AC
  Filled 2022-11-20: qty 1

## 2022-11-20 MED ORDER — DIBUCAINE (PERIANAL) 1 % EX OINT: 1.0000 | TOPICAL_OINTMENT | CUTANEOUS | Status: DC | PRN

## 2022-11-20 MED ORDER — TRANEXAMIC ACID-NACL 1000-0.7 MG/100ML-% IV SOLN
INTRAVENOUS | Status: AC
  Filled 2022-11-20: qty 100

## 2022-11-20 MED ORDER — CLINDAMYCIN PHOSPHATE 900 MG/50ML IV SOLN
INTRAVENOUS | Status: AC
  Filled 2022-11-20: qty 50

## 2022-11-20 MED ORDER — IBUPROFEN 600 MG PO TABS
600.0000 mg | ORAL_TABLET | Freq: Four times a day (QID) | ORAL | Status: DC | PRN
  Administered 2022-11-20: 600 mg via ORAL
  Filled 2022-11-20: qty 1

## 2022-11-20 MED ORDER — PRENATAL MULTIVITAMIN CH
1.0000 | ORAL_TABLET | Freq: Every day | ORAL | Status: DC
  Administered 2022-11-21: 1 via ORAL
  Filled 2022-11-20 (×2): qty 1

## 2022-11-20 MED ORDER — PROMETHAZINE HCL 25 MG/ML IJ SOLN: 6.2500 mg | INTRAMUSCULAR | Status: DC | PRN

## 2022-11-20 MED ORDER — TRANEXAMIC ACID-NACL 1000-0.7 MG/100ML-% IV SOLN
1000.0000 mg | INTRAVENOUS | Status: AC
  Administered 2022-11-20: 1000 mg via INTRAVENOUS

## 2022-11-20 MED ORDER — SIMETHICONE 80 MG PO CHEW
80.0000 mg | CHEWABLE_TABLET | Freq: Three times a day (TID) | ORAL | Status: DC
  Administered 2022-11-20 – 2022-11-22 (×5): 80 mg via ORAL
  Filled 2022-11-20 (×5): qty 1

## 2022-11-20 MED ORDER — DIPHENHYDRAMINE HCL 50 MG/ML IJ SOLN
INTRAMUSCULAR | Status: AC
  Filled 2022-11-20: qty 1

## 2022-11-20 MED ORDER — IBUPROFEN 600 MG PO TABS
600.0000 mg | ORAL_TABLET | Freq: Four times a day (QID) | ORAL | Status: DC
  Administered 2022-11-20 – 2022-11-22 (×7): 600 mg via ORAL
  Filled 2022-11-20 (×7): qty 1

## 2022-11-20 MED ORDER — OXYTOCIN-SODIUM CHLORIDE 30-0.9 UT/500ML-% IV SOLN
INTRAVENOUS | Status: AC
  Filled 2022-11-20: qty 500

## 2022-11-20 MED ORDER — OXYTOCIN-SODIUM CHLORIDE 30-0.9 UT/500ML-% IV SOLN
INTRAVENOUS | Status: DC | PRN
  Administered 2022-11-20: 300 mL via INTRAVENOUS

## 2022-11-20 MED ORDER — SENNOSIDES-DOCUSATE SODIUM 8.6-50 MG PO TABS
2.0000 | ORAL_TABLET | Freq: Every day | ORAL | Status: DC
  Administered 2022-11-21 – 2022-11-22 (×2): 2 via ORAL
  Filled 2022-11-20 (×2): qty 2

## 2022-11-20 MED ORDER — NALOXONE HCL 0.4 MG/ML IJ SOLN: 0.4000 mg | INTRAMUSCULAR | Status: DC | PRN

## 2022-11-20 MED ORDER — DIPHENHYDRAMINE HCL 25 MG PO CAPS: 25.0000 mg | ORAL_CAPSULE | ORAL | Status: DC | PRN

## 2022-11-20 MED ORDER — HYDROMORPHONE HCL 1 MG/ML IJ SOLN: 0.2500 mg | INTRAMUSCULAR | Status: DC | PRN

## 2022-11-20 MED ORDER — DIPHENHYDRAMINE HCL 50 MG/ML IJ SOLN
25.0000 mg | Freq: Once | INTRAMUSCULAR | Status: AC
  Administered 2022-11-20: 25 mg via INTRAVENOUS

## 2022-11-20 MED ORDER — FENTANYL CITRATE (PF) 100 MCG/2ML IJ SOLN
INTRAMUSCULAR | Status: AC
  Filled 2022-11-20: qty 2

## 2022-11-20 MED ORDER — MENTHOL 3 MG MT LOZG: 1.0000 | LOZENGE | OROMUCOSAL | Status: DC | PRN

## 2022-11-20 MED ORDER — PHENYLEPHRINE 80 MCG/ML (10ML) SYRINGE FOR IV PUSH (FOR BLOOD PRESSURE SUPPORT)
PREFILLED_SYRINGE | INTRAVENOUS | Status: AC
  Filled 2022-11-20: qty 10

## 2022-11-20 MED ORDER — MEPERIDINE HCL 25 MG/ML IJ SOLN: 6.2500 mg | INTRAMUSCULAR | Status: DC | PRN

## 2022-11-20 MED ORDER — DIPHENHYDRAMINE HCL 50 MG/ML IJ SOLN: 12.5000 mg | INTRAMUSCULAR | Status: DC | PRN

## 2022-11-20 MED ORDER — SIMETHICONE 80 MG PO CHEW: 80.0000 mg | CHEWABLE_TABLET | ORAL | Status: DC | PRN

## 2022-11-20 MED ORDER — DIPHENHYDRAMINE HCL 25 MG PO CAPS: 25.0000 mg | ORAL_CAPSULE | Freq: Four times a day (QID) | ORAL | Status: DC | PRN

## 2022-11-20 MED ORDER — PHENYLEPHRINE HCL (PRESSORS) 10 MG/ML IV SOLN
INTRAVENOUS | Status: DC | PRN
  Administered 2022-11-20: 40 ug via INTRAVENOUS

## 2022-11-20 MED ORDER — NALOXONE HCL 4 MG/10ML IJ SOLN: 1.0000 ug/kg/h | INTRAVENOUS | Status: DC | PRN

## 2022-11-20 MED ORDER — ENOXAPARIN SODIUM 80 MG/0.8ML IJ SOSY
80.0000 mg | PREFILLED_SYRINGE | INTRAMUSCULAR | Status: DC
  Administered 2022-11-21 – 2022-11-22 (×2): 80 mg via SUBCUTANEOUS
  Filled 2022-11-20 (×2): qty 0.8

## 2022-11-20 MED ORDER — ACETAMINOPHEN 500 MG PO TABS
1000.0000 mg | ORAL_TABLET | Freq: Once | ORAL | Status: AC
  Administered 2022-11-20: 1000 mg via ORAL

## 2022-11-20 MED ORDER — MORPHINE SULFATE (PF) 0.5 MG/ML IJ SOLN
INTRAMUSCULAR | Status: DC | PRN
  Administered 2022-11-20: 150 ug via INTRATHECAL

## 2022-11-20 MED ORDER — FENTANYL CITRATE (PF) 100 MCG/2ML IJ SOLN: 50.0000 ug | INTRAMUSCULAR | Status: DC | PRN

## 2022-11-20 MED ORDER — SODIUM CHLORIDE 0.9% FLUSH: 3.0000 mL | INTRAVENOUS | Status: DC | PRN

## 2022-11-20 MED ORDER — ACETAMINOPHEN 500 MG PO TABS
ORAL_TABLET | ORAL | Status: AC
  Filled 2022-11-20: qty 2

## 2022-11-20 MED ORDER — SCOPOLAMINE 1 MG/3DAYS TD PT72
1.0000 | MEDICATED_PATCH | TRANSDERMAL | Status: DC
  Administered 2022-11-20: 1.5 mg via TRANSDERMAL

## 2022-11-20 MED ORDER — PHENYLEPHRINE HCL-NACL 20-0.9 MG/250ML-% IV SOLN
INTRAVENOUS | Status: DC | PRN
  Administered 2022-11-20: 30 ug/min via INTRAVENOUS

## 2022-11-20 MED ORDER — SCOPOLAMINE 1 MG/3DAYS TD PT72: 1.0000 | MEDICATED_PATCH | Freq: Once | TRANSDERMAL | Status: DC

## 2022-11-20 MED ORDER — LACTATED RINGERS IV SOLN: INTRAVENOUS | Status: DC

## 2022-11-20 MED ORDER — MORPHINE SULFATE (PF) 0.5 MG/ML IJ SOLN
INTRAMUSCULAR | Status: AC
  Filled 2022-11-20: qty 10

## 2022-11-20 MED ORDER — ACETAMINOPHEN 500 MG PO TABS
1000.0000 mg | ORAL_TABLET | Freq: Four times a day (QID) | ORAL | Status: DC
  Administered 2022-11-20 – 2022-11-22 (×6): 1000 mg via ORAL
  Filled 2022-11-20 (×6): qty 2

## 2022-11-20 MED ORDER — OXYCODONE HCL 5 MG PO TABS
5.0000 mg | ORAL_TABLET | ORAL | Status: DC | PRN
  Administered 2022-11-21: 5 mg via ORAL
  Administered 2022-11-21: 10 mg via ORAL
  Administered 2022-11-22 (×2): 5 mg via ORAL
  Filled 2022-11-20: qty 1
  Filled 2022-11-20: qty 2
  Filled 2022-11-20 (×2): qty 1

## 2022-11-20 MED ORDER — COCONUT OIL OIL: 1.0000 | TOPICAL_OIL | Status: DC | PRN

## 2022-11-20 MED ORDER — ZOLPIDEM TARTRATE 5 MG PO TABS: 5.0000 mg | ORAL_TABLET | Freq: Every evening | ORAL | Status: DC | PRN

## 2022-11-20 MED ORDER — FENTANYL CITRATE (PF) 100 MCG/2ML IJ SOLN
INTRAMUSCULAR | Status: DC | PRN
  Administered 2022-11-20: 15 ug via INTRATHECAL

## 2022-11-20 MED ORDER — ONDANSETRON HCL 4 MG/2ML IJ SOLN: 4.0000 mg | Freq: Three times a day (TID) | INTRAMUSCULAR | Status: DC | PRN

## 2022-11-20 MED ORDER — GABAPENTIN 100 MG PO CAPS
100.0000 mg | ORAL_CAPSULE | Freq: Two times a day (BID) | ORAL | Status: DC
  Administered 2022-11-20 – 2022-11-22 (×4): 100 mg via ORAL
  Filled 2022-11-20 (×4): qty 1

## 2022-11-20 MED ORDER — BUPIVACAINE IN DEXTROSE 0.75-8.25 % IT SOLN
INTRATHECAL | Status: DC | PRN
  Administered 2022-11-20: 1.7 mL via INTRATHECAL

## 2022-11-20 MED ORDER — OXYTOCIN-SODIUM CHLORIDE 30-0.9 UT/500ML-% IV SOLN
2.5000 [IU]/h | INTRAVENOUS | Status: AC
  Administered 2022-11-20: 2.5 [IU]/h via INTRAVENOUS

## 2022-11-20 MED ORDER — ONDANSETRON HCL 4 MG/2ML IJ SOLN
INTRAMUSCULAR | Status: DC | PRN
  Administered 2022-11-20: 4 mg via INTRAVENOUS

## 2022-11-20 MED ORDER — BUPIVACAINE HCL (PF) 0.5 % IJ SOLN
INTRAMUSCULAR | Status: AC
  Filled 2022-11-20: qty 30

## 2022-11-20 MED ORDER — WITCH HAZEL-GLYCERIN EX PADS: 1.0000 | MEDICATED_PAD | CUTANEOUS | Status: DC | PRN

## 2022-11-20 SURGICAL SUPPLY — 34 items
APL PRP STRL LF DISP 70% ISPRP (MISCELLANEOUS) ×4
BARRIER ADHS 3X4 INTERCEED (GAUZE/BANDAGES/DRESSINGS) IMPLANT
BRR ADH 4X3 ABS CNTRL BYND (GAUZE/BANDAGES/DRESSINGS)
CHLORAPREP W/TINT 26 (MISCELLANEOUS) ×4 IMPLANT
CLAMP UMBILICAL CORD (MISCELLANEOUS) ×2 IMPLANT
CLIP FILSHIE TUBAL LIGA STRL (Clip) IMPLANT
CLOTH BEACON ORANGE TIMEOUT ST (SAFETY) ×2 IMPLANT
DRESSING PREVENA PLUS CUSTOM (GAUZE/BANDAGES/DRESSINGS) ×1 IMPLANT
DRSG OPSITE POSTOP 4X10 (GAUZE/BANDAGES/DRESSINGS) ×2 IMPLANT
DRSG PREVENA PLUS CUSTOM (GAUZE/BANDAGES/DRESSINGS) ×2
ELECT REM PT RETURN 9FT ADLT (ELECTROSURGICAL) ×2
ELECTRODE REM PT RTRN 9FT ADLT (ELECTROSURGICAL) ×2 IMPLANT
EXTRACTOR VACUUM KIWI (MISCELLANEOUS) IMPLANT
GLOVE BIO SURGEON STRL SZ 6.5 (GLOVE) ×2 IMPLANT
GLOVE BIOGEL PI IND STRL 7.0 (GLOVE) ×4 IMPLANT
GOWN STRL REUS W/TWL LRG LVL3 (GOWN DISPOSABLE) ×4 IMPLANT
KIT ABG SYR 3ML LUER SLIP (SYRINGE) IMPLANT
MAT PREVALON FULL STRYKER (MISCELLANEOUS) ×1 IMPLANT
NDL HYPO 25X5/8 SAFETYGLIDE (NEEDLE) IMPLANT
NEEDLE HYPO 22GX1.5 SAFETY (NEEDLE) IMPLANT
NEEDLE HYPO 25X5/8 SAFETYGLIDE (NEEDLE) IMPLANT
NS IRRIG 1000ML POUR BTL (IV SOLUTION) ×2 IMPLANT
PACK C SECTION WH (CUSTOM PROCEDURE TRAY) ×2 IMPLANT
PAD OB MATERNITY 4.3X12.25 (PERSONAL CARE ITEMS) ×2 IMPLANT
RTRCTR C-SECT PINK 25CM LRG (MISCELLANEOUS) IMPLANT
SUT PLAIN 2 0 XLH (SUTURE) ×1 IMPLANT
SUT VIC AB 0 CT1 36 (SUTURE) ×12 IMPLANT
SUT VIC AB 2-0 CT1 27 (SUTURE) ×2
SUT VIC AB 2-0 CT1 TAPERPNT 27 (SUTURE) ×2 IMPLANT
SUT VIC AB 4-0 PS2 27 (SUTURE) ×2 IMPLANT
SYR CONTROL 10ML LL (SYRINGE) IMPLANT
TOWEL OR 17X24 6PK STRL BLUE (TOWEL DISPOSABLE) ×2 IMPLANT
TRAY FOLEY W/BAG SLVR 14FR LF (SET/KITS/TRAYS/PACK) IMPLANT
WATER STERILE IRR 1000ML POUR (IV SOLUTION) ×2 IMPLANT
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
## 2022-11-20 NOTE — Op Note (Signed)
Summer Terrell PROCEDURE DATE: 11/20/2022  PREOPERATIVE DIAGNOSES: Intrauterine pregnancy at [redacted]w[redacted]d weeks gestation;  gestational hypertension, previous cesarean section x2  POSTOPERATIVE DIAGNOSES: The same  PROCEDURE: Low Transverse Cesarean Section  SURGEON: Adam Phenix, MD  ASSISTANT:  Dr. Lenward Chancellor experienced assistant was required given the standard of surgical care given the complexity of the case.  This assistant was needed for exposure, dissection, suctioning, retraction, instrument exchange, assisting with delivery with administration of fundal pressure, and for overall help during the procedure.  ANESTHESIOLOGY TEAM: Anesthesiologist: Lewie Loron, MD CRNA: Elbert Ewings, CRNA  INDICATIONS: Summer Terrell is a 37 y.o. [phone] at [redacted]w[redacted]d here for cesarean section secondary to the indications listed under preoperative diagnoses; please see preoperative note for further details.  The risks of surgery were discussed with the patient including but were not limited to: bleeding which may require transfusion or reoperation; infection which may require antibiotics; injury to bowel, bladder, ureters or other surrounding organs; injury to the fetus; need for additional procedures including hysterectomy in the event of a life-threatening hemorrhage; formation of adhesions; placental abnormalities wth subsequent pregnancies; incisional problems; thromboembolic phenomenon and other postoperative/anesthesia complications.  The patient concurred with the proposed plan, giving informed written consent for the procedure.    FINDINGS:  Viable female infant in cephalic presentation.  Apgars 8 and 9.  Clear amniotic fluid.  Intact placenta, three vessel cord.  Normal uterus, fallopian tubes and ovaries bilaterally.  ANESTHESIA: Spinal  INTRAVENOUS FLUIDS: 1000 ml   ESTIMATED BLOOD LOSS: 600 ml URINE OUTPUT:  100 ml SPECIMENS: Placenta sent to L&D COMPLICATIONS: None  immediate  PROCEDURE IN DETAIL:  The patient preoperatively received intravenous antibiotics and had sequential compression devices applied to her lower extremities.  She was then taken to the operating room where spinal anesthesia was administered and was found to be adequate. She was then placed in a dorsal supine position with a leftward tilt, and prepped and draped in a sterile manner.  A foley catheter was placed into her bladder and attached to constant gravity.  After an adequate timeout was performed, a Pfannenstiel skin incision was made with scalpel on her preexisting scar and carried through to the underlying layer of fascia. The subcutaneous layer was about 9 cm deep.The fascia was incised in the midline, and this incision was extended bilaterally in a blunt fashion.  The underlying rectus muscles were dissected off the fascia superiorly and inferiorly in a blunt fashion. The rectus muscles were separated in the midline and the peritoneum was entered bluntly. The Alexis self-retaining retractor was introduced into the abdominal cavity.  Attention was turned to the lower uterine segment where a low transverse hysterotomy was made with a scalpel and extended bilaterally bluntly.  The infant was successfully delivered, the cord was clamped and cut after one minute, and the infant was handed over to the awaiting neonatology team. Uterine massage was then administered, and the placenta delivered intact with a three-vessel cord. The uterus was then cleared of clots and debris.  The hysterotomy was closed with 0 Vicryl in a running locked fashion,  The pelvis was cleared of all clot and debris. Hemostasis was confirmed on all surfaces.  The retractor was removed.  The peritoneum was closed with a 2-0 Vicryl running stitch. The fascia was then closed using 0 Vicryl in a running fashion.  The subcutaneous layer was irrigated, reapproximated with 2-0 plain gut interrupted stitches, and the skin was closed with a  4-0 Vicryl  subcuticular stitch. A Prevena vacuum dressing was applied. The patient tolerated the procedure well. Sponge, instrument and needle counts were correct x 3.  She was taken to the recovery room in stable condition.    Adam Phenix, MD Obstetrician & Gynecologist, [facility] for [facility], [facility] 11/20/2022 10:39 AM
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
## 2022-11-20 NOTE — Transfer of Care (Signed)
Immediate Anesthesia Transfer of Care Note  Patient: Summer Terrell  Procedure(s) Performed: CESAREAN SECTION  Patient Location: PACU  Anesthesia Type:Spinal  Level of Consciousness: awake, alert , and oriented  Airway & Oxygen Therapy: Patient Spontanous Breathing  Post-op Assessment: Report given to RN and Post -op Vital signs reviewed and stable  Post vital signs: Reviewed and stable  Last Vitals:  Vitals Value Taken Time  BP 142/77 11/20/22 1109  Temp    Pulse 72 11/20/22 1112  Resp 18 11/20/22 1112  SpO2 97 % 11/20/22 1112  Vitals shown include unvalidated device data.  Last Pain: There were no vitals filed for this visit.       Complications: No notable events documented.

## 2022-11-20 NOTE — Anesthesia Procedure Notes (Signed)
Spinal  Patient location during procedure: OR Start time: 11/20/2022 8:52 AM End time: 11/20/2022 9:09 AM Reason for block: surgical anesthesia Staffing Performed: anesthesiologist  Anesthesiologist: Lewie Loron, MD Performed by: Lewie Loron, MD Authorized by: Lewie Loron, MD   Preanesthetic Checklist Completed: patient identified, IV checked, site marked, risks and benefits discussed, surgical consent, monitors and equipment checked, pre-op evaluation and timeout performed Spinal Block Patient position: sitting Prep: DuraPrep and site prepped and draped Patient monitoring: heart rate, continuous pulse ox and blood pressure Approach: midline Location: L3-4 Injection technique: single-shot Needle Needle type: Spinocan  Needle gauge: 25 G Needle length: 9 cm Assessment Events: CSF return Additional Notes Expiration date of kit checked and confirmed. Patient tolerated procedure well, without complications.

## 2022-11-20 NOTE — Anesthesia Postprocedure Evaluation (Signed)
Anesthesia Post Note  Patient: Summer Terrell  Procedure(s) Performed: CESAREAN SECTION     Patient location during evaluation: PACU Anesthesia Type: Spinal Level of consciousness: awake and alert Pain management: pain level controlled Vital Signs Assessment: post-procedure vital signs reviewed and stable Respiratory status: spontaneous breathing Cardiovascular status: stable Anesthetic complications: no   No notable events documented.  Last Vitals:  Vitals:   11/20/22 1803 11/20/22 2138  BP: 130/73 131/77  Pulse: 82 96  Resp:  16  Temp:  36.9 C  SpO2: 98%     Last Pain:  Vitals:   11/20/22 2138  TempSrc: Oral  PainSc: 7                  Lewie Loron

## 2022-11-20 NOTE — Anesthesia Preprocedure Evaluation (Addendum)
Anesthesia Evaluation  Patient identified by MRN, date of birth, ID band Patient awake    Reviewed: Allergy & Precautions, NPO status , Patient's Chart, lab work & pertinent test results  Airway Mallampati: III  TM Distance: >3 FB Neck ROM: Full    Dental no notable dental hx. (+) Dental Advisory Given, Teeth Intact   Pulmonary asthma , former smoker   Pulmonary exam normal breath sounds clear to auscultation       Cardiovascular hypertension, Pt. on medications and Pt. on home beta blockers Normal cardiovascular exam Rhythm:Regular Rate:Normal     Neuro/Psych  PSYCHIATRIC DISORDERS Anxiety Depression Bipolar Disorder    Neuromuscular disease    GI/Hepatic negative GI ROS, Neg liver ROS,,,  Endo/Other  diabetes  Morbid obesity  Renal/GU negative Renal ROS     Musculoskeletal negative musculoskeletal ROS (+)    Abdominal  (+) + obese  Peds  Hematology negative hematology ROS (+)   Anesthesia Other Findings   Reproductive/Obstetrics (+) Pregnancy                             Anesthesia Physical Anesthesia Plan  ASA: 4  Anesthesia Plan: Spinal   Post-op Pain Management: Tylenol PO (pre-op)*   Induction: Intravenous  PONV Risk Score and Plan: 4 or greater and Treatment may vary due to age or medical condition, Ondansetron, Dexamethasone and Scopolamine patch - Pre-op  Airway Management Planned: Natural Airway  Additional Equipment:   Intra-op Plan:   Post-operative Plan:   Informed Consent: I have reviewed the patients History and Physical, chart, labs and discussed the procedure including the risks, benefits and alternatives for the proposed anesthesia with the patient or authorized representative who has indicated his/her understanding and acceptance.     Dental advisory given  Plan Discussed with: CRNA  Anesthesia Plan Comments:         Anesthesia Quick  Evaluation

## 2022-11-20 NOTE — Discharge Summary (Shared)
Postpartum Discharge Summary  Date of Service updated***     Patient Name: Summer Terrell DOB: [birth year] MRN: [id number]  Date of admission: 11/20/2022 Delivery date:11/20/2022  Delivering provider: Adam Phenix  Date of discharge: 11/20/2022  Admitting diagnosis: S/P cesarean section [Z98.891] Intrauterine pregnancy: [redacted]w[redacted]d     Secondary diagnosis:  Principal Problem:   S/P cesarean section Active Problems:   History of 2 cesarean sections   Maternal morbid obesity, antepartum (HCC)   Hx of preeclampsia, prior pregnancy, currently pregnant   High-risk pregnancy   Preeclampsia   Gestational diabetes  Additional problems: maternal obesity    Discharge diagnosis: Term Pregnancy Delivered, Gestational Hypertension, and GDM A1                                              Post partum procedures:{Postpartum procedures:23558} Augmentation: {Augmentation:20782} Complications: None  Hospital course: Sceduled C/S   37 y.o. yo [id number] at [redacted]w[redacted]d was admitted to the hospital 11/20/2022 for scheduled cesarean section with the following indication:Elective Repeat.Delivery details are as follows:  Membrane Rupture Time/Date: 9:46 AM ,11/20/2022   Delivery Method:C-Section, Low Transverse  Details of operation can be found in separate operative note.  Patient had a postpartum course complicated by***.  She is ambulating, tolerating a regular diet, passing flatus, and urinating well. Patient is discharged home in stable condition on  11/20/22        Newborn Data: Birth date:11/20/2022  Birth time:9:47 AM  Gender:Female  Living status:Living  Apgars:8 ,8  Weight:3040 g     Magnesium Sulfate received: {Mag received:30440022} BMZ received: {BMZ received:30440023} Rhophylac:{Rhophylac received:30440032} JYN:{WGN:56213086} T-DaP:{Tdap:23962} Flu: {VHQ:46962} Transfusion:{Transfusion received:30440034}  Physical exam  Vitals:   11/20/22 0733 11/20/22 0744 11/20/22 0747  BP: 120/77  120/77   Pulse:   90  Resp:  18   Temp:  98.3 F (36.8 C)   SpO2:   96%  Weight:  (!) 153.8 kg   Height:  5\' 8"  (1.727 m)    General: {Exam; general:21111117} Lochia: {Desc; appropriate/inappropriate:30686::"appropriate"} Uterine Fundus: {Desc; firm/soft:30687} Incision: {Exam; incision:21111123} DVT Evaluation: {Exam; dvt:2111122} Labs: Lab Results  Component Value Date   WBC 10.2 11/17/2022   HGB 10.5 (L) 11/17/2022   HCT 32.4 (L) 11/17/2022   MCV 78.1 (L) 11/17/2022   PLT 262 11/17/2022      Latest Ref Rng & Units 11/17/2022   10:35 AM  CMP  Glucose 70 - 99 mg/dL 952   BUN 6 - 20 mg/dL 7   Creatinine 8.41 - 3.24 mg/dL 4.01   Sodium 027 - 253 mmol/L 134   Potassium 3.5 - 5.1 mmol/L 3.9   Chloride 98 - 111 mmol/L 104   CO2 22 - 32 mmol/L 21   Calcium 8.9 - 10.3 mg/dL 8.9   Total Protein 6.5 - 8.1 g/dL 6.5   Total Bilirubin 0.3 - 1.2 mg/dL 0.5   Alkaline Phos 38 - 126 U/L 149   AST 15 - 41 U/L 16   ALT 0 - 44 U/L 20    Edinburgh Score:    05/24/2021   10:01 AM  Edinburgh Postnatal Depression Scale Screening Tool  I have been able to laugh and see the funny side of things. 1  I have looked forward with enjoyment to things. 0  I have blamed myself unnecessarily when things went wrong. 1  I have  been anxious or worried for no good reason. 3  I have felt scared or panicky for no good reason. 2  Things have been getting on top of me. 2  I have been so unhappy that I have had difficulty sleeping. 0  I have felt sad or miserable. 0  I have been so unhappy that I have been crying. 1  The thought of harming myself has occurred to me. 0  Edinburgh Postnatal Depression Scale Total 10     After visit meds:  Allergies as of 11/20/2022       Reactions   Rocephin [ceftriaxone Sodium In Dextrose] Anaphylaxis, Hives, Shortness Of Breath     Med Rec must be completed prior to using this [facility]***        Discharge home in stable condition Infant Feeding:  {Baby feeding:23562} Infant Disposition:{CHL IP OB HOME WITH ZOXWRU:04540} Discharge instruction: per After Visit Summary and Postpartum booklet. Activity: Advance as tolerated. Pelvic rest for 6 weeks.  Diet: {OB JWJX:91478295} Future Appointments:No future appointments. Follow up Visit:   Please schedule this patient for a {Visit type:23955} postpartum visit in {Postpartum visit:23953} with the following provider: {Provider type:23954}. Additional Postpartum F/U:{PP Procedure:23957}  {Risk level:23960} pregnancy complicated by: {complication:23959} Delivery mode:  C-Section, Low Transverse  Anticipated Birth Control:  {Birth Control:23956}   11/20/2022 Scheryl Darter, MD

## 2022-11-21 ENCOUNTER — Encounter: Payer: Medicaid Other | Admitting: Obstetrics & Gynecology

## 2022-11-21 ENCOUNTER — Other Ambulatory Visit: Payer: Medicaid Other

## 2022-11-21 LAB — CBC
HCT: 29 % — ABNORMAL LOW (ref 36.0–46.0)
Hemoglobin: 9.1 g/dL — ABNORMAL LOW (ref 12.0–15.0)
MCH: 24.5 pg — ABNORMAL LOW (ref 26.0–34.0)
MCHC: 31.4 g/dL (ref 30.0–36.0)
MCV: 78.2 fL — ABNORMAL LOW (ref 80.0–100.0)
Platelets: 228 10*3/uL (ref 150–400)
RBC: 3.71 MIL/uL — ABNORMAL LOW (ref 3.87–5.11)
RDW: 16.6 % — ABNORMAL HIGH (ref 11.5–15.5)
WBC: 9.5 10*3/uL (ref 4.0–10.5)
nRBC: 0 % (ref 0.0–0.2)

## 2022-11-21 LAB — GLUCOSE, CAPILLARY: Glucose-Capillary: 111 mg/dL — ABNORMAL HIGH (ref 70–99)

## 2022-11-21 MED ORDER — NIFEDIPINE ER OSMOTIC RELEASE 30 MG PO TB24: 30.0000 mg | ORAL_TABLET | Freq: Every day | ORAL | Status: DC

## 2022-11-21 MED ORDER — FUROSEMIDE 20 MG PO TABS
20.0000 mg | ORAL_TABLET | Freq: Every day | ORAL | Status: DC
  Administered 2022-11-21 – 2022-11-22 (×2): 20 mg via ORAL
  Filled 2022-11-21 (×2): qty 1

## 2022-11-21 MED ORDER — AMLODIPINE BESYLATE 5 MG PO TABS
5.0000 mg | ORAL_TABLET | Freq: Every day | ORAL | Status: DC
  Administered 2022-11-21 – 2022-11-22 (×2): 5 mg via ORAL
  Filled 2022-11-21 (×2): qty 1

## 2022-11-21 NOTE — Progress Notes (Signed)
B/P 135/77 at 1300. Rechecked and B/P was 133/85 at 1626. Patient asymptomatic. Attempted to notify Philipp Deputy, CNM but she was in a delivery. Asked L&D RN to have her  call RN on MBU when she was available.

## 2022-11-21 NOTE — Progress Notes (Signed)
POSTPARTUM PROGRESS NOTE  POD #1  Subjective:  Summer Terrell is a 37 y.o. [phone] s/p rLTCS at [redacted]w[redacted]d.  She reports she doing well. No acute events overnight. She reports she is doing well. She denies any problems with ambulating, voiding or po intake. Denies nausea or vomiting. She has passed flatus. Pain is moderately controlled.  Lochia is appropriate.  Objective: Blood pressure 129/73, pulse 74, temperature 97.6 F (36.4 C), temperature source Oral, resp. rate 16, height 5\' 8"  (1.727 m), weight (!) 153.8 kg, last menstrual period 03/06/2022, SpO2 98 %, unknown if currently breastfeeding.  Physical Exam:  General: alert, cooperative and no distress Chest: no respiratory distress Heart:regular rate, distal pulses intact Abdomen: soft, nontender,  Uterine Fundus: firm, appropriately tender DVT Evaluation: No calf swelling or tenderness Extremities: 2+ edema Skin: warm, dry; incision clean/dry/intact w/ provena dressing in place  Recent Labs    11/20/22 1431 11/21/22 0501  HGB 9.6* 9.1*  HCT 30.3* 29.0*    Assessment/Plan: Summer Terrell is a 37 y.o. [id number] s/p rLTCS at [redacted]w[redacted]d for prior CS.  POD#1 - Doing welll; pain moderately well controlled. H/H appropriate  Routine postpartum care  OOB, ambulated  Lovenox for VTE prophylaxis Anemia: asymptomatic  Start po ferrous sulfate BID  Contraception: OCPs Feeding: formula  Dispo: Plan for discharge tomorrow .   LOS: 1 day   Derrel Nip, MD  OB Fellow  11/21/2022, 8:38 AM

## 2022-11-21 NOTE — Social Work (Signed)
CSW received consult for hx of Anxiety, Depression and Bipolar.  CSW met with MOB to offer support and complete assessment. CSW entered the room and observed MOB sitting on the bed and the infant lying comfortable on  the boppy, supervised by MOB. CSW introduced self, CSW role and reason for visit, OB was agreeable to visit. CSW inquired about how MOB was feeling MOB reported feeling sore due to the csection.  CSW inquired about MOB MH hx, MOB reported she was diagnosed over 10 years ago. MOB reported she was on different medications initially after her diagnosis and on Zoloft after her son was born in 2022. MOB reported a stable mood currently and throughout this pregnancy. MOB repore she takes hydroxyzine as needed. CSW inquired about therapy or other coping skills MOB reported note at this time. CSW assessed for safety, MOB denied any SI or HI. CSW provided education regarding the baby blues period vs. perinatal mood disorders, discussed treatment and gave resources for mental health follow up if concerns arise.  CSW recommends self-evaluation during the postpartum time period using the New Mom Checklist from Postpartum Progress and encouraged MOB to contact a medical professional if symptoms are noted at any time.  MOB identified her cousin and best friend as her supports.   CSW provided review of Sudden Infant Death Syndrome (SIDS) precautions.  MOB reported she has all necessary items for the infant including a  bassinet, crib and a car seat. CSW identifies no further need for intervention and no barriers to discharge at this time.  Denorris Reust, LCSWA Clinical Social Worker [phone] 

## 2022-11-22 ENCOUNTER — Other Ambulatory Visit (HOSPITAL_COMMUNITY): Payer: Self-pay

## 2022-11-22 MED ORDER — FUROSEMIDE 20 MG PO TABS
20.0000 mg | ORAL_TABLET | Freq: Every day | ORAL | 0 refills | Status: DC
  Filled 2022-11-22: qty 7, 7d supply, fill #0

## 2022-11-22 MED ORDER — OXYCODONE HCL 5 MG PO TABS
5.0000 mg | ORAL_TABLET | ORAL | 0 refills | Status: DC | PRN
  Filled 2022-11-22: qty 20, 4d supply, fill #0

## 2022-11-22 MED ORDER — ACETAMINOPHEN 500 MG PO TABS
1000.0000 mg | ORAL_TABLET | Freq: Four times a day (QID) | ORAL | 0 refills | Status: AC
  Filled 2022-11-22: qty 100, 13d supply, fill #0

## 2022-11-22 MED ORDER — NORETHINDRONE 0.35 MG PO TABS
1.0000 | ORAL_TABLET | Freq: Every day | ORAL | 11 refills | Status: DC
  Filled 2022-11-22: qty 28, 28d supply, fill #0

## 2022-11-22 MED ORDER — FUROSEMIDE 20 MG PO TABS
20.0000 mg | ORAL_TABLET | Freq: Every day | ORAL | 0 refills | Status: DC
  Filled 2022-11-22: qty 4, 4d supply, fill #0

## 2022-11-22 MED ORDER — AMLODIPINE BESYLATE 5 MG PO TABS
5.0000 mg | ORAL_TABLET | Freq: Every day | ORAL | 0 refills | Status: DC
  Filled 2022-11-22: qty 30, 30d supply, fill #0

## 2022-11-22 MED ORDER — IBUPROFEN 600 MG PO TABS
600.0000 mg | ORAL_TABLET | Freq: Four times a day (QID) | ORAL | 0 refills | Status: DC
  Filled 2022-11-22: qty 30, 8d supply, fill #0

## 2022-11-23 ENCOUNTER — Encounter: Payer: Self-pay | Admitting: Obstetrics & Gynecology

## 2022-11-23 ENCOUNTER — Encounter (HOSPITAL_COMMUNITY): Payer: Self-pay | Admitting: Obstetrics & Gynecology

## 2022-11-23 ENCOUNTER — Inpatient Hospital Stay (HOSPITAL_COMMUNITY)
Admission: AD | Admit: 2022-11-23 | Discharge: 2022-11-24 | Disposition: A | Discharge status: Home / Self Care | Payer: Medicaid Other | Attending: Obstetrics & Gynecology | Admitting: Obstetrics & Gynecology

## 2022-11-23 DIAGNOSIS — F419 Anxiety disorder, unspecified: Secondary | ICD-10-CM | POA: Insufficient documentation

## 2022-11-23 DIAGNOSIS — O165 Unspecified maternal hypertension, complicating the puerperium: Secondary | ICD-10-CM | POA: Diagnosis present

## 2022-11-23 DIAGNOSIS — Z79899 Other long term (current) drug therapy: Secondary | ICD-10-CM | POA: Diagnosis not present

## 2022-11-23 DIAGNOSIS — M545 Low back pain, unspecified: Secondary | ICD-10-CM

## 2022-11-23 DIAGNOSIS — O99893 Other specified diseases and conditions complicating puerperium: Secondary | ICD-10-CM | POA: Insufficient documentation

## 2022-11-23 DIAGNOSIS — Z789 Other specified health status: Secondary | ICD-10-CM

## 2022-11-23 DIAGNOSIS — M549 Dorsalgia, unspecified: Secondary | ICD-10-CM | POA: Diagnosis not present

## 2022-11-23 DIAGNOSIS — O900 Disruption of cesarean delivery wound: Secondary | ICD-10-CM | POA: Insufficient documentation

## 2022-11-23 DIAGNOSIS — O99345 Other mental disorders complicating the puerperium: Secondary | ICD-10-CM | POA: Insufficient documentation

## 2022-11-23 DIAGNOSIS — Z8249 Family history of ischemic heart disease and other diseases of the circulatory system: Secondary | ICD-10-CM | POA: Insufficient documentation

## 2022-11-23 LAB — CBC
HCT: 31.5 % — ABNORMAL LOW (ref 36.0–46.0)
Hemoglobin: 9.9 g/dL — ABNORMAL LOW (ref 12.0–15.0)
MCH: 24.6 pg — ABNORMAL LOW (ref 26.0–34.0)
MCHC: 31.4 g/dL (ref 30.0–36.0)
MCV: 78.4 fL — ABNORMAL LOW (ref 80.0–100.0)
Platelets: 322 10*3/uL (ref 150–400)
RBC: 4.02 MIL/uL (ref 3.87–5.11)
RDW: 16.4 % — ABNORMAL HIGH (ref 11.5–15.5)
WBC: 9.1 10*3/uL (ref 4.0–10.5)
nRBC: 0 % (ref 0.0–0.2)

## 2022-11-23 LAB — COMPREHENSIVE METABOLIC PANEL
ALT: 27 U/L (ref 0–44)
AST: 31 U/L (ref 15–41)
Albumin: 2.6 g/dL — ABNORMAL LOW (ref 3.5–5.0)
Alkaline Phosphatase: 136 U/L — ABNORMAL HIGH (ref 38–126)
Anion gap: 14 (ref 5–15)
BUN: 10 mg/dL (ref 6–20)
CO2: 24 mmol/L (ref 22–32)
Calcium: 9.1 mg/dL (ref 8.9–10.3)
Chloride: 100 mmol/L (ref 98–111)
Creatinine, Ser: 0.65 mg/dL (ref 0.44–1.00)
GFR, Estimated: >60 mL/min (ref >60)
Glucose, Bld: 131 mg/dL — ABNORMAL HIGH (ref 70–99)
Potassium: 3.7 mmol/L (ref 3.5–5.1)
Sodium: 138 mmol/L (ref 135–145)
Total Bilirubin: 0.4 mg/dL (ref 0.3–1.2)
Total Protein: 6.3 g/dL — ABNORMAL LOW (ref 6.5–8.1)

## 2022-11-23 MED ORDER — ACETAMINOPHEN-CAFFEINE 500-65 MG PO TABS
2.0000 | ORAL_TABLET | Freq: Once | ORAL | Status: AC
  Administered 2022-11-23: 2 via ORAL
  Filled 2022-11-23: qty 2

## 2022-11-23 NOTE — MAU Note (Addendum)
.  Summer Terrell is a 37 y.o. at Unknown here in MAU reporting HTN tonight and wanting her incision checked. Pt had a repeat c/s on Monday 5/27. Took her b/p tonight and all readings high. 170/101, 159/96, 161/96. States went home yesterday and was started on Amlodipine 5mg  yesterday for HTN. Tonight her face is flushing. The wound vac has been beeping since she left yesterday and pt states her tegaderm is coming loose over incision. Pt also reports having a headache today  Onset of complaint: today Pain score: 4 Vitals:   11/23/22 2208  Pulse: (!) 108  Resp: 18  Temp: 97.7 F (36.5 C)  SpO2: 98%     FHT:n/a Lab orders placed from triage:  none
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
## 2022-11-24 ENCOUNTER — Encounter: Payer: Medicaid Other | Admitting: Obstetrics & Gynecology

## 2022-11-24 ENCOUNTER — Inpatient Hospital Stay (EMERGENCY_DEPARTMENT_HOSPITAL)
Admission: AD | Admit: 2022-11-24 | Discharge: 2022-11-24 | Disposition: A | Discharge status: Home / Self Care | Payer: Medicaid Other | Source: Home / Self Care | Attending: Family Medicine | Admitting: Family Medicine

## 2022-11-24 DIAGNOSIS — M545 Low back pain, unspecified: Secondary | ICD-10-CM | POA: Diagnosis not present

## 2022-11-24 DIAGNOSIS — Z789 Other specified health status: Secondary | ICD-10-CM | POA: Diagnosis not present

## 2022-11-24 DIAGNOSIS — Z98891 History of uterine scar from previous surgery: Secondary | ICD-10-CM

## 2022-11-24 DIAGNOSIS — Z79899 Other long term (current) drug therapy: Secondary | ICD-10-CM | POA: Insufficient documentation

## 2022-11-24 DIAGNOSIS — T8189XA Other complications of procedures, not elsewhere classified, initial encounter: Secondary | ICD-10-CM | POA: Insufficient documentation

## 2022-11-24 MED ORDER — SILVER SULFADIAZINE 1 % EX CREA: 1.0000 | TOPICAL_CREAM | Freq: Every day | CUTANEOUS | 0 refills | Status: AC

## 2022-11-24 MED ORDER — CYCLOBENZAPRINE HCL 10 MG PO TABS: 10.0000 mg | ORAL_TABLET | Freq: Three times a day (TID) | ORAL | 0 refills | Status: DC | PRN

## 2022-11-24 MED ORDER — CYCLOBENZAPRINE HCL 5 MG PO TABS
10.0000 mg | ORAL_TABLET | Freq: Once | ORAL | Status: AC
  Administered 2022-11-24: 10 mg via ORAL
  Filled 2022-11-24: qty 2
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
## 2022-11-24 NOTE — MAU Note (Signed)
Pt says she del by C/S on Monday 5-27 Was here yesterday check and wound check - removed wound vac Says - Has drainage from wound -  Incision intact- no visual drainage

## 2022-11-27 ENCOUNTER — Encounter: Payer: Medicaid Other | Admitting: Obstetrics & Gynecology

## 2022-11-27 ENCOUNTER — Other Ambulatory Visit: Payer: Medicaid Other

## 2022-11-28 ENCOUNTER — Encounter: Payer: Self-pay | Admitting: Obstetrics and Gynecology

## 2022-11-28 ENCOUNTER — Ambulatory Visit (INDEPENDENT_AMBULATORY_CARE_PROVIDER_SITE_OTHER): Payer: Medicaid Other | Admitting: Obstetrics and Gynecology

## 2022-11-28 VITALS — BP 134/94 | HR 97 | Ht 68.0 in | Wt 327.8 lb

## 2022-11-28 DIAGNOSIS — Z8759 Personal history of other complications of pregnancy, childbirth and the puerperium: Secondary | ICD-10-CM

## 2022-11-28 DIAGNOSIS — O165 Unspecified maternal hypertension, complicating the puerperium: Secondary | ICD-10-CM

## 2022-11-28 DIAGNOSIS — Z98891 History of uterine scar from previous surgery: Secondary | ICD-10-CM

## 2022-11-28 MED ORDER — FUROSEMIDE 20 MG PO TABS: 20.0000 mg | ORAL_TABLET | Freq: Every day | ORAL | 0 refills | Status: DC

## 2022-11-28 MED ORDER — AMLODIPINE BESYLATE 10 MG PO TABS
10.0000 mg | ORAL_TABLET | Freq: Every day | ORAL | 1 refills | Status: DC
Start: 2022-11-28 — End: 2023-01-04

## 2022-11-28 NOTE — Progress Notes (Signed)
GYN VISIT Patient name: Summer Terrell MRN [id number]  Date of birth: [birth year] Chief Complaint:   Wound Check  History of Present Illness:   Summer Terrell is a 37 y.o. [phone]  female being seen today for incision check.    Patient's last menstrual period was 03/06/2022 (approximate). The current method of family planning is oral progesterone-only contraceptive.  Last pap 08/02/20. Results were: NILM     05/29/2022    9:48 AM 02/04/2021    8:53 AM 11/04/2020   10:21 AM 09/24/2020   12:17 PM  Depression screen PHQ 2/9  Decreased Interest 0 0 0 1  Down, Depressed, Hopeless 0 1 1 1   PHQ - 2 Score 0 1 1 2   Altered sleeping 0 1 0 1  Tired, decreased energy 2 1 2 2   Change in appetite 0 0 0 1  Feeling bad or failure about yourself  0 0 0 0  Trouble concentrating 0 0 0 1  Moving slowly or fidgety/restless 0 0 0 0  Suicidal thoughts 0 0 0 0  PHQ-9 Score 2 3 3 7         05/29/2022    9:48 AM 02/04/2021    8:53 AM 11/04/2020   10:21 AM 09/24/2020   12:18 PM  GAD 7 : Generalized Anxiety Score  Nervous, Anxious, on Edge 2 1 1 1   Control/stop worrying 1 1 1 1   Worry too much - different things 1 1 1 1   Trouble relaxing 1 1 1 1   Restless 0 0 0 0  Easily annoyed or irritable 1 1 1 1   Afraid - awful might happen 0 0 0 0  Total GAD 7 Score 6 5 5 5      Review of Systems:   Pertinent items are noted in HPI Denies fever/chills, dizziness, headaches, visual disturbances, fatigue, shortness of breath, chest pain, abdominal pain, vomiting, abnormal vaginal discharge/itching/odor/irritation, problems with periods, bowel movements, urination, or intercourse unless otherwise stated above.  Pertinent History Reviewed:  Reviewed past medical,surgical, social, obstetrical and family history.  Reviewed problem list, medications and allergies. Physical Assessment:   Vitals:   11/28/22 1114 11/28/22 1127  BP: (!) 135/90 (!) 134/94  Pulse: 91 97  Weight: (!) 327 lb 12.8 oz (148.7 kg)    Height: 5\' 8"  (1.727 m)   Body mass index is 49.84 kg/m.       Physical Examination:   General appearance: alert, well appearing, and in no distress  Mental status: alert, oriented to person, place, and time  Skin: warm & dry   Cardiovascular: normal heart rate noted  Respiratory: normal respiratory effort, no distress  Abdomen: soft, non-tender , incision well approximated, serosanguinous drainage   Pelvic: deferred  Extremities: mild edema    No results found for this or any previous visit (from the past 24 hour(s)).  Assessment & Plan:  1. S/P cesarean section Incision check healing well, well approximated edges Strict precautions when to follow up  Was prescribed POPs in hospital, has not started. Encouraged initiation before resume intercourse  2. History of pre-eclampsia 3. Postpartum hypertension Pre-e w/ SF this pregnancy. Taking Norvasc 5mg  once daily, elevated on repeat today. Will increase Norvasc to 10 mg once daily. BP check in one week    Meds:  Meds ordered this encounter  Medications   amLODipine (NORVASC) 10 MG tablet    Sig: Take 1 tablet (10 mg total) by mouth daily.    Dispense:  90 tablet  Refill:  1   furosemide (LASIX) 20 MG tablet    Sig: Take 1 tablet (20 mg total) by mouth daily.    Dispense:  5 tablet    Refill:  0    Return  7/1 for postpartum visit   Future Appointments  Date Time Provider Department Center  12/05/2022  2:50 PM [facility] NURSE [facility]  12/25/2022 11:30 AM Cheral Marker, CNM [facility]    Albertine Grates, FNP

## 2022-11-28 NOTE — Progress Notes (Deleted)
   GYN VISIT Patient name: Summer Terrell MRN [id number]  Date of birth: 1985/06/27 Chief Complaint:   Wound Check  History of Present Illness:   Summer Terrell is a 37 y.o. [phone]  female being seen today for incision check.    Patient's last menstrual period was 03/06/2022 (approximate). The current method of family planning is oral progesterone-only contraceptive.  Last pap 08/02/20. Results were: {Pap findings:25134}     05/29/2022    9:48 AM 02/04/2021    8:53 AM 11/04/2020   10:21 AM 09/24/2020   12:17 PM  Depression screen PHQ 2/9  Decreased Interest 0 0 0 1  Down, Depressed, Hopeless 0 1 1 1   PHQ - 2 Score 0 1 1 2   Altered sleeping 0 1 0 1  Tired, decreased energy 2 1 2 2   Change in appetite 0 0 0 1  Feeling bad or failure about yourself  0 0 0 0  Trouble concentrating 0 0 0 1  Moving slowly or fidgety/restless 0 0 0 0  Suicidal thoughts 0 0 0 0  PHQ-9 Score 2 3 3 7         05/29/2022    9:48 AM 02/04/2021    8:53 AM 11/04/2020   10:21 AM 09/24/2020   12:18 PM  GAD 7 : Generalized Anxiety Score  Nervous, Anxious, on Edge 2 1 1 1   Control/stop worrying 1 1 1 1   Worry too much - different things 1 1 1 1   Trouble relaxing 1 1 1 1   Restless 0 0 0 0  Easily annoyed or irritable 1 1 1 1   Afraid - awful might happen 0 0 0 0  Total GAD 7 Score 6 5 5 5      Review of Systems:   Pertinent items are noted in HPI Denies fever/chills, dizziness, headaches, visual disturbances, fatigue, shortness of breath, chest pain, abdominal pain, vomiting, abnormal vaginal discharge/itching/odor/irritation, problems with periods, bowel movements, urination, or intercourse unless otherwise stated above.  Pertinent History Reviewed:  Reviewed past medical,surgical, social, obstetrical and family history.  Reviewed problem list, medications and allergies. Physical Assessment:   Vitals:   11/28/22 1114  BP: (!) 135/90  Pulse: 91  Weight: (!) 327 lb 12.8 oz (148.7 kg)  Height: 5\' 8"   (1.727 m)  Body mass index is 49.84 kg/m.       Physical Examination:   General appearance: alert, well appearing, and in no distress  Mental status: alert, oriented to person, place, and time  Skin: warm & dry   Cardiovascular: normal heart rate noted  Respiratory: normal respiratory effort, no distress  Abdomen: soft, non-tender , incision**  Pelvic: {:[id number]}  Extremities: no edema    No results found for this or any previous visit (from the past 24 hour(s)).  Assessment & Plan:  1. S/P cesarean section Incision check   2. History of pre-eclampsia Pre-e w/ SF this pregnancy. Taking Norvasc 5mg  once daily, elevated on repeat today. Will increase Norvasc to 10 mg once daily. BP check in one week    Meds: No orders of the defined types were placed in this encounter.   Return  7/1 for postpartum visit   Future Appointments  Date Time Provider Department Center  12/25/2022 11:30 AM Cheral Marker, CNM [facility]     Albertine Grates CNM, [facility] 11/28/2022 11:25 AM

## 2022-11-30 ENCOUNTER — Other Ambulatory Visit: Payer: Medicaid Other

## 2022-12-05 ENCOUNTER — Ambulatory Visit (INDEPENDENT_AMBULATORY_CARE_PROVIDER_SITE_OTHER): Payer: Medicaid Other | Admitting: *Deleted

## 2022-12-05 VITALS — BP 125/88 | HR 96

## 2022-12-05 DIAGNOSIS — Z8759 Personal history of other complications of pregnancy, childbirth and the puerperium: Secondary | ICD-10-CM

## 2022-12-05 NOTE — Progress Notes (Signed)
   NURSE VISIT- BLOOD PRESSURE CHECK  SUBJECTIVE:  Summer Terrell is a 37 y.o. [phone] female here for BP check. She is postpartum, delivery date 11/20/22     HYPERTENSION ROS:  Postpartum:  Severe headaches that don't go away with tylenol/other medicines: No  Visual changes (seeing spots/double/blurred vision) No  Severe pain under right breast breast or in center of upper chest No  Severe nausea/vomiting No  Taking medicines as instructed yes   OBJECTIVE:  BP 125/88 (BP Location: Right Arm, Patient Position: Sitting, Cuff Size: Large)   Pulse 96   Breastfeeding No   Appearance alert, well appearing, and in no distress.  ASSESSMENT: Postpartum  blood pressure check  PLAN: Discussed with [name], CNM, [facility]   Recommendations: stop medicine 2 days before next visit   Follow-up: as scheduled   [name]  12/05/2022 3:32 PM

## 2022-12-12 ENCOUNTER — Encounter: Payer: Self-pay | Admitting: Women's Health

## 2022-12-15 ENCOUNTER — Encounter (HOSPITAL_COMMUNITY): Payer: Self-pay | Admitting: Family Medicine

## 2022-12-15 ENCOUNTER — Other Ambulatory Visit: Payer: Self-pay

## 2022-12-15 ENCOUNTER — Encounter: Payer: Self-pay | Admitting: Advanced Practice Midwife

## 2022-12-15 ENCOUNTER — Observation Stay (HOSPITAL_COMMUNITY)
Admission: AD | Admit: 2022-12-15 | Discharge: 2022-12-18 | Disposition: A | Discharge status: Home / Self Care | Payer: Medicaid Other | Attending: Family Medicine | Admitting: Family Medicine

## 2022-12-15 ENCOUNTER — Inpatient Hospital Stay (HOSPITAL_COMMUNITY): Payer: Medicaid Other

## 2022-12-15 DIAGNOSIS — T8149XA Infection following a procedure, other surgical site, initial encounter: Principal | ICD-10-CM | POA: Diagnosis present

## 2022-12-15 DIAGNOSIS — O8600 Infection of obstetric surgical wound, unspecified: Secondary | ICD-10-CM | POA: Diagnosis not present

## 2022-12-15 DIAGNOSIS — R188 Other ascites: Secondary | ICD-10-CM | POA: Diagnosis not present

## 2022-12-15 DIAGNOSIS — O8601 Infection of obstetric surgical wound, superficial incisional site: Principal | ICD-10-CM | POA: Diagnosis present

## 2022-12-15 DIAGNOSIS — Z87891 Personal history of nicotine dependence: Secondary | ICD-10-CM | POA: Diagnosis not present

## 2022-12-15 LAB — COMPREHENSIVE METABOLIC PANEL
ALT: 34 U/L (ref 0–44)
AST: 26 U/L (ref 15–41)
Albumin: 3.1 g/dL — ABNORMAL LOW (ref 3.5–5.0)
Alkaline Phosphatase: 91 U/L (ref 38–126)
Anion gap: 10 (ref 5–15)
BUN: 8 mg/dL (ref 6–20)
CO2: 26 mmol/L (ref 22–32)
Calcium: 8.5 mg/dL — ABNORMAL LOW (ref 8.9–10.3)
Chloride: 104 mmol/L (ref 98–111)
Creatinine, Ser: 0.8 mg/dL (ref 0.44–1.00)
GFR, Estimated: >60 mL/min (ref >60)
Glucose, Bld: 101 mg/dL — ABNORMAL HIGH (ref 70–99)
Potassium: 3.6 mmol/L (ref 3.5–5.1)
Sodium: 140 mmol/L (ref 135–145)
Total Bilirubin: 0.5 mg/dL (ref 0.3–1.2)
Total Protein: 6.1 g/dL — ABNORMAL LOW (ref 6.5–8.1)

## 2022-12-15 LAB — CBC
HCT: 34.1 % — ABNORMAL LOW (ref 36.0–46.0)
Hemoglobin: 10.7 g/dL — ABNORMAL LOW (ref 12.0–15.0)
MCH: 24.7 pg — ABNORMAL LOW (ref 26.0–34.0)
MCHC: 31.4 g/dL (ref 30.0–36.0)
MCV: 78.6 fL — ABNORMAL LOW (ref 80.0–100.0)
Platelets: 318 10*3/uL (ref 150–400)
RBC: 4.34 MIL/uL (ref 3.87–5.11)
RDW: 14.4 % (ref 11.5–15.5)
WBC: 10.7 10*3/uL — ABNORMAL HIGH (ref 4.0–10.5)
nRBC: 0 % (ref 0.0–0.2)

## 2022-12-15 LAB — TYPE AND SCREEN
ABO/RH(D): B POS
Antibody Screen: NEGATIVE

## 2022-12-15 MED ORDER — GUAIFENESIN 100 MG/5ML PO LIQD: 15.0000 mL | ORAL | Status: DC | PRN

## 2022-12-15 MED ORDER — IBUPROFEN 600 MG PO TABS
600.0000 mg | ORAL_TABLET | Freq: Four times a day (QID) | ORAL | Status: DC | PRN
  Administered 2022-12-16 – 2022-12-18 (×3): 600 mg via ORAL
  Filled 2022-12-15 (×4): qty 1

## 2022-12-15 MED ORDER — POLYETHYLENE GLYCOL 3350 17 G PO PACK: 17.0000 g | PACK | Freq: Every day | ORAL | Status: DC | PRN

## 2022-12-15 MED ORDER — AMLODIPINE BESYLATE 5 MG PO TABS
10.0000 mg | ORAL_TABLET | Freq: Every day | ORAL | Status: DC
  Administered 2022-12-17 – 2022-12-18 (×2): 10 mg via ORAL
  Filled 2022-12-15 (×2): qty 2

## 2022-12-15 MED ORDER — ENOXAPARIN SODIUM 80 MG/0.8ML IJ SOSY
70.0000 mg | PREFILLED_SYRINGE | INTRAMUSCULAR | Status: DC
  Administered 2022-12-15 – 2022-12-17 (×3): 70 mg via SUBCUTANEOUS
  Filled 2022-12-15 (×3): qty 0.8

## 2022-12-15 MED ORDER — MENTHOL 3 MG MT LOZG: 1.0000 | LOZENGE | OROMUCOSAL | Status: DC | PRN

## 2022-12-15 MED ORDER — ALUM & MAG HYDROXIDE-SIMETH 200-200-20 MG/5ML PO SUSP: 30.0000 mL | ORAL | Status: DC | PRN

## 2022-12-15 MED ORDER — SERTRALINE HCL 50 MG PO TABS
50.0000 mg | ORAL_TABLET | Freq: Every day | ORAL | Status: DC
  Administered 2022-12-16 – 2022-12-18 (×3): 50 mg via ORAL
  Filled 2022-12-15 (×3): qty 1

## 2022-12-15 MED ORDER — VANCOMYCIN HCL 1500 MG/300ML IV SOLN
1500.0000 mg | Freq: Three times a day (TID) | INTRAVENOUS | Status: DC
  Administered 2022-12-15 – 2022-12-18 (×8): 1500 mg via INTRAVENOUS
  Filled 2022-12-15 (×11): qty 300

## 2022-12-15 MED ORDER — PIPERACILLIN-TAZOBACTAM 3.375 G IVPB
3.3750 g | Freq: Three times a day (TID) | INTRAVENOUS | Status: DC
  Administered 2022-12-15 – 2022-12-18 (×9): 3.375 g via INTRAVENOUS
  Filled 2022-12-15 (×9): qty 50

## 2022-12-15 MED ORDER — LACTATED RINGERS IV SOLN: INTRAVENOUS | Status: DC

## 2022-12-15 MED ORDER — OXYCODONE-ACETAMINOPHEN 5-325 MG PO TABS
1.0000 | ORAL_TABLET | ORAL | Status: DC | PRN
  Administered 2022-12-15 – 2022-12-17 (×7): 1 via ORAL
  Filled 2022-12-15: qty 2
  Filled 2022-12-15 (×6): qty 1

## 2022-12-15 MED ORDER — HYDROXYZINE HCL 50 MG PO TABS
25.0000 mg | ORAL_TABLET | Freq: Three times a day (TID) | ORAL | Status: DC | PRN
  Administered 2022-12-15 – 2022-12-17 (×3): 25 mg via ORAL
  Filled 2022-12-15 (×3): qty 1

## 2022-12-15 NOTE — MAU Note (Signed)
Patient arrived back from CT. This RN called [employer] to request they bring the brother back with the child to confirm that he was who he said he was. Security stated the patient had already been wheeled to where the patient's child and brother were sitting and they confirmed that it was the brother. Security reports the patient's brother and her child walked out.  RN confirmed with patient that she verified it was her brother prior to her daughter leaving.
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
## 2022-12-15 NOTE — Progress Notes (Signed)
Pharmacy Antibiotic Note  Summer Terrell is a 37 y.o. female admitted on 12/15/2022 with wound infection s/p LTCS on 11/20/22.  Pharmacy has been consulted for Vancomycin and Zosyn dosing for wound infection (abscess vs seroma vs postop hematoma.   Plan: Vancomycin 1500mg  IV q8h based on AUC dosing. Zosyn 3.375g IV q8h with 4 hour infusion Will continue to watch renal function closely and determine timing of Vanc trough to target 15-20.   Calculated AUC= 495.2 Calculated Css max= 28.5 Calculated Css min= 16.3  Height: 5\' 8"  (172.7 cm) Weight: (!) 147.1 kg (324 lb 3.2 oz) IBW/kg (Calculated) : 63.9  Temp (24hrs), Avg:98.1 F (36.7 C), Min:98.1 F (36.7 C), Max:98.1 F (36.7 C)  Recent Labs  Lab 12/15/22 1653  WBC 10.7*  CREATININE 0.80    Estimated Creatinine Clearance: 149.2 mL/min (by C-G formula based on SCr of 0.8 mg/dL).    Allergies  Allergen Reactions   Rocephin [Ceftriaxone Sodium In Dextrose] Anaphylaxis, Hives and Shortness Of Breath    Antimicrobials this admission: Vancomycin 6/21 >> Zosyn 6/21 >>   Thank you for allowing pharmacy to be a part of this patient's care.  Claybon Jabs 12/15/2022 7:46 PM

## 2022-12-15 NOTE — MAU Note (Signed)
.  Summer Terrell is a 37 y.o. at Unknown here in MAU reporting: Desires an incision check. She reports her incision has been leaking since the wound vac was removed and reports. She reports there is an odor around her incision and reports her incision is red where the drainage is coming from. She reports the fluid was initially pinkish but is now almost yellow. She reports yesterday she applied an antiseptic to her wound yesterday.   Patient evaluated on 5/30 and 5/31 in MAU for drainage from her incision.  Pain score: 5/10 incision  Lab orders placed from triage:  none

## 2022-12-16 ENCOUNTER — Inpatient Hospital Stay (HOSPITAL_COMMUNITY): Payer: Medicaid Other

## 2022-12-16 DIAGNOSIS — T8149XA Infection following a procedure, other surgical site, initial encounter: Secondary | ICD-10-CM

## 2022-12-16 LAB — CBC WITH DIFFERENTIAL/PLATELET
Abs Immature Granulocytes: 0.04 10*3/uL (ref 0.00–0.07)
Basophils Absolute: 0 10*3/uL (ref 0.0–0.1)
Basophils Relative: 0 %
Eosinophils Absolute: 0.9 10*3/uL — ABNORMAL HIGH (ref 0.0–0.5)
Eosinophils Relative: 10 %
HCT: 35.7 % — ABNORMAL LOW (ref 36.0–46.0)
Hemoglobin: 11.2 g/dL — ABNORMAL LOW (ref 12.0–15.0)
Immature Granulocytes: 0 %
Lymphocytes Relative: 24 %
Lymphs Abs: 2.3 10*3/uL (ref 0.7–4.0)
MCH: 25.2 pg — ABNORMAL LOW (ref 26.0–34.0)
MCHC: 31.4 g/dL (ref 30.0–36.0)
MCV: 80.4 fL (ref 80.0–100.0)
Monocytes Absolute: 0.4 10*3/uL (ref 0.1–1.0)
Monocytes Relative: 4 %
Neutro Abs: 5.7 10*3/uL (ref 1.7–7.7)
Neutrophils Relative %: 62 %
Platelets: 339 10*3/uL (ref 150–400)
RBC: 4.44 MIL/uL (ref 3.87–5.11)
RDW: 14.5 % (ref 11.5–15.5)
WBC: 9.4 10*3/uL (ref 4.0–10.5)
nRBC: 0 % (ref 0.0–0.2)

## 2022-12-16 LAB — PROTIME-INR
INR: 1 (ref 0.8–1.2)
Prothrombin Time: 13.6 seconds (ref 11.4–15.2)

## 2022-12-16 MED ORDER — LIDOCAINE HCL (PF) 1 % IJ SOLN
5.0000 mL | Freq: Once | INTRAMUSCULAR | Status: AC
  Administered 2022-12-16: 5 mL via INTRADERMAL

## 2022-12-16 NOTE — Progress Notes (Signed)
2x2 dressing applied with paper tape

## 2022-12-16 NOTE — Progress Notes (Signed)
Patient ID: Summer Terrell, female   DOB: Mar 09, 1986, 37 y.o.   MRN: [id number]   Assessment/Plan: Principal Problem:   Wound infection following procedure  For IR drainage today On Zosyn and Vancomycin  Subjective: Interval History: has not had any significant improvement. Is NPO for procedure.  Objective: Vital signs in last 24 hours: Temp:  [97.6 F (36.4 C)-98.1 F (36.7 C)] 97.7 F (36.5 C) (06/22 0821) Pulse Rate:  [73-87] 80 (06/22 0821) Resp:  [18-20] 18 (06/22 0821) BP: (106-122)/(52-70) 106/52 (06/22 0821) SpO2:  [96 %-98 %] 98 % (06/22 0821) Weight:  [147.1 kg-147.9 kg] 147.9 kg (06/22 0523)  Intake/Output from previous day: No intake/output data recorded. Intake/Output this shift: No intake/output data recorded.  General appearance: alert, cooperative, and appears stated age Head: Normocephalic, without obvious abnormality, atraumatic Abdomen: soft, minimal erythema at wound, some drainage, firm tender area on abdomen toward right side. Extremities: extremities normal, atraumatic, no cyanosis or edema  Results for orders placed or performed during the hospital encounter of 12/15/22 (from the past 24 hour(s))  CBC     Status: Abnormal   Collection Time: 12/15/22  4:53 PM  Result Value Ref Range   WBC 10.7 (H) 4.0 - 10.5 K/uL   RBC 4.34 3.87 - 5.11 MIL/uL   Hemoglobin 10.7 (L) 12.0 - 15.0 g/dL   HCT 40.9 (L) 81.1 - 91.4 %   MCV 78.6 (L) 80.0 - 100.0 fL   MCH 24.7 (L) 26.0 - 34.0 pg   MCHC 31.4 30.0 - 36.0 g/dL   RDW 78.2 95.6 - 21.3 %   Platelets 318 150 - 400 K/uL   nRBC 0.0 0.0 - 0.2 %  Comprehensive metabolic panel     Status: Abnormal   Collection Time: 12/15/22  4:53 PM  Result Value Ref Range   Sodium 140 135 - 145 mmol/L   Potassium 3.6 3.5 - 5.1 mmol/L   Chloride 104 98 - 111 mmol/L   CO2 26 22 - 32 mmol/L   Glucose, Bld 101 (H) 70 - 99 mg/dL   BUN 8 6 - 20 mg/dL   Creatinine, Ser 0.86 0.44 - 1.00 mg/dL   Calcium 8.5 (L) 8.9 - 10.3 mg/dL    Total Protein 6.1 (L) 6.5 - 8.1 g/dL   Albumin 3.1 (L) 3.5 - 5.0 g/dL   AST 26 15 - 41 U/L   ALT 34 0 - 44 U/L   Alkaline Phosphatase 91 38 - 126 U/L   Total Bilirubin 0.5 0.3 - 1.2 mg/dL   GFR, Estimated >57 >84 mL/min   Anion gap 10 5 - 15  Type and screen [facility]     Status: None   Collection Time: 12/15/22  8:41 PM  Result Value Ref Range   ABO/RH(D) B POS    Antibody Screen NEG    Sample Expiration      12/18/2022,2359 Performed at [facility], [address], [place], [place] [address]   Protime-INR     Status: None   Collection Time: 12/16/22  7:31 AM  Result Value Ref Range   Prothrombin Time 13.6 11.4 - 15.2 seconds   INR 1.0 0.8 - 1.2    Studies/Results: CT PELVIS WO CONTRAST  Result Date: 12/15/2022 CLINICAL DATA:  Recent cesarean section, possible surgical site infection EXAM: CT PELVIS WITHOUT CONTRAST TECHNIQUE: Multidetector CT imaging of the pelvis was performed following the standard protocol without intravenous contrast. RADIATION DOSE REDUCTION: This exam was performed according to  the departmental dose-optimization program which includes automated exposure control, adjustment of the mA and/or kV according to patient size and/or use of iterative reconstruction technique. COMPARISON:  None Available. FINDINGS: Urinary Tract:  Distal ureters and bladder are unremarkable. Bowel: No bowel obstruction or ileus. No bowel wall thickening or inflammatory change. Vascular/Lymphatic: No significant vascular findings on this unenhanced exam. No pathologic adenopathy. Reproductive: Enlarged uterus consistent with recent post gravid state. No gross uterine abnormality seen on this unenhanced exam. No adnexal masses. Other: Multilocular fluid collection within the lower anterior abdominal wall, measuring up to 15.5 cm in craniocaudal length and 4.6 x 11.0 cm in transverse dimension. This is located just to the right of midline. Findings could reflect  postoperative seroma, hematoma, or developing abscess. Evaluation is limited without intravenous contrast. Subcutaneous fat stranding throughout the lower anterior abdominal wall may be related to recent cesarean section or cellulitis. No free intraperitoneal fluid or free gas. No abdominal wall hernia. Musculoskeletal: No acute or destructive bony abnormalities. Reconstructed images demonstrate no additional findings. IMPRESSION: 1. Large multilocular fluid collection within the lower anterior abdominal wall along the site of prior cesarean section, which could reflect abscess, seroma, or postoperative hematoma. Sterility of fluid collection cannot be assessed by CT. 2. Diffuse subcutaneous fat stranding throughout the lower anterior abdominal wall, which may reflect postsurgical changes versus cellulitis. Electronically Signed   By: Sharlet Salina M.D.   On: 12/15/2022 18:50    Scheduled Meds:  amLODipine  10 mg Oral Daily   enoxaparin (LOVENOX) injection  70 mg Subcutaneous Q24H   sertraline  50 mg Oral Daily   Continuous Infusions:  lactated ringers 125 mL/hr at 12/15/22 2044   piperacillin-tazobactam (ZOSYN)  IV 3.375 g (12/16/22 0415)   vancomycin 1,500 mg (12/16/22 0417)   PRN Meds:alum & mag hydroxide-simeth, guaiFENesin, hydrOXYzine, ibuprofen, menthol-cetylpyridinium, oxyCODONE-acetaminophen, polyethylene glycol    LOS: 1 day   Reva Bores, MD 12/16/2022 8:50 AM

## 2022-12-16 NOTE — Progress Notes (Signed)
C-Section incision has a red appearance on the right side with no current drainage. Abdomen directly above the incision on the right palpates firm with patient complaints of tender to touch. Patient states 5/10 but requests no medication at this time.

## 2022-12-16 NOTE — Procedures (Signed)
Interventional Radiology Procedure Note  Procedure: Placement of a 71F drain into lower abdominal wall fluid collection.  150 mL peach colored fluid aspirated.   Complications: None  Estimated Blood Loss: None  Recommendations: - Drain to JP - Cultures sent   Signed,  Sterling Big, MD
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
## 2022-12-17 LAB — CREATININE, SERUM
Creatinine, Ser: 0.86 mg/dL (ref 0.44–1.00)
GFR, Estimated: >60 mL/min (ref >60)

## 2022-12-17 LAB — VANCOMYCIN, TROUGH: Vancomycin Tr: 16 ug/mL (ref 15–20)

## 2022-12-17 MED ORDER — ONDANSETRON 4 MG PO TBDP
4.0000 mg | ORAL_TABLET | Freq: Four times a day (QID) | ORAL | Status: DC | PRN
  Administered 2022-12-17: 4 mg via ORAL
  Filled 2022-12-17: qty 1

## 2022-12-17 NOTE — Progress Notes (Signed)
JP bulb changed due to clog.

## 2022-12-17 NOTE — Progress Notes (Signed)
    [facility] OB[facility]N Attending Note  Subjective:  Pt resting comfortably in bed.  Notes mild abdominal discomfort- rates her pain 3/10    Objective:  Blood pressure (!) 146/77, pulse 80, temperature 98.2 F (36.8 C), temperature source Oral, resp. rate 18, height 5\' 8"  (1.727 m), weight (!) 147.9 kg, SpO2 98 %, not currently breastfeeding.  Gen: NAD HENT: Normocephalic, atraumatic Lungs: Normal respiratory effort Heart: Regular rate noted Abdomen: obese, soft and appropriately tender Incision: well healed, IR drain in place LLQ with serosanguinous fluid Ext: 1+ edema, no calf tenderness bilaterally  Assessment & Plan:  37 y.o. [id number] admitted for postop surgical abscess -on IV Vanc/Zosyn -cultures pending -drain in place -clinically noting improvement  DISPO: Plan for inpatient management until cultures return to better tailor antibiotic treatment.    Myna Hidalgo, DO Attending Obstetrician & Gynecologist, [facility] for [employer], [facility]

## 2022-12-17 NOTE — Plan of Care (Signed)
  Problem: Education: Goal: Knowledge of the prescribed therapeutic regimen will improve Outcome: Progressing Goal: Understanding of sexual limitations or changes related to disease process or condition will improve Outcome: Progressing Goal: Individualized Educational Video(s) Outcome: Progressing   Problem: Self-Concept: Goal: Communication of feelings regarding changes in body function or appearance will improve Outcome: Progressing   Problem: Skin Integrity: Goal: Demonstration of wound healing without infection will improve Outcome: Progressing   Problem: Education: Goal: Knowledge of condition will improve Outcome: Progressing Goal: Individualized Educational Video(s) Outcome: Progressing Goal: Individualized Newborn Educational Video(s) Outcome: Progressing   Problem: Activity: Goal: Will verbalize the importance of balancing activity with adequate rest periods Outcome: Progressing Goal: Ability to tolerate increased activity will improve Outcome: Progressing   Problem: Coping: Goal: Ability to identify and utilize available resources and services will improve Outcome: Progressing   Problem: Life Cycle: Goal: Chance of risk for complications during the postpartum period will decrease Outcome: Progressing   Problem: Role Relationship: Goal: Ability to demonstrate positive interaction with newborn will improve Outcome: Progressing   Problem: Skin Integrity: Goal: Demonstration of wound healing without infection will improve Outcome: Progressing   Problem: Education: Goal: Knowledge of General Education information will improve Description: Including pain rating scale, medication(s)/side effects and non-pharmacologic comfort measures Outcome: Progressing   Problem: Health Behavior/Discharge Planning: Goal: Ability to manage health-related needs will improve Outcome: Progressing   Problem: Clinical Measurements: Goal: Ability to maintain clinical  measurements within normal limits will improve Outcome: Progressing Goal: Will remain free from infection Outcome: Progressing Goal: Diagnostic test results will improve Outcome: Progressing Goal: Respiratory complications will improve Outcome: Progressing Goal: Cardiovascular complication will be avoided Outcome: Progressing   Problem: Activity: Goal: Risk for activity intolerance will decrease Outcome: Progressing   Problem: Nutrition: Goal: Adequate nutrition will be maintained Outcome: Progressing   Problem: Coping: Goal: Level of anxiety will decrease Outcome: Progressing   Problem: Elimination: Goal: Will not experience complications related to bowel motility Outcome: Progressing Goal: Will not experience complications related to urinary retention Outcome: Progressing   Problem: Pain Managment: Goal: General experience of comfort will improve Outcome: Progressing   Problem: Safety: Goal: Ability to remain free from injury will improve Outcome: Progressing   Problem: Skin Integrity: Goal: Risk for impaired skin integrity will decrease Outcome: Progressing

## 2022-12-17 NOTE — Progress Notes (Signed)
Pharmacy Antibiotic Note  Summer Terrell is a 37 y.o. female admitted on 12/15/2022 with  wound infection s/p Csection .  Pharmacy has been consulted for Vancomycin and Zosyn dosing.  Plan: Continue Vancomycin 1500 mg IV  Q8h  Continue Zosyn 3.375 gm IV Q8h  Vancomycin trough of 16 is within desired goal of 15-20.  Height: 5\' 8"  (172.7 cm) Weight: (!) 147.9 kg (326 lb) IBW/kg (Calculated) : 63.9 kg  Temp (24hrs), Avg:98.1 F (36.7 C), Min:97.9 F (36.6 C), Max:98.2 F (36.8 C)  Recent Labs  Lab 12/15/22 1653 12/16/22 2117 12/17/22 1234  WBC 10.7* 9.4  --   CREATININE 0.80  --  0.86  VANCOTROUGH  --   --  16    Estimated Creatinine Clearance: 139.2 mL/min (by C-G formula based on SCr of 0.86 mg/dL).    Allergies  Allergen Reactions   Rocephin [Ceftriaxone Sodium In Dextrose] Anaphylaxis, Hives and Shortness Of Breath    Antimicrobials this admission: Zosyn 6/21 >>  Vancomycin  6/21 >>   Microbiology results: 6/22 Wound Cx: NGTD  Thank you for allowing pharmacy to be a part of this patient's care.  Natasha Bence 12/17/2022 1:33 PM

## 2022-12-18 MED ORDER — SULFAMETHOXAZOLE-TRIMETHOPRIM 800-160 MG PO TABS: 1.0000 | ORAL_TABLET | Freq: Two times a day (BID) | ORAL | 1 refills | Status: DC

## 2022-12-18 MED ORDER — OXYCODONE-ACETAMINOPHEN 5-325 MG PO TABS: 1.0000 | ORAL_TABLET | Freq: Four times a day (QID) | ORAL | 0 refills | Status: AC | PRN

## 2022-12-18 NOTE — Progress Notes (Signed)
Pt. discharge papers given. Pt. educated on plan of care, how to flush JP drain and change dressing. Pt. verbalized understanding.

## 2022-12-18 NOTE — Progress Notes (Addendum)
Referring Physician(s): Dr Katharine Look  Supervising Physician: Gilmer Mor  Patient Status:  Summer Terrell - In-pt  Chief Complaint:  C section 11/20/22 Post surgical infection Fluid collection noted on CT  Subjective:   Interventional Radiology Procedure Note 6/22   Procedure: Placement of a 68F drain into lower abdominal wall fluid collection.  150 mL peach colored fluid aspirated.  Complications: None   Doing well with drain Wants to go home Draining well; over 100 cc daily  Allergies: Rocephin [ceftriaxone sodium in dextrose]  Medications: Prior to Admission medications   Medication Sig Start Date End Date Taking? Authorizing Provider  acetaminophen (TYLENOL) 500 MG tablet Take 2 tablets (1,000 mg total) by mouth every 6 (six) hours. 11/22/22  Yes Cresenzo, Cyndi Lennert, MD  amLODipine (NORVASC) 10 MG tablet Take 1 tablet (10 mg total) by mouth daily. 11/28/22  Yes Sue Lush, FNP  furosemide (LASIX) 20 MG tablet Take 1 tablet (20 mg total) by mouth daily. 11/28/22  Yes Sue Lush, FNP  hydrOXYzine (ATARAX) 25 MG tablet Take 1 tablet (25 mg total) by mouth 3 (three) times daily as needed for anxiety. 10/26/22  Yes Myna Hidalgo, DO  ibuprofen (ADVIL) 600 MG tablet Take 1 tablet (600 mg total) by mouth every 6 (six) hours. 11/22/22  Yes Celedonio Savage, MD  Accu-Chek Softclix Lancets lancets Use as instructed to check blood sugar 4 times daily Patient not taking: Reported on 11/28/2022 09/21/22   Myna Hidalgo, DO  cyclobenzaprine (FLEXERIL) 10 MG tablet Take 1 tablet (10 mg total) by mouth every 8 (eight) hours as needed for muscle spasms. Patient not taking: Reported on 11/28/2022 11/24/22   Aviva Signs, CNM  ferrous sulfate 325 (65 FE) MG tablet Take 1 tablet (325 mg total) by mouth every other day. Patient not taking: Reported on 11/28/2022 05/30/22   Cheral Marker, CNM  glucose blood (ACCU-CHEK GUIDE) test strip Use as instructed to check blood sugar 4 times  daily Patient not taking: Reported on 11/28/2022 09/21/22   Myna Hidalgo, DO  norethindrone (ORTHO MICRONOR) 0.35 MG tablet Take 1 tablet (0.35 mg total) by mouth daily. Patient not taking: Reported on 11/28/2022 11/22/22   Celedonio Savage, MD  oxyCODONE (OXY IR/ROXICODONE) 5 MG immediate release tablet Take 1 tablet (5 mg total) by mouth every 4 (four) hours as needed for moderate pain. Patient not taking: Reported on 12/05/2022 11/22/22   Celedonio Savage, MD  Prenatal Multivit-Min-Fe-FA (PRENATAL/IRON PO) Take 1 tablet by mouth daily. Patient not taking: Reported on 11/28/2022    [provider]  sertraline (ZOLOFT) 50 MG tablet Take 1/2 tablet x 14 days then increase to 1 tablet daily 09/20/22   Myna Hidalgo, DO  silver sulfADIAZINE (SILVADENE) 1 % cream Apply 1 Application topically daily. 11/24/22   [name], CNM     Vital Signs: BP (!) 121/42 (BP Location: Right Arm)   Pulse 68   Temp 97.9 F (36.6 C) (Oral)   Resp 18   Ht 5\' 8"  (1.727 m)   Wt (!) 330 lb 3.2 oz (149.8 kg)   SpO2 98%   Breastfeeding No   BMI 50.21 kg/m   Physical Exam Vitals reviewed.  Skin:    General: Skin is warm.     Comments: Site is clean and dry NT no bleeding OP serous color Asp/flushes easily  Neurological:     Mental Status: She is alert.     Imaging: [place] IMAGE GUIDED DRAINAGE  BY PERCUTANEOUS CATHETER  Result Date: 12/16/2022 INDICATION: 37 year old female status post Caesarean section with fluid collection in the lower abdominal pannus. EXAM: [place] IMAGE GUIDED DRAINAGE BY PERCUTANEOUS CATHETE MEDICATIONS: None ANESTHESIA/SEDATION: None COMPLICATIONS: None immediate. PROCEDURE: Informed written consent was obtained from the patient after a thorough discussion of the procedural risks, benefits and alternatives. All questions were addressed. Maximal Sterile Barrier Technique was utilized including caps, mask, sterile gowns, sterile gloves, sterile drape, hand hygiene and skin  antiseptic. A timeout was performed prior to the initiation of the procedure. Ultrasound was used to interrogate the abdominal pannus. A complex fluid collection is visualized. A suitable skin entry site was selected and marked. Local anesthesia was attained by infiltration with 1% lidocaine. A small dermatotomy was made. Under real-time ultrasound guidance, a 12 French drainage catheter was advanced into the fluid collection using trocar technique. The catheter was advanced into the fluid collection and formed. Aspiration yields approximately 150 mL of serous yellow fluid. The drain was secured with 0 Prolene suture and connected to JP bulb suction. Aspirated fluid was sent for Gram stain and culture. IMPRESSION: Successful placement of a 12 French drainage catheter into the complex fluid collection within the abdominal pannus. Aspiration yields approximately 150 mL golden colored serous fluid. A sample was sent for Gram stain and culture. Electronically Signed   By: Malachy Moan M.D.   On: 12/16/2022 13:02   CT PELVIS WO CONTRAST  Result Date: 12/15/2022 CLINICAL DATA:  Recent cesarean section, possible surgical site infection EXAM: CT PELVIS WITHOUT CONTRAST TECHNIQUE: Multidetector CT imaging of the pelvis was performed following the standard protocol without intravenous contrast. RADIATION DOSE REDUCTION: This exam was performed according to the departmental dose-optimization program which includes automated exposure control, adjustment of the mA and/or kV according to patient size and/or use of iterative reconstruction technique. COMPARISON:  None Available. FINDINGS: Urinary Tract:  Distal ureters and bladder are unremarkable. Bowel: No bowel obstruction or ileus. No bowel wall thickening or inflammatory change. Vascular/Lymphatic: No significant vascular findings on this unenhanced exam. No pathologic adenopathy. Reproductive: Enlarged uterus consistent with recent post gravid state. No gross  uterine abnormality seen on this unenhanced exam. No adnexal masses. Other: Multilocular fluid collection within the lower anterior abdominal wall, measuring up to 15.5 cm in craniocaudal length and 4.6 x 11.0 cm in transverse dimension. This is located just to the right of midline. Findings could reflect postoperative seroma, hematoma, or developing abscess. Evaluation is limited without intravenous contrast. Subcutaneous fat stranding throughout the lower anterior abdominal wall may be related to recent cesarean section or cellulitis. No free intraperitoneal fluid or free gas. No abdominal wall hernia. Musculoskeletal: No acute or destructive bony abnormalities. Reconstructed images demonstrate no additional findings. IMPRESSION: 1. Large multilocular fluid collection within the lower anterior abdominal wall along the site of prior cesarean section, which could reflect abscess, seroma, or postoperative hematoma. Sterility of fluid collection cannot be assessed by CT. 2. Diffuse subcutaneous fat stranding throughout the lower anterior abdominal wall, which may reflect postsurgical changes versus cellulitis. Electronically Signed   By: Sharlet Salina M.D.   On: 12/15/2022 18:50    Labs:  CBC: Recent Labs    11/21/22 0501 11/23/22 2258 12/15/22 1653 12/16/22 2117  WBC 9.5 9.1 10.7* 9.4  HGB 9.1* 9.9* 10.7* 11.2*  HCT 29.0* 31.5* 34.1* 35.7*  PLT 228 322 318 339    COAGS: Recent Labs    12/16/22 0731  INR 1.0    BMP: Recent Labs  11/09/22 2200 11/17/22 1035 11/20/22 1431 11/23/22 2258 12/15/22 1653 12/17/22 1234  NA 133* 134*  --  138 140  --   K 3.7 3.9  --  3.7 3.6  --   CL 103 104  --  100 104  --   CO2 19* 21*  --  24 26  --   GLUCOSE 100* 107*  --  131* 101*  --   BUN 8 7  --  10 8  --   CALCIUM 9.0 8.9  --  9.1 8.5*  --   CREATININE 0.52 0.52 0.58 0.65 0.80 0.86  GFRNONAA >60 >60 >60 >60 >60 >60    LIVER FUNCTION TESTS: Recent Labs    11/09/22 2200 11/17/22 1035  11/23/22 2258 12/15/22 1653  BILITOT 0.2* 0.5 0.4 0.5  AST 17 16 31 26   ALT 17 20 27  34  ALKPHOS 140* 149* 136* 91  PROT 6.1* 6.5 6.3* 6.1*  ALBUMIN 2.3* 2.4* 2.6* 3.1*    Drain Location: low abd Size: Fr size: 12 Fr Date of placement: 12/16/22  Currently to: Drain collection device: suction bulb 24 hour output:  Output by Drain (mL) 12/16/22 0701 - 12/16/22 1900 12/16/22 1901 - 12/17/22 0700 12/17/22 0701 - 12/17/22 1900 12/17/22 1901 - 12/18/22 0700 12/18/22 0701 - 12/18/22 0954  Closed System Drain Anterior Hip Bulb (JP) 12 Fr. 85 35 45 60 25    Interval imaging/drain manipulation:  None  Current examination: Flushes/aspirates easily.  Insertion site unremarkable. Suture and stat lock in place. Dressed appropriately.   Plan: Continue TID flushes with 5 cc NS. Record output Q shift. Dressing changes QD or PRN if soiled.  Call IR APP or on call IR MD if difficulty flushing or sudden change in drain output.  Repeat imaging/possible drain injection once output < 10 mL/QD (excluding flush material). Consideration for drain removal if output is < 10 mL/QD (excluding flush material), pending discussion with the providing surgical service.  Discharge planning: Please contact IR APP or on call IR MD prior to patient d/c to ensure appropriate follow up plans are in place. Typically patient will follow up with IR clinic 10-14 days post d/c for repeat imaging/possible drain injection. IR scheduler will contact patient with date/time of appointment. Patient will need to flush drain QD with 5 cc NS, record output QD, dressing changes every 2-3 days or earlier if soiled.   IR will continue to follow - please call with questions or concerns.  Assessment and Plan:  Low abd drain intact OP is good ~100-110 per day Flushes easily; aspirates easily Will need Rx for flushes as OP- or send home with at least 15 flushes; flush once daily at home Record output Pt will hear from IR scheduler  for OP follow up date and time  Electronically Signed: Robet Leu, PA-C 12/18/2022, 9:54 AM   I spent a total of 15 Minutes at the the patient's bedside AND on the patient's hospital floor or unit, greater than 50% of which was counseling/coordinating care for low abd drain

## 2022-12-18 NOTE — Discharge Summary (Signed)
Physician Discharge Summary  Patient ID: Summer Terrell MRN: [id number] DOB/AGE: 1986/02/17 37 y.o.  Admit date: 12/15/2022 Discharge date: 12/18/2022  Admission Diagnoses:wound infection  Discharge Diagnoses:  Principal Problem:   Wound infection following procedure   Discharged Condition: good  Hospital Course: Patient is presenting today for concern for wound infection.She reports that since she had her wound VAC removed she has noticed a clear fluid which has been draining from the wound which has turned yellowish tinge.  She also reports that she notices an odor which "smells like yeast".  Initially the drainage was pinkish in color.  She has been applying antiseptic which she got over-the-counter since yesterday.  She reports minimal pain.  She does appreciate a palpable mass around the incision.  Denies any fever or chills.  Denies any other symptoms.    She had IR drainage of wound fluid collection with serous drainage and no growth at the time of discharge. F/U with radiology in 10-14 days. S/p IV Zosyn and vancomycin, d/c on Bactrim  Consults:  interventional radiology  Significant Diagnostic Studies: microbiology: wound culture: negative and radiology: CT scan: pelvis  Treatments: procedures: percutaneous drainage catheter placement  Discharge Exam: Blood pressure (!) 139/58, pulse 75, temperature 98.1 F (36.7 C), temperature source Oral, resp. rate 18, height 5\' 8"  (1.727 m), weight (!) 149.8 kg, SpO2 99 %, not currently breastfeeding. General appearance: alert, cooperative, and no distress GI: wound well healing with clear drainage in tube Extremities: extremities normal, atraumatic, no cyanosis or edema  Disposition: Discharge disposition: 01-Home or Self Care       Discharge Instructions     Discharge patient   Complete by: As directed    Discharge disposition: 01-Home or Self Care   Discharge patient date: 12/18/2022      Allergies as of 12/18/2022        Reactions   Rocephin [ceftriaxone Sodium In Dextrose] Anaphylaxis, Hives, Shortness Of Breath        Medication List     TAKE these medications    Accu-Chek Guide test strip Generic drug: glucose blood Use as instructed to check blood sugar 4 times daily   Accu-Chek Softclix Lancets lancets Use as instructed to check blood sugar 4 times daily   Acetaminophen Extra Strength 500 MG Tabs Take 2 tablets (1,000 mg total) by mouth every 6 (six) hours.   amLODipine 10 MG tablet Commonly known as: NORVASC Take 1 tablet (10 mg total) by mouth daily.   cyclobenzaprine 10 MG tablet Commonly known as: FLEXERIL Take 1 tablet (10 mg total) by mouth every 8 (eight) hours as needed for muscle spasms.   ferrous sulfate 325 (65 FE) MG tablet Take 1 tablet (325 mg total) by mouth every other day.   furosemide 20 MG tablet Commonly known as: LASIX Take 1 tablet (20 mg total) by mouth daily.   hydrOXYzine 25 MG tablet Commonly known as: ATARAX Take 1 tablet (25 mg total) by mouth 3 (three) times daily as needed for anxiety.   ibuprofen 600 MG tablet Commonly known as: ADVIL Take 1 tablet (600 mg total) by mouth every 6 (six) hours.   Incassia 0.35 MG tablet Generic drug: norethindrone Take 1 tablet (0.35 mg total) by mouth daily.   oxyCODONE 5 MG immediate release tablet Commonly known as: Oxy IR/ROXICODONE Take 1 tablet (5 mg total) by mouth every 4 (four) hours as needed for moderate pain.   oxyCODONE-acetaminophen 5-325 MG tablet Commonly known as: PERCOCET/ROXICET Take 1-2 tablets  by mouth every 6 (six) hours as needed (moderate to severe pain (when tolerating fluids)).   PRENATAL/IRON PO Take 1 tablet by mouth daily.   sertraline 50 MG tablet Commonly known as: Zoloft Take 1/2 tablet x 14 days then increase to 1 tablet daily   silver sulfADIAZINE 1 % cream Commonly known as: SILVADENE Apply 1 Application topically daily.   sulfamethoxazole-trimethoprim 800-160 MG  tablet Commonly known as: BACTRIM DS Take 1 tablet by mouth 2 (two) times daily.        Follow-up Information     [facility] Follow up.   Why: Interventional Radiology scheduler will call you to setup follow up appointment - typically 10-14 days after your procedure. If you have questions or concerns call [phone] Monday-Friday between 8a-5p OR [phone] at any time to be connected with an on call radiologist. Contact information: [address] [place] [address] [phone]         [facility] Follow up in 1 week(s).   Specialty: Obstetrics and Gynecology Contact information: [address] [place] [address] [phone]                Signed: Scheryl Darter 12/18/2022, 4:26 PM

## 2022-12-18 NOTE — Progress Notes (Signed)
POD 28 after cesarean section, Day 2 drain placed  Subjective: Patient reports tolerating PO and no problems voiding.    Objective: I have reviewed patient's vital signs, medications, and radiology results. Blood pressure (!) 121/42, pulse 68, temperature 97.9 F (36.6 C), temperature source Oral, resp. rate 18, height 5\' 8"  (1.727 m), weight (!) 149.8 kg, SpO2 98 %, not currently breastfeeding.  General: alert, cooperative, and no distress Extremities: extremities normal, atraumatic, no cyanosis or edema Incision dry, drain with scant serous fluid Assessment: s/p IR drain placement: stable and progressing well  Plan: Encourage ambulation, culture pending  LOS: 3 days    Scheryl Darter, MD 12/18/2022, 11:47 AM

## 2022-12-19 LAB — BODY FLUID CULTURE W GRAM STAIN: Culture: NO GROWTH

## 2022-12-20 ENCOUNTER — Other Ambulatory Visit: Payer: Self-pay | Admitting: Obstetrics & Gynecology

## 2022-12-20 DIAGNOSIS — R188 Other ascites: Secondary | ICD-10-CM

## 2022-12-25 ENCOUNTER — Ambulatory Visit (INDEPENDENT_AMBULATORY_CARE_PROVIDER_SITE_OTHER): Payer: Medicaid Other | Admitting: Women's Health

## 2022-12-25 ENCOUNTER — Encounter: Payer: Self-pay | Admitting: Women's Health

## 2022-12-25 DIAGNOSIS — Z98891 History of uterine scar from previous surgery: Secondary | ICD-10-CM | POA: Diagnosis not present

## 2022-12-25 DIAGNOSIS — T8149XA Infection following a procedure, other surgical site, initial encounter: Secondary | ICD-10-CM

## 2022-12-25 DIAGNOSIS — Z8632 Personal history of gestational diabetes: Secondary | ICD-10-CM

## 2022-12-25 DIAGNOSIS — O09299 Supervision of pregnancy with other poor reproductive or obstetric history, unspecified trimester: Secondary | ICD-10-CM

## 2022-12-25 MED ORDER — AMOXICILLIN-POT CLAVULANATE 875-125 MG PO TABS: 1.0000 | ORAL_TABLET | Freq: Two times a day (BID) | ORAL | 0 refills | Status: AC

## 2022-12-25 MED ORDER — SLYND 4 MG PO TABS: 1.0000 | ORAL_TABLET | Freq: Every day | ORAL | 3 refills | Status: AC

## 2022-12-25 NOTE — Progress Notes (Signed)
POSTPARTUM VISIT Patient name: Summer Terrell MRN [id number]  Date of birth: [birth year] Chief Complaint:   Postpartum Care  History of Present Illness:   Summer Terrell is a 37 y.o. [phone] Caucasian female being seen today for a postpartum visit. She is 5 weeks postpartum following a repeat cesarean section, low transverse incision at 37.0 gestational weeks d/t pre-e and A2DM. IOL: no, for n/a. Anesthesia: spinal.  Laceration: n/a.  Complications: none. Inpatient contraception: no.   Pregnancy complicated by AMA, A2DM, pre-e, BMI >40 . Tobacco use: no. Substance use disorder: no. Last pap smear: 08/02/20 and results were  neg in [place] . Next pap smear due: 2025 Patient's last menstrual period was 03/06/2022 (approximate).  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 Readmitted 6/21-6/24 for incisional infection, had IR drainage of wound fluid collection w/ serous drainage and no growth on culture, s/p IV zosyn and vancomycin. D/C'd on bactrim which she did not start b/c culture was negative. Has JP drain, she emptied 10cc prior to coming today, has been serosangineous. Has red area Rt side of incision she thinks is yeast . Bleeding none. Bowel function is normal. Bladder function is normal. Urinary incontinence? no, fecal incontinence? no Patient is not sexually active. Last sexual activity: prior to birth of baby. Desired contraception: POPs. Patient does not know want a pregnancy in the future.  Desired family size is uncertain #of children.   Upstream - 12/25/22 1156       Pregnancy Intention Screening   Does the patient want to become pregnant in the next year? No    Does the patient's partner want to become pregnant in the next year? No    Would the patient like to discuss contraceptive options today? No      Contraception Wrap Up   Current Method Oral Contraceptive    End Method Oral Contraceptive    Contraception  Counseling Provided No            The pregnancy intention screening data noted above was reviewed. Potential methods of contraception were discussed. The patient elected to proceed with Oral Contraceptive.  Edinburgh Postpartum Depression Screening: negative  Edinburgh Postnatal Depression Scale - 12/25/22 1154       Edinburgh Postnatal Depression Scale:  In the Past 7 Days   I have been able to laugh and see the funny side of things. 0    I have looked forward with enjoyment to things. 0    I have blamed myself unnecessarily when things went wrong. 0    I have been anxious or worried for no good reason. 3    I have felt scared or panicky for no good reason. 2    Things have been getting on top of me. 1    I have been so unhappy that I have had difficulty sleeping. 1    I have felt sad or miserable. 0    I have been so unhappy that I have been crying. 1    The thought of harming myself has occurred to me. 0    Edinburgh Postnatal Depression Scale Total 8                05/29/2022    9:48 AM 02/04/2021    8:53 AM 11/04/2020   10:21 AM 09/24/2020   12:18 PM  GAD 7 : Generalized Anxiety Score  Nervous, Anxious, on Edge 2 1 1  1  Control/stop worrying 1 1 1 1   Worry too much - different things 1 1 1 1   Trouble relaxing 1 1 1 1   Restless 0 0 0 0  Easily annoyed or irritable 1 1 1 1   Afraid - awful might happen 0 0 0 0  Total GAD 7 Score 6 5 5 5      Baby's course has been uncomplicated. Baby is feeding by bottle. Infant has a pediatrician/family doctor? Yes.  Childcare strategy if returning to work/school: family.  Pt has material needs met for her and baby: Yes.   Review of Systems:   Pertinent items are noted in HPI Denies Abnormal vaginal discharge w/ itching/odor/irritation, headaches, visual changes, shortness of breath, chest pain, abdominal pain, severe nausea/vomiting, or problems with urination or bowel movements. Pertinent History Reviewed:  Reviewed past  medical,surgical, obstetrical and family history.  Reviewed problem list, medications and allergies. OB History  Gravida Para Term Preterm AB Living  4 3 3   1 3   SAB IAB Ectopic Multiple Live Births  1     0 3    # Outcome Date GA Lbr Len/2nd Weight Sex Delivery Anes PTL Lv  4 Term 11/20/22 [redacted]w[redacted]d  6 lb 11.2 oz (3.04 kg) F CS-LTranv Spinal  LIV  3 Term 04/17/21 [redacted]w[redacted]d  7 lb 4.1 oz (3.29 kg) M CS-LTranv EPI  LIV  2 SAB 2016          1 Term 11/27/11 [redacted]w[redacted]d  5 lb 4 oz (2.381 kg) F CS-LTranv EPI N LIV     Complications: Fetal Intolerance   Physical Assessment:   Vitals:   12/25/22 1144  BP: 136/87  Pulse: 80  Weight: (!) 324 lb 9.6 oz (147.2 kg)  Height: 5\' 8"  (1.727 m)  Body mass index is 49.36 kg/m.       Physical Examination:   General appearance: alert, well appearing, and in no distress  Mental status: alert, oriented to person, place, and time  Skin: warm & dry   Cardiovascular: normal heart rate noted   Respiratory: normal respiratory effort, no distress   Breasts: deferred, no complaints   Abdomen: soft, non-tender, J{ drain intact Lt abdomen, scant  amt serosangineous drainage, C/S incision erythematous on Rt side, subcutaneous sutures visible, Lt side wnl. Dr. Despina Hidden already left office- called on video call so he could see incision, advised to paint entire incision w/ gentian violet and rx augmentin bid x 10d in case of cellulitis and see him back this Fri.   Pelvic: examination not indicated. Thin prep pap obtained: No  Rectal: not examined  Extremities: Edema: none   Chaperone: N/A         No results found for this or any previous visit (from the past 24 hour(s)).  Assessment & Plan:  1) Postpartum exam 2) 5 wks s/p repeat cesarean section, low transverse incision 3) bottle feeding 4) Depression screening 5) Contraception management: rx Slynd to [facility] (prefers POPs over COCs)   6) Incisional candida vs cellulitis> s/p IV zosyn and vanc last week, painted w/  gentian violet today, rx augmentin bid x 10d per Dr. Despina Hidden (pt has taken w/o any issues in past per her report), f/u on Fri to see him. F/U w/ IR on 7/8 as scheduled 7) PPHTN> stable off of amlodipine, will recheck bp on Friday 8) A2DM> 2hr pp GTT in 3wks  Essential components of care per ACOG recommendations:  1.  Mood and well being:  If positive depression screen,  discussed and plan developed.  If using tobacco we discussed reduction/cessation and risk of relapse If current substance abuse, we discussed and referral to local resources was offered.   2. Infant care and feeding:  If breastfeeding, discussed returning to work, pumping, breastfeeding-associated pain, guidance regarding return to fertility while lactating if not using another method. If needed, patient was provided with a letter to be allowed to pump q 2-3hrs to support lactation in a private location with access to a refrigerator to store breastmilk.   Recommended that all caregivers be immunized for flu, pertussis and other preventable communicable diseases If pt does not have material needs met for her/baby, referred to local resources for help obtaining these.  3. Sexuality, contraception and birth spacing Provided guidance regarding sexuality, management of dyspareunia, and resumption of intercourse Discussed avoiding interpregnancy interval <36mths and recommended birth spacing of 18 months  4. Sleep and fatigue Discussed coping options for fatigue and sleep disruption Encouraged family/partner/community support of 4 hrs of uninterrupted sleep to help with mood and fatigue  5. Physical recovery  If pt had a C/S, assessed incisional pain and providing guidance on normal vs prolonged recovery If pt had a laceration, perineal healing and pain reviewed.  If urinary or fecal incontinence, discussed management and referred to PT or uro/gyn if indicated  Patient is not safe to resume physical activity. Discussed attainment of  healthy weight.  6.  Chronic disease management Discussed pregnancy complications if any, and their implications for future childbearing and long-term maternal health. Review recommendations for prevention of recurrent pregnancy complications, such as 17 hydroxyprogesterone caproate to reduce risk for recurrent PTB not applicable, or aspirin to reduce risk of preeclampsia yes. Pt had GDM: yes. If yes, 2hr GTT scheduled: yes. Reviewed medications and non-pregnant dosing including consideration of whether pt is breastfeeding using a reliable resource such as LactMed: not applicable Referred for f/u w/ PCP or subspecialist providers as indicated: yes  7. Health maintenance Mammogram at 36yo or earlier if indicated Pap smears as indicated  Meds:  Meds ordered this encounter  Medications   amoxicillin-clavulanate (AUGMENTIN) 875-125 MG tablet    Sig: Take 1 tablet by mouth 2 (two) times daily.    Dispense:  20 tablet    Refill:  0   Drospirenone (SLYND) 4 MG TABS    Sig: Take 1 tablet (4 mg total) by mouth daily.    Dispense:  90 tablet    Refill:  3    Follow-up: Return for friday Dr. Despina Hidden only; 3wks for 2hr sugar test.   No orders of the defined types were placed in this encounter.   Cheral Marker CNM, [facility] 12/25/2022 1:02 PM

## 2022-12-29 ENCOUNTER — Ambulatory Visit (INDEPENDENT_AMBULATORY_CARE_PROVIDER_SITE_OTHER): Payer: Medicaid Other | Admitting: Obstetrics & Gynecology

## 2022-12-29 ENCOUNTER — Encounter: Payer: Self-pay | Admitting: Obstetrics & Gynecology

## 2022-12-29 VITALS — BP 141/81 | HR 73

## 2022-12-29 DIAGNOSIS — O9089 Other complications of the puerperium, not elsewhere classified: Secondary | ICD-10-CM

## 2022-12-29 MED ORDER — IBUPROFEN 800 MG PO TABS: 800.0000 mg | ORAL_TABLET | Freq: Three times a day (TID) | ORAL | 0 refills | Status: AC | PRN

## 2022-12-29 MED ORDER — FUROSEMIDE 40 MG PO TABS: 40.0000 mg | ORAL_TABLET | Freq: Two times a day (BID) | ORAL | 1 refills | Status: AC

## 2022-12-29 NOTE — Progress Notes (Signed)
  HPI: Patient returns for routine postoperative follow-up having undergone 11/20/22 on repeat C section.  The patient's immediate postoperative recovery has been unremarkable. Since hospital discharge the patient reports post op complication of delayed wound seroma.   Current Outpatient Medications: amoxicillin-clavulanate (AUGMENTIN) 875-125 MG tablet, Take 1 tablet by mouth 2 (two) times daily., Disp: 20 tablet, Rfl: 0 hydrOXYzine (ATARAX) 25 MG tablet, Take 1 tablet (25 mg total) by mouth 3 (three) times daily as needed for anxiety., Disp: 30 tablet, Rfl: 3 ibuprofen (ADVIL) 600 MG tablet, Take 1 tablet (600 mg total) by mouth every 6 (six) hours., Disp: 30 tablet, Rfl: 0 oxyCODONE-acetaminophen (PERCOCET/ROXICET) 5-325 MG tablet, Take 1-2 tablets by mouth every 6 (six) hours as needed (moderate to severe pain (when tolerating fluids))., Disp: 12 tablet, Rfl: 0 sertraline (ZOLOFT) 50 MG tablet, Take 1/2 tablet x 14 days then increase to 1 tablet daily, Disp: 30 tablet, Rfl: 11 acetaminophen (TYLENOL) 500 MG tablet, Take 2 tablets (1,000 mg total) by mouth every 6 (six) hours. (Patient not taking: Reported on 12/25/2022), Disp: 100 tablet, Rfl: 0 amLODipine (NORVASC) 10 MG tablet, Take 1 tablet (10 mg total) by mouth daily. (Patient not taking: Reported on 12/25/2022), Disp: 90 tablet, Rfl: 1 cyclobenzaprine (FLEXERIL) 10 MG tablet, Take 1 tablet (10 mg total) by mouth every 8 (eight) hours as needed for muscle spasms. (Patient not taking: Reported on 11/28/2022), Disp: 30 tablet, Rfl: 0 Drospirenone (SLYND) 4 MG TABS, Take 1 tablet (4 mg total) by mouth daily. (Patient not taking: Reported on 12/29/2022), Disp: 90 tablet, Rfl: 3 ferrous sulfate 325 (65 FE) MG tablet, Take 1 tablet (325 mg total) by mouth every other day. (Patient not taking: Reported on 11/28/2022), Disp: 45 tablet, Rfl: 2 oxyCODONE (OXY IR/ROXICODONE) 5 MG immediate release tablet, Take 1 tablet (5 mg total) by mouth every 4 (four) hours  as needed for moderate pain. (Patient not taking: Reported on 12/05/2022), Disp: 20 tablet, Rfl: 0 Prenatal Multivit-Min-Fe-FA (PRENATAL/IRON PO), Take 1 tablet by mouth daily. (Patient not taking: Reported on 11/28/2022), Disp: , Rfl:  silver sulfADIAZINE (SILVADENE) 1 % cream, Apply 1 Application topically daily. (Patient not taking: Reported on 12/25/2022), Disp: 50 g, Rfl: 0 sulfamethoxazole-trimethoprim (BACTRIM DS) 800-160 MG tablet, Take 1 tablet by mouth 2 (two) times daily. (Patient not taking: Reported on 12/25/2022), Disp: 14 tablet, Rfl: 1  No current facility-administered medications for this visit.    Blood pressure (!) 141/81, pulse 73, not currently breastfeeding.  Physical Exam: Normal abdominal exam  Incision looks clean dry intact  Diagnostic Tests:   Pathology:   Impression + Management plan: Post op wound seroma    Medications Prescribed this encounter: Meds ordered this encounter  Medications   ibuprofen (ADVIL) 800 MG tablet    Sig: Take 1 tablet (800 mg total) by mouth every 8 (eight) hours as needed.    Dispense:  30 tablet    Refill:  0   furosemide (LASIX) 40 MG tablet    Sig: Take 1 tablet (40 mg total) by mouth 2 (two) times daily.    Dispense:  14 tablet    Refill:  1        Follow up: Keep her IR appt See [place] in 1 week   Lazaro Arms, MD Attending Physician for the [facility] and [facility] 12/29/2022 10:23 AM

## 2023-01-01 ENCOUNTER — Ambulatory Visit
Admission: RE | Admit: 2023-01-01 | Discharge: 2023-01-01 | Disposition: A | Discharge status: Home / Self Care | Payer: Medicaid Other | Source: Ambulatory Visit | Attending: Obstetrics & Gynecology | Admitting: Obstetrics & Gynecology

## 2023-01-01 ENCOUNTER — Ambulatory Visit
Admission: RE | Admit: 2023-01-01 | Discharge: 2023-01-01 | Disposition: A | Discharge status: Home / Self Care | Payer: Medicaid Other | Source: Ambulatory Visit | Attending: Physician Assistant | Admitting: Physician Assistant

## 2023-01-01 DIAGNOSIS — R188 Other ascites: Secondary | ICD-10-CM

## 2023-01-01 HISTORY — PX: IR RADIOLOGIST EVAL & MGMT: IMG5224

## 2023-01-01 MED ORDER — IOPAMIDOL (ISOVUE-300) INJECTION 61%
100.0000 mL | Freq: Once | INTRAVENOUS | Status: AC | PRN
  Administered 2023-01-01: 100 mL via INTRAVENOUS
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
## 2023-01-04 ENCOUNTER — Ambulatory Visit (INDEPENDENT_AMBULATORY_CARE_PROVIDER_SITE_OTHER): Payer: Medicaid Other | Admitting: Obstetrics & Gynecology

## 2023-01-04 ENCOUNTER — Encounter: Payer: Self-pay | Admitting: Obstetrics & Gynecology

## 2023-01-04 VITALS — BP 136/92 | HR 76 | Ht 68.0 in | Wt 322.0 lb

## 2023-01-04 DIAGNOSIS — Z98891 History of uterine scar from previous surgery: Secondary | ICD-10-CM

## 2023-01-04 NOTE — Progress Notes (Signed)
  HPI: Patient returns for routine postoperative follow-up having undergone 11/20/22 on repeat C section.  The patient's immediate postoperative recovery has been unremarkable. Since hospital discharge the patient reports post op complication of delayed wound seroma.   Current Outpatient Medications: amoxicillin-clavulanate (AUGMENTIN) 875-125 MG tablet, Take 1 tablet by mouth 2 (two) times daily., Disp: 20 tablet, Rfl: 0 hydrOXYzine (ATARAX) 25 MG tablet, Take 1 tablet (25 mg total) by mouth 3 (three) times daily as needed for anxiety., Disp: 30 tablet, Rfl: 3 ibuprofen (ADVIL) 600 MG tablet, Take 1 tablet (600 mg total) by mouth every 6 (six) hours., Disp: 30 tablet, Rfl: 0 oxyCODONE-acetaminophen (PERCOCET/ROXICET) 5-325 MG tablet, Take 1-2 tablets by mouth every 6 (six) hours as needed (moderate to severe pain (when tolerating fluids))., Disp: 12 tablet, Rfl: 0 sertraline (ZOLOFT) 50 MG tablet, Take 1/2 tablet x 14 days then increase to 1 tablet daily, Disp: 30 tablet, Rfl: 11 acetaminophen (TYLENOL) 500 MG tablet, Take 2 tablets (1,000 mg total) by mouth every 6 (six) hours. (Patient not taking: Reported on 12/25/2022), Disp: 100 tablet, Rfl: 0 amLODipine (NORVASC) 10 MG tablet, Take 1 tablet (10 mg total) by mouth daily. (Patient not taking: Reported on 12/25/2022), Disp: 90 tablet, Rfl: 1 cyclobenzaprine (FLEXERIL) 10 MG tablet, Take 1 tablet (10 mg total) by mouth every 8 (eight) hours as needed for muscle spasms. (Patient not taking: Reported on 11/28/2022), Disp: 30 tablet, Rfl: 0 Drospirenone (SLYND) 4 MG TABS, Take 1 tablet (4 mg total) by mouth daily. (Patient not taking: Reported on 12/29/2022), Disp: 90 tablet, Rfl: 3 ferrous sulfate 325 (65 FE) MG tablet, Take 1 tablet (325 mg total) by mouth every other day. (Patient not taking: Reported on 11/28/2022), Disp: 45 tablet, Rfl: 2 oxyCODONE (OXY IR/ROXICODONE) 5 MG immediate release tablet, Take 1 tablet (5 mg total) by mouth every 4 (four) hours  as needed for moderate pain. (Patient not taking: Reported on 12/05/2022), Disp: 20 tablet, Rfl: 0 Prenatal Multivit-Min-Fe-FA (PRENATAL/IRON PO), Take 1 tablet by mouth daily. (Patient not taking: Reported on 11/28/2022), Disp: , Rfl:  silver sulfADIAZINE (SILVADENE) 1 % cream, Apply 1 Application topically daily. (Patient not taking: Reported on 12/25/2022), Disp: 50 g, Rfl: 0 sulfamethoxazole-trimethoprim (BACTRIM DS) 800-160 MG tablet, Take 1 tablet by mouth 2 (two) times daily. (Patient not taking: Reported on 12/25/2022), Disp: 14 tablet, Rfl: 1  No current facility-administered medications for this visit.    Blood pressure (!) 141/81, pulse 73, not currently breastfeeding.  Physical Exam: Normal abdominal exam  Incision looks clean dry intact  Diagnostic Tests:   Pathology:   Impression + Management plan: Post op wound seroma    Medications Prescribed this encounter: No orders of the defined types were placed in this encounter.       Follow up: prn 1 year  Lazaro Arms, MD Attending Physician for the [facility] and [facility] 12/29/2022 10:23 AM

## 2023-01-15 ENCOUNTER — Other Ambulatory Visit: Payer: Medicaid Other

## 2023-01-15 DIAGNOSIS — Z8632 Personal history of gestational diabetes: Secondary | ICD-10-CM

## 2023-01-15 DIAGNOSIS — Z131 Encounter for screening for diabetes mellitus: Secondary | ICD-10-CM

## 2024-07-01 ENCOUNTER — Ambulatory Visit: Payer: Self-pay

## 2024-07-01 ENCOUNTER — Other Ambulatory Visit (HOSPITAL_COMMUNITY)
Admission: RE | Admit: 2024-07-01 | Discharge: 2024-07-01 | Disposition: A | Discharge status: Home / Self Care | Source: Ambulatory Visit | Attending: Obstetrics & Gynecology | Admitting: Obstetrics & Gynecology

## 2024-07-01 DIAGNOSIS — Z113 Encounter for screening for infections with a predominantly sexual mode of transmission: Secondary | ICD-10-CM | POA: Insufficient documentation

## 2024-07-01 DIAGNOSIS — Z9189 Other specified personal risk factors, not elsewhere classified: Secondary | ICD-10-CM | POA: Insufficient documentation

## 2024-07-01 NOTE — Progress Notes (Signed)
" ° °  NURSE VISIT- STD  SUBJECTIVE:  Summer Terrell is a 39 y.o. [id number] GYN patientfemale here for a vaginal swab for STD screening.  She reports the following symptoms: none . Denies abnormal vaginal bleeding, significant pelvic pain, fever, or UTI symptoms.  OBJECTIVE:  There were no vitals taken for this visit.  Appears well, in no apparent distress  ASSESSMENT: Vaginal swab for STD screen HIV and RPR drawn as well PLAN: Self-collected vaginal probe for Gonorrhea, Chlamydia, Trichomonas, Bacterial Vaginosis, Yeast sent to lab Patient also sent to have HIV and RPR drawn Treatment: to be determined once results are received Follow-up as needed if symptoms persist/worsen, or new symptoms develop  Aleck FORBES Blase  07/01/2024 11:31 AM  "

## 2024-07-02 ENCOUNTER — Ambulatory Visit: Payer: Self-pay | Admitting: Adult Health

## 2024-07-02 LAB — CERVICOVAGINAL ANCILLARY ONLY
Bacterial Vaginitis (gardnerella): POSITIVE — AB
Candida Glabrata: NEGATIVE
Candida Vaginitis: NEGATIVE
Chlamydia: NEGATIVE
Comment: NEGATIVE
Comment: NEGATIVE
Comment: NEGATIVE
Comment: NEGATIVE
Comment: NEGATIVE
Comment: NORMAL
Neisseria Gonorrhea: NEGATIVE
Trichomonas: NEGATIVE

## 2024-07-02 LAB — SYPHILIS: RPR W/REFLEX TO RPR TITER AND TREPONEMAL ANTIBODIES, TRADITIONAL SCREENING AND DIAGNOSIS ALGORITHM: RPR Ser Ql: NONREACTIVE

## 2024-07-02 LAB — HIV ANTIBODY (ROUTINE TESTING W REFLEX): HIV Screen 4th Generation wRfx: NONREACTIVE

## 2024-07-03 ENCOUNTER — Encounter: Payer: Self-pay | Admitting: Women's Health

## 2024-07-03 MED ORDER — METRONIDAZOLE 500 MG PO TABS: 500.0000 mg | ORAL_TABLET | Freq: Two times a day (BID) | ORAL | 0 refills | Status: AC

## 2024-07-04 NOTE — Telephone Encounter (Signed)
 Do not take any more flagyl  and hoepfully the benadryl  helped with the itching, give me a call on Monday.

## 2024-08-05 ENCOUNTER — Ambulatory Visit: Payer: Self-pay | Admitting: Women's Health
